# Patient Record
Sex: Female | Born: 1950 | Race: White | Hispanic: No | Marital: Married | Smoking: Former smoker
Health system: Southern US, Community
[De-identification: ages and names within clinical notes are randomized; demographics above are authoritative.]

## PROBLEM LIST (undated history)

## (undated) DIAGNOSIS — M545 Low back pain, unspecified: Secondary | ICD-10-CM

## (undated) DIAGNOSIS — E7800 Pure hypercholesterolemia, unspecified: Secondary | ICD-10-CM

## (undated) DIAGNOSIS — F319 Bipolar disorder, unspecified: Secondary | ICD-10-CM

## (undated) DIAGNOSIS — T8859XA Other complications of anesthesia, initial encounter: Secondary | ICD-10-CM

## (undated) DIAGNOSIS — Z720 Tobacco use: Secondary | ICD-10-CM

## (undated) DIAGNOSIS — R0602 Shortness of breath: Secondary | ICD-10-CM

## (undated) DIAGNOSIS — G43909 Migraine, unspecified, not intractable, without status migrainosus: Secondary | ICD-10-CM

## (undated) DIAGNOSIS — Z9889 Other specified postprocedural states: Secondary | ICD-10-CM

## (undated) DIAGNOSIS — R112 Nausea with vomiting, unspecified: Secondary | ICD-10-CM

## (undated) DIAGNOSIS — T7840XA Allergy, unspecified, initial encounter: Secondary | ICD-10-CM

## (undated) DIAGNOSIS — H269 Unspecified cataract: Secondary | ICD-10-CM

## (undated) DIAGNOSIS — F32A Depression, unspecified: Secondary | ICD-10-CM

## (undated) DIAGNOSIS — IMO0001 Reserved for inherently not codable concepts without codable children: Secondary | ICD-10-CM

## (undated) DIAGNOSIS — Z5189 Encounter for other specified aftercare: Secondary | ICD-10-CM

## (undated) DIAGNOSIS — K219 Gastro-esophageal reflux disease without esophagitis: Secondary | ICD-10-CM

## (undated) DIAGNOSIS — C801 Malignant (primary) neoplasm, unspecified: Secondary | ICD-10-CM

## (undated) DIAGNOSIS — F329 Major depressive disorder, single episode, unspecified: Secondary | ICD-10-CM

## (undated) DIAGNOSIS — Z923 Personal history of irradiation: Secondary | ICD-10-CM

## (undated) DIAGNOSIS — J449 Chronic obstructive pulmonary disease, unspecified: Secondary | ICD-10-CM

## (undated) DIAGNOSIS — M1990 Unspecified osteoarthritis, unspecified site: Secondary | ICD-10-CM

## (undated) DIAGNOSIS — S42202A Unspecified fracture of upper end of left humerus, initial encounter for closed fracture: Secondary | ICD-10-CM

## (undated) DIAGNOSIS — J45909 Unspecified asthma, uncomplicated: Secondary | ICD-10-CM

## (undated) DIAGNOSIS — C3490 Malignant neoplasm of unspecified part of unspecified bronchus or lung: Secondary | ICD-10-CM

## (undated) DIAGNOSIS — E785 Hyperlipidemia, unspecified: Secondary | ICD-10-CM

## (undated) DIAGNOSIS — F419 Anxiety disorder, unspecified: Secondary | ICD-10-CM

## (undated) DIAGNOSIS — T4145XA Adverse effect of unspecified anesthetic, initial encounter: Secondary | ICD-10-CM

## (undated) HISTORY — DX: Anxiety disorder, unspecified: F41.9

## (undated) HISTORY — PX: VESICOVAGINAL FISTULA CLOSURE W/ TAH: SUR271

## (undated) HISTORY — DX: Depression, unspecified: F32.A

## (undated) HISTORY — DX: Low back pain, unspecified: M54.50

## (undated) HISTORY — PX: LUMBAR FUSION: SHX111

## (undated) HISTORY — DX: Bipolar disorder, unspecified: F31.9

## (undated) HISTORY — DX: Low back pain: M54.5

## (undated) HISTORY — PX: APPENDECTOMY: SHX54

## (undated) HISTORY — DX: Chronic obstructive pulmonary disease, unspecified: J44.9

## (undated) HISTORY — DX: Pure hypercholesterolemia, unspecified: E78.00

## (undated) HISTORY — DX: Migraine, unspecified, not intractable, without status migrainosus: G43.909

## (undated) HISTORY — DX: Tobacco use: Z72.0

## (undated) HISTORY — PX: TONSILLECTOMY: SUR1361

## (undated) HISTORY — PX: BACK SURGERY: SHX140

## (undated) HISTORY — DX: Hyperlipidemia, unspecified: E78.5

## (undated) HISTORY — DX: Malignant neoplasm of unspecified part of unspecified bronchus or lung: C34.90

## (undated) HISTORY — PX: ABDOMINAL HYSTERECTOMY: SHX81

## (undated) HISTORY — DX: Reserved for inherently not codable concepts without codable children: IMO0001

## (undated) HISTORY — DX: Major depressive disorder, single episode, unspecified: F32.9

---

## 1998-01-09 ENCOUNTER — Ambulatory Visit (HOSPITAL_COMMUNITY): Admission: RE | Admit: 1998-01-09 | Discharge: 1998-01-09 | Payer: Self-pay | Admitting: Family Medicine

## 1998-01-25 ENCOUNTER — Inpatient Hospital Stay (HOSPITAL_COMMUNITY): Admission: AD | Admit: 1998-01-25 | Discharge: 1998-02-01 | Payer: Self-pay | Admitting: *Deleted

## 1998-07-04 ENCOUNTER — Encounter: Payer: Self-pay | Admitting: Family Medicine

## 1998-07-04 ENCOUNTER — Ambulatory Visit (HOSPITAL_COMMUNITY): Admission: RE | Admit: 1998-07-04 | Discharge: 1998-07-04 | Payer: Self-pay | Admitting: Family Medicine

## 1998-09-25 ENCOUNTER — Encounter: Payer: Self-pay | Admitting: Orthopedic Surgery

## 1998-09-27 ENCOUNTER — Encounter: Payer: Self-pay | Admitting: Orthopedic Surgery

## 1998-09-27 ENCOUNTER — Observation Stay (HOSPITAL_COMMUNITY): Admission: RE | Admit: 1998-09-27 | Discharge: 1998-09-29 | Payer: Self-pay | Admitting: Orthopedic Surgery

## 1999-02-12 ENCOUNTER — Encounter: Payer: Self-pay | Admitting: Orthopedic Surgery

## 1999-02-13 ENCOUNTER — Inpatient Hospital Stay (HOSPITAL_COMMUNITY): Admission: EM | Admit: 1999-02-13 | Discharge: 1999-02-14 | Payer: Self-pay | Admitting: Orthopedic Surgery

## 1999-06-10 ENCOUNTER — Encounter: Payer: Self-pay | Admitting: Orthopedic Surgery

## 1999-06-10 ENCOUNTER — Ambulatory Visit (HOSPITAL_COMMUNITY): Admission: RE | Admit: 1999-06-10 | Discharge: 1999-06-10 | Payer: Self-pay | Admitting: Orthopedic Surgery

## 1999-09-18 ENCOUNTER — Ambulatory Visit (HOSPITAL_COMMUNITY): Admission: RE | Admit: 1999-09-18 | Discharge: 1999-09-18 | Payer: Self-pay | Admitting: Family Medicine

## 1999-09-18 ENCOUNTER — Encounter: Payer: Self-pay | Admitting: Family Medicine

## 2000-07-15 ENCOUNTER — Ambulatory Visit (HOSPITAL_COMMUNITY): Admission: RE | Admit: 2000-07-15 | Discharge: 2000-07-15 | Payer: Self-pay | Admitting: Family Medicine

## 2000-07-15 ENCOUNTER — Encounter: Payer: Self-pay | Admitting: Family Medicine

## 2000-07-16 ENCOUNTER — Encounter: Admission: RE | Admit: 2000-07-16 | Discharge: 2000-07-16 | Payer: Self-pay | Admitting: Family Medicine

## 2000-07-16 ENCOUNTER — Encounter: Payer: Self-pay | Admitting: Family Medicine

## 2001-03-23 ENCOUNTER — Encounter: Payer: Self-pay | Admitting: Physical Medicine and Rehabilitation

## 2001-03-23 ENCOUNTER — Encounter
Admission: RE | Admit: 2001-03-23 | Discharge: 2001-03-23 | Payer: Self-pay | Admitting: Physical Medicine and Rehabilitation

## 2001-04-15 ENCOUNTER — Ambulatory Visit: Admission: RE | Admit: 2001-04-15 | Discharge: 2001-04-15 | Payer: Self-pay | Admitting: Orthopaedic Surgery

## 2001-04-15 ENCOUNTER — Encounter: Payer: Self-pay | Admitting: Orthopaedic Surgery

## 2001-04-22 ENCOUNTER — Inpatient Hospital Stay (HOSPITAL_COMMUNITY): Admission: RE | Admit: 2001-04-22 | Discharge: 2001-05-03 | Payer: Self-pay | Admitting: Orthopaedic Surgery

## 2001-04-22 ENCOUNTER — Encounter: Payer: Self-pay | Admitting: Orthopaedic Surgery

## 2001-04-23 ENCOUNTER — Encounter: Payer: Self-pay | Admitting: Internal Medicine

## 2001-04-24 ENCOUNTER — Encounter: Payer: Self-pay | Admitting: Internal Medicine

## 2001-04-26 ENCOUNTER — Encounter: Payer: Self-pay | Admitting: Orthopaedic Surgery

## 2001-04-27 ENCOUNTER — Encounter: Payer: Self-pay | Admitting: Orthopaedic Surgery

## 2005-08-04 ENCOUNTER — Ambulatory Visit (HOSPITAL_COMMUNITY): Admission: RE | Admit: 2005-08-04 | Discharge: 2005-08-04 | Payer: Self-pay | Admitting: Family Medicine

## 2005-09-09 ENCOUNTER — Encounter: Admission: RE | Admit: 2005-09-09 | Discharge: 2005-09-09 | Payer: Self-pay | Admitting: Orthopaedic Surgery

## 2006-06-07 ENCOUNTER — Inpatient Hospital Stay (HOSPITAL_COMMUNITY): Admission: EM | Admit: 2006-06-07 | Discharge: 2006-06-12 | Payer: Self-pay | Admitting: Emergency Medicine

## 2007-05-13 ENCOUNTER — Emergency Department (HOSPITAL_COMMUNITY): Admission: EM | Admit: 2007-05-13 | Discharge: 2007-05-14 | Payer: Self-pay | Admitting: Podiatry

## 2009-01-11 ENCOUNTER — Encounter: Admission: RE | Admit: 2009-01-11 | Discharge: 2009-01-11 | Payer: Self-pay | Admitting: Orthopaedic Surgery

## 2009-06-29 HISTORY — PX: LUNG BIOPSY: SHX232

## 2009-06-29 HISTORY — PX: BRONCHOSCOPY: SUR163

## 2009-11-28 ENCOUNTER — Encounter: Payer: Self-pay | Admitting: Internal Medicine

## 2009-12-04 ENCOUNTER — Encounter: Payer: Self-pay | Admitting: Internal Medicine

## 2009-12-04 ENCOUNTER — Encounter: Admission: RE | Admit: 2009-12-04 | Discharge: 2009-12-04 | Payer: Self-pay | Admitting: Family Medicine

## 2009-12-06 DIAGNOSIS — L509 Urticaria, unspecified: Secondary | ICD-10-CM | POA: Insufficient documentation

## 2009-12-06 DIAGNOSIS — M545 Low back pain, unspecified: Secondary | ICD-10-CM | POA: Insufficient documentation

## 2009-12-06 DIAGNOSIS — F411 Generalized anxiety disorder: Secondary | ICD-10-CM | POA: Insufficient documentation

## 2009-12-06 DIAGNOSIS — F339 Major depressive disorder, recurrent, unspecified: Secondary | ICD-10-CM | POA: Insufficient documentation

## 2009-12-06 DIAGNOSIS — J441 Chronic obstructive pulmonary disease with (acute) exacerbation: Secondary | ICD-10-CM

## 2009-12-06 DIAGNOSIS — IMO0001 Reserved for inherently not codable concepts without codable children: Secondary | ICD-10-CM | POA: Insufficient documentation

## 2009-12-06 DIAGNOSIS — E7800 Pure hypercholesterolemia, unspecified: Secondary | ICD-10-CM | POA: Insufficient documentation

## 2009-12-06 DIAGNOSIS — E785 Hyperlipidemia, unspecified: Secondary | ICD-10-CM | POA: Insufficient documentation

## 2009-12-06 DIAGNOSIS — G43909 Migraine, unspecified, not intractable, without status migrainosus: Secondary | ICD-10-CM | POA: Insufficient documentation

## 2009-12-09 ENCOUNTER — Ambulatory Visit: Payer: Self-pay | Admitting: Internal Medicine

## 2009-12-09 DIAGNOSIS — R222 Localized swelling, mass and lump, trunk: Secondary | ICD-10-CM | POA: Insufficient documentation

## 2009-12-09 DIAGNOSIS — R042 Hemoptysis: Secondary | ICD-10-CM | POA: Insufficient documentation

## 2009-12-09 LAB — CONVERTED CEMR LAB
BUN: 19 mg/dL (ref 6–23)
Basophils Absolute: 0.1 10*3/uL (ref 0.0–0.1)
Basophils Relative: 1.1 % (ref 0.0–3.0)
Chloride: 103 meq/L (ref 96–112)
Eosinophils Absolute: 0.2 10*3/uL (ref 0.0–0.7)
GFR calc non Af Amer: 61.24 mL/min (ref >60)
Lymphocytes Relative: 39.8 % (ref 12.0–46.0)
MCHC: 34.5 g/dL (ref 30.0–36.0)
MCV: 94 fL (ref 78.0–100.0)
Monocytes Absolute: 0.4 10*3/uL (ref 0.1–1.0)
Neutrophils Relative %: 50 % (ref 43.0–77.0)
Platelets: 452 10*3/uL — ABNORMAL HIGH (ref 150.0–400.0)
Potassium: 5.2 meq/L — ABNORMAL HIGH (ref 3.5–5.1)
Prothrombin Time: 10.8 s (ref 9.7–11.8)
RDW: 14.2 % (ref 11.5–14.6)
aPTT: 32.6 s — ABNORMAL HIGH (ref 21.7–28.8)

## 2009-12-10 ENCOUNTER — Ambulatory Visit: Admission: RE | Admit: 2009-12-10 | Discharge: 2009-12-10 | Payer: Self-pay | Admitting: Internal Medicine

## 2009-12-10 ENCOUNTER — Ambulatory Visit: Payer: Self-pay | Admitting: Internal Medicine

## 2009-12-12 ENCOUNTER — Telehealth (INDEPENDENT_AMBULATORY_CARE_PROVIDER_SITE_OTHER): Payer: Self-pay | Admitting: *Deleted

## 2009-12-12 ENCOUNTER — Telehealth: Payer: Self-pay | Admitting: Internal Medicine

## 2009-12-13 ENCOUNTER — Telehealth: Payer: Self-pay | Admitting: Internal Medicine

## 2009-12-23 ENCOUNTER — Ambulatory Visit (HOSPITAL_COMMUNITY): Admission: RE | Admit: 2009-12-23 | Discharge: 2009-12-23 | Payer: Self-pay | Admitting: Internal Medicine

## 2009-12-25 ENCOUNTER — Telehealth (INDEPENDENT_AMBULATORY_CARE_PROVIDER_SITE_OTHER): Payer: Self-pay | Admitting: *Deleted

## 2009-12-26 ENCOUNTER — Ambulatory Visit: Payer: Self-pay | Admitting: Internal Medicine

## 2009-12-26 ENCOUNTER — Telehealth (INDEPENDENT_AMBULATORY_CARE_PROVIDER_SITE_OTHER): Payer: Self-pay | Admitting: *Deleted

## 2009-12-27 ENCOUNTER — Ambulatory Visit: Payer: Self-pay | Admitting: Internal Medicine

## 2009-12-31 ENCOUNTER — Telehealth: Payer: Self-pay | Admitting: Internal Medicine

## 2009-12-31 ENCOUNTER — Ambulatory Visit (HOSPITAL_COMMUNITY): Admission: RE | Admit: 2009-12-31 | Discharge: 2009-12-31 | Payer: Self-pay | Admitting: Internal Medicine

## 2010-01-02 ENCOUNTER — Encounter: Payer: Self-pay | Admitting: Internal Medicine

## 2010-01-02 ENCOUNTER — Telehealth (INDEPENDENT_AMBULATORY_CARE_PROVIDER_SITE_OTHER): Payer: Self-pay | Admitting: *Deleted

## 2010-01-02 ENCOUNTER — Ambulatory Visit: Payer: Self-pay | Admitting: Cardiothoracic Surgery

## 2010-01-02 ENCOUNTER — Ambulatory Visit: Payer: Self-pay | Admitting: Internal Medicine

## 2010-01-07 ENCOUNTER — Ambulatory Visit: Admission: RE | Admit: 2010-01-07 | Discharge: 2010-03-06 | Payer: Self-pay | Admitting: Radiation Oncology

## 2010-01-07 LAB — COMPREHENSIVE METABOLIC PANEL
Alkaline Phosphatase: 81 U/L (ref 39–117)
Glucose, Bld: 83 mg/dL (ref 70–99)
Sodium: 137 mEq/L (ref 135–145)
Total Bilirubin: 0.4 mg/dL (ref 0.3–1.2)
Total Protein: 7.1 g/dL (ref 6.0–8.3)

## 2010-01-07 LAB — CBC WITH DIFFERENTIAL/PLATELET
BASO%: 0.5 % (ref 0.0–2.0)
Eosinophils Absolute: 0.3 10*3/uL (ref 0.0–0.5)
HCT: 35 % (ref 34.8–46.6)
HGB: 12.2 g/dL (ref 11.6–15.9)
LYMPH%: 30.5 % (ref 14.0–49.7)
MCH: 32.8 pg (ref 25.1–34.0)
MCHC: 34.9 g/dL (ref 31.5–36.0)
MCV: 93.9 fL (ref 79.5–101.0)
MONO%: 7.8 % (ref 0.0–14.0)
NEUT#: 5.3 10*3/uL (ref 1.5–6.5)
RBC: 3.72 10*6/uL (ref 3.70–5.45)
WBC: 9.2 10*3/uL (ref 3.9–10.3)

## 2010-01-08 ENCOUNTER — Ambulatory Visit (HOSPITAL_COMMUNITY): Admission: RE | Admit: 2010-01-08 | Discharge: 2010-01-08 | Payer: Self-pay | Admitting: Internal Medicine

## 2010-01-13 ENCOUNTER — Encounter: Payer: Self-pay | Admitting: Internal Medicine

## 2010-01-13 LAB — CBC WITH DIFFERENTIAL/PLATELET
Basophils Absolute: 0.1 10*3/uL (ref 0.0–0.1)
Eosinophils Absolute: 0.3 10*3/uL (ref 0.0–0.5)
HGB: 12.1 g/dL (ref 11.6–15.9)
MCV: 95.1 fL (ref 79.5–101.0)
MONO#: 0.7 10*3/uL (ref 0.1–0.9)
NEUT#: 4.2 10*3/uL (ref 1.5–6.5)
RDW: 13.3 % (ref 11.2–14.5)
WBC: 8.2 10*3/uL (ref 3.9–10.3)
lymph#: 3 10*3/uL (ref 0.9–3.3)

## 2010-01-13 LAB — COMPREHENSIVE METABOLIC PANEL
Albumin: 4.2 g/dL (ref 3.5–5.2)
Alkaline Phosphatase: 86 U/L (ref 39–117)
BUN: 11 mg/dL (ref 6–23)
Calcium: 9.7 mg/dL (ref 8.4–10.5)
Glucose, Bld: 77 mg/dL (ref 70–99)
Potassium: 4.5 mEq/L (ref 3.5–5.3)

## 2010-01-20 LAB — CBC WITH DIFFERENTIAL/PLATELET
Basophils Absolute: 0.1 10*3/uL (ref 0.0–0.1)
Eosinophils Absolute: 0.2 10*3/uL (ref 0.0–0.5)
HCT: 30.9 % — ABNORMAL LOW (ref 34.8–46.6)
HGB: 10.8 g/dL — ABNORMAL LOW (ref 11.6–15.9)
LYMPH%: 26.4 % (ref 14.0–49.7)
MCV: 92.7 fL (ref 79.5–101.0)
MONO%: 5.2 % (ref 0.0–14.0)
NEUT#: 3.4 10*3/uL (ref 1.5–6.5)
Platelets: 395 10*3/uL (ref 145–400)

## 2010-01-20 LAB — COMPREHENSIVE METABOLIC PANEL
Albumin: 3.7 g/dL (ref 3.5–5.2)
Alkaline Phosphatase: 79 U/L (ref 39–117)
BUN: 20 mg/dL (ref 6–23)
CO2: 23 mEq/L (ref 19–32)
Glucose, Bld: 84 mg/dL (ref 70–99)
Potassium: 4.5 mEq/L (ref 3.5–5.3)
Total Bilirubin: 0.3 mg/dL (ref 0.3–1.2)

## 2010-01-27 ENCOUNTER — Ambulatory Visit: Payer: Self-pay | Admitting: Internal Medicine

## 2010-01-27 ENCOUNTER — Encounter: Payer: Self-pay | Admitting: Internal Medicine

## 2010-01-27 LAB — CBC WITH DIFFERENTIAL/PLATELET
BASO%: 0.9 % (ref 0.0–2.0)
Eosinophils Absolute: 0.2 10*3/uL (ref 0.0–0.5)
HCT: 34.1 % — ABNORMAL LOW (ref 34.8–46.6)
MCHC: 34 g/dL (ref 31.5–36.0)
MONO#: 0.4 10*3/uL (ref 0.1–0.9)
NEUT#: 3.4 10*3/uL (ref 1.5–6.5)
RBC: 3.7 10*6/uL (ref 3.70–5.45)
WBC: 5.3 10*3/uL (ref 3.9–10.3)
lymph#: 1.3 10*3/uL (ref 0.9–3.3)

## 2010-01-27 LAB — COMPREHENSIVE METABOLIC PANEL
ALT: 8 U/L (ref 0–35)
Albumin: 3.9 g/dL (ref 3.5–5.2)
CO2: 22 mEq/L (ref 19–32)
Calcium: 9 mg/dL (ref 8.4–10.5)
Chloride: 101 mEq/L (ref 96–112)
Sodium: 132 mEq/L — ABNORMAL LOW (ref 135–145)
Total Protein: 6.7 g/dL (ref 6.0–8.3)

## 2010-02-03 LAB — CBC WITH DIFFERENTIAL/PLATELET
BASO%: 0.8 % (ref 0.0–2.0)
LYMPH%: 23.5 % (ref 14.0–49.7)
MCHC: 34.1 g/dL (ref 31.5–36.0)
MONO#: 0.9 10*3/uL (ref 0.1–0.9)
RBC: 3.41 10*6/uL — ABNORMAL LOW (ref 3.70–5.45)
RDW: 13.5 % (ref 11.2–14.5)
WBC: 6.2 10*3/uL (ref 3.9–10.3)
lymph#: 1.5 10*3/uL (ref 0.9–3.3)
nRBC: 0 % (ref 0–0)

## 2010-02-03 LAB — COMPREHENSIVE METABOLIC PANEL
ALT: <8 U/L (ref 0–35)
AST: 8 U/L (ref 0–37)
Calcium: 9.2 mg/dL (ref 8.4–10.5)
Chloride: 93 mEq/L — ABNORMAL LOW (ref 96–112)
Creatinine, Ser: 0.88 mg/dL (ref 0.40–1.20)
Potassium: 4.2 mEq/L (ref 3.5–5.3)
Sodium: 127 mEq/L — ABNORMAL LOW (ref 135–145)

## 2010-02-05 ENCOUNTER — Ambulatory Visit (HOSPITAL_COMMUNITY): Admission: RE | Admit: 2010-02-05 | Discharge: 2010-02-05 | Payer: Self-pay | Admitting: Internal Medicine

## 2010-02-10 ENCOUNTER — Encounter: Payer: Self-pay | Admitting: Internal Medicine

## 2010-02-10 LAB — CBC WITH DIFFERENTIAL/PLATELET
BASO%: 0.4 % (ref 0.0–2.0)
EOS%: 0.6 % (ref 0.0–7.0)
HCT: 32.4 % — ABNORMAL LOW (ref 34.8–46.6)
LYMPH%: 16 % (ref 14.0–49.7)
MCH: 31.3 pg (ref 25.1–34.0)
MCHC: 33.6 g/dL (ref 31.5–36.0)
NEUT%: 70.1 % (ref 38.4–76.8)
lymph#: 1.1 10*3/uL (ref 0.9–3.3)

## 2010-02-10 LAB — COMPREHENSIVE METABOLIC PANEL
ALT: 8 U/L (ref 0–35)
AST: 10 U/L (ref 0–37)
Alkaline Phosphatase: 75 U/L (ref 39–117)
Creatinine, Ser: 0.78 mg/dL (ref 0.40–1.20)
Total Bilirubin: 1 mg/dL (ref 0.3–1.2)

## 2010-02-17 LAB — COMPREHENSIVE METABOLIC PANEL
ALT: <8 U/L (ref 0–35)
AST: 13 U/L (ref 0–37)
BUN: 8 mg/dL (ref 6–23)
CO2: 28 mEq/L (ref 19–32)
Creatinine, Ser: 0.8 mg/dL (ref 0.40–1.20)
Total Bilirubin: 0.5 mg/dL (ref 0.3–1.2)

## 2010-02-17 LAB — CBC WITH DIFFERENTIAL/PLATELET
BASO%: 2.2 % — ABNORMAL HIGH (ref 0.0–2.0)
EOS%: 1.5 % (ref 0.0–7.0)
HCT: 31.8 % — ABNORMAL LOW (ref 34.8–46.6)
LYMPH%: 22.5 % (ref 14.0–49.7)
MCH: 32.4 pg (ref 25.1–34.0)
MCHC: 34.3 g/dL (ref 31.5–36.0)
NEUT%: 63.7 % (ref 38.4–76.8)
Platelets: 309 10*3/uL (ref 145–400)

## 2010-02-24 LAB — COMPREHENSIVE METABOLIC PANEL
CO2: 24 mEq/L (ref 19–32)
Calcium: 8.6 mg/dL (ref 8.4–10.5)
Chloride: 100 mEq/L (ref 96–112)
Glucose, Bld: 79 mg/dL (ref 70–99)
Sodium: 135 mEq/L (ref 135–145)
Total Bilirubin: 0.3 mg/dL (ref 0.3–1.2)
Total Protein: 6.5 g/dL (ref 6.0–8.3)

## 2010-02-24 LAB — CBC WITH DIFFERENTIAL/PLATELET
BASO%: 0.4 % (ref 0.0–2.0)
Eosinophils Absolute: 0.1 10*3/uL (ref 0.0–0.5)
MCHC: 33 g/dL (ref 31.5–36.0)
MONO#: 0.6 10*3/uL (ref 0.1–0.9)
NEUT#: 4.3 10*3/uL (ref 1.5–6.5)
Platelets: 323 10*3/uL (ref 145–400)
RBC: 3.31 10*6/uL — ABNORMAL LOW (ref 3.70–5.45)
RDW: 16 % — ABNORMAL HIGH (ref 11.2–14.5)
WBC: 5.7 10*3/uL (ref 3.9–10.3)
lymph#: 0.7 10*3/uL — ABNORMAL LOW (ref 0.9–3.3)
nRBC: 0 % (ref 0–0)

## 2010-02-28 ENCOUNTER — Ambulatory Visit: Payer: Self-pay | Admitting: Internal Medicine

## 2010-02-28 ENCOUNTER — Encounter: Payer: Self-pay | Admitting: Internal Medicine

## 2010-03-25 ENCOUNTER — Ambulatory Visit (HOSPITAL_COMMUNITY): Admission: RE | Admit: 2010-03-25 | Discharge: 2010-03-25 | Payer: Self-pay | Admitting: Internal Medicine

## 2010-03-25 LAB — COMPREHENSIVE METABOLIC PANEL
ALT: 12 U/L (ref 0–35)
CO2: 22 mEq/L (ref 19–32)
Calcium: 9 mg/dL (ref 8.4–10.5)
Chloride: 103 mEq/L (ref 96–112)
Creatinine, Ser: 0.64 mg/dL (ref 0.40–1.20)
Glucose, Bld: 74 mg/dL (ref 70–99)
Total Protein: 6.7 g/dL (ref 6.0–8.3)

## 2010-03-25 LAB — CBC WITH DIFFERENTIAL/PLATELET
BASO%: 0.2 % (ref 0.0–2.0)
Basophils Absolute: 0 10*3/uL (ref 0.0–0.1)
Eosinophils Absolute: 0.1 10*3/uL (ref 0.0–0.5)
HCT: 32.5 % — ABNORMAL LOW (ref 34.8–46.6)
HGB: 11 g/dL — ABNORMAL LOW (ref 11.6–15.9)
LYMPH%: 21.7 % (ref 14.0–49.7)
MCHC: 34 g/dL (ref 31.5–36.0)
MONO#: 0.3 10*3/uL (ref 0.1–0.9)
NEUT#: 2.4 10*3/uL (ref 1.5–6.5)
NEUT%: 67.1 % (ref 38.4–76.8)
Platelets: 489 10*3/uL — ABNORMAL HIGH (ref 145–400)
WBC: 3.5 10*3/uL — ABNORMAL LOW (ref 3.9–10.3)
lymph#: 0.8 10*3/uL — ABNORMAL LOW (ref 0.9–3.3)

## 2010-03-31 ENCOUNTER — Encounter: Payer: Self-pay | Admitting: Internal Medicine

## 2010-04-03 ENCOUNTER — Encounter: Payer: Self-pay | Admitting: Internal Medicine

## 2010-04-03 ENCOUNTER — Ambulatory Visit: Payer: Self-pay | Admitting: Internal Medicine

## 2010-04-03 ENCOUNTER — Ambulatory Visit (HOSPITAL_COMMUNITY): Admission: RE | Admit: 2010-04-03 | Discharge: 2010-04-03 | Payer: Self-pay | Admitting: Radiation Oncology

## 2010-04-07 LAB — COMPREHENSIVE METABOLIC PANEL
ALT: 8 U/L (ref 0–35)
CO2: 27 mEq/L (ref 19–32)
Chloride: 102 mEq/L (ref 96–112)
Sodium: 138 mEq/L (ref 135–145)
Total Bilirubin: 0.8 mg/dL (ref 0.3–1.2)
Total Protein: 6.8 g/dL (ref 6.0–8.3)

## 2010-04-07 LAB — CBC WITH DIFFERENTIAL/PLATELET
BASO%: 0.2 % (ref 0.0–2.0)
LYMPH%: 13.5 % — ABNORMAL LOW (ref 14.0–49.7)
MCHC: 32.7 g/dL (ref 31.5–36.0)
MONO#: 0.3 10*3/uL (ref 0.1–0.9)
RBC: 3.41 10*6/uL — ABNORMAL LOW (ref 3.70–5.45)
WBC: 6.1 10*3/uL (ref 3.9–10.3)
lymph#: 0.8 10*3/uL — ABNORMAL LOW (ref 0.9–3.3)

## 2010-04-14 LAB — CBC WITH DIFFERENTIAL/PLATELET
BASO%: 0.7 % (ref 0.0–2.0)
EOS%: 10.6 % — ABNORMAL HIGH (ref 0.0–7.0)
LYMPH%: 34.4 % (ref 14.0–49.7)
MCH: 33.4 pg (ref 25.1–34.0)
MCHC: 34.4 g/dL (ref 31.5–36.0)
MONO#: 0.1 10*3/uL (ref 0.1–0.9)
MONO%: 4.5 % (ref 0.0–14.0)
Platelets: 292 10*3/uL (ref 145–400)
RBC: 3.06 10*6/uL — ABNORMAL LOW (ref 3.70–5.45)
WBC: 2.3 10*3/uL — ABNORMAL LOW (ref 3.9–10.3)

## 2010-04-14 LAB — COMPREHENSIVE METABOLIC PANEL
ALT: 10 U/L (ref 0–35)
AST: 14 U/L (ref 0–37)
Alkaline Phosphatase: 83 U/L (ref 39–117)
CO2: 30 mEq/L (ref 19–32)
Sodium: 139 mEq/L (ref 135–145)
Total Bilirubin: 0.8 mg/dL (ref 0.3–1.2)
Total Protein: 7 g/dL (ref 6.0–8.3)

## 2010-04-21 LAB — CBC WITH DIFFERENTIAL/PLATELET
BASO%: 0.3 % (ref 0.0–2.0)
HCT: 29 % — ABNORMAL LOW (ref 34.8–46.6)
LYMPH%: 34 % (ref 14.0–49.7)
MCHC: 35.2 g/dL (ref 31.5–36.0)
MCV: 96.6 fL (ref 79.5–101.0)
MONO%: 15.4 % — ABNORMAL HIGH (ref 0.0–14.0)
NEUT%: 45.1 % (ref 38.4–76.8)
Platelets: 187 10*3/uL (ref 145–400)
RBC: 3 10*6/uL — ABNORMAL LOW (ref 3.70–5.45)

## 2010-04-21 LAB — COMPREHENSIVE METABOLIC PANEL
ALT: 13 U/L (ref 0–35)
Alkaline Phosphatase: 98 U/L (ref 39–117)
Creatinine, Ser: 1.01 mg/dL (ref 0.40–1.20)
Glucose, Bld: 72 mg/dL (ref 70–99)
Sodium: 139 mEq/L (ref 135–145)
Total Bilirubin: 0.6 mg/dL (ref 0.3–1.2)
Total Protein: 6.9 g/dL (ref 6.0–8.3)

## 2010-04-28 ENCOUNTER — Encounter: Payer: Self-pay | Admitting: Internal Medicine

## 2010-04-28 LAB — CBC WITH DIFFERENTIAL/PLATELET
Eosinophils Absolute: 0.1 10*3/uL (ref 0.0–0.5)
HCT: 30.1 % — ABNORMAL LOW (ref 34.8–46.6)
LYMPH%: 30.1 % (ref 14.0–49.7)
MCV: 99.7 fL (ref 79.5–101.0)
MONO#: 0.7 10*3/uL (ref 0.1–0.9)
MONO%: 16.6 % — ABNORMAL HIGH (ref 0.0–14.0)
NEUT#: 2.1 10*3/uL (ref 1.5–6.5)
NEUT%: 49.8 % (ref 38.4–76.8)
Platelets: 411 10*3/uL — ABNORMAL HIGH (ref 145–400)
RBC: 3.02 10*6/uL — ABNORMAL LOW (ref 3.70–5.45)
WBC: 4.3 10*3/uL (ref 3.9–10.3)
nRBC: 0 % (ref 0–0)

## 2010-04-30 LAB — COMPREHENSIVE METABOLIC PANEL
BUN: 10 mg/dL (ref 6–23)
CO2: 25 mEq/L (ref 19–32)
Calcium: 9 mg/dL (ref 8.4–10.5)
Chloride: 103 mEq/L (ref 96–112)
Creatinine, Ser: 0.82 mg/dL (ref 0.40–1.20)
Glucose, Bld: 99 mg/dL (ref 70–99)
Total Bilirubin: 0.3 mg/dL (ref 0.3–1.2)

## 2010-05-08 ENCOUNTER — Ambulatory Visit: Payer: Self-pay | Admitting: Internal Medicine

## 2010-05-12 ENCOUNTER — Ambulatory Visit (HOSPITAL_COMMUNITY): Admission: RE | Admit: 2010-05-12 | Discharge: 2010-05-12 | Payer: Self-pay | Admitting: Internal Medicine

## 2010-05-12 ENCOUNTER — Encounter: Payer: Self-pay | Admitting: Internal Medicine

## 2010-05-12 LAB — CBC WITH DIFFERENTIAL/PLATELET
Basophils Absolute: 0 10*3/uL (ref 0.0–0.1)
Eosinophils Absolute: 0 10*3/uL (ref 0.0–0.5)
HGB: 8.4 g/dL — ABNORMAL LOW (ref 11.6–15.9)
MONO#: 0.9 10*3/uL (ref 0.1–0.9)
MONO%: 28.8 % — ABNORMAL HIGH (ref 0.0–14.0)
NEUT#: 1.2 10*3/uL — ABNORMAL LOW (ref 1.5–6.5)
RBC: 2.62 10*6/uL — ABNORMAL LOW (ref 3.70–5.45)
RDW: 13.4 % (ref 11.2–14.5)
WBC: 3 10*3/uL — ABNORMAL LOW (ref 3.9–10.3)
lymph#: 0.9 10*3/uL (ref 0.9–3.3)

## 2010-05-12 LAB — COMPREHENSIVE METABOLIC PANEL
Albumin: 2.9 g/dL — ABNORMAL LOW (ref 3.5–5.2)
Alkaline Phosphatase: 53 U/L (ref 39–117)
BUN: 7 mg/dL (ref 6–23)
Calcium: 8.8 mg/dL (ref 8.4–10.5)
Chloride: 95 mEq/L — ABNORMAL LOW (ref 96–112)
Glucose, Bld: 99 mg/dL (ref 70–99)
Potassium: 3.4 mEq/L — ABNORMAL LOW (ref 3.5–5.3)
Sodium: 134 mEq/L — ABNORMAL LOW (ref 135–145)
Total Protein: 6.7 g/dL (ref 6.0–8.3)

## 2010-05-16 ENCOUNTER — Inpatient Hospital Stay (HOSPITAL_COMMUNITY): Admission: EM | Admit: 2010-05-16 | Discharge: 2010-05-23 | Payer: Self-pay | Admitting: Emergency Medicine

## 2010-05-19 ENCOUNTER — Ambulatory Visit: Payer: Self-pay | Admitting: Internal Medicine

## 2010-05-20 ENCOUNTER — Encounter (INDEPENDENT_AMBULATORY_CARE_PROVIDER_SITE_OTHER): Payer: Self-pay | Admitting: Internal Medicine

## 2010-05-20 DIAGNOSIS — F39 Unspecified mood [affective] disorder: Secondary | ICD-10-CM

## 2010-06-09 ENCOUNTER — Encounter: Payer: Self-pay | Admitting: Internal Medicine

## 2010-06-09 ENCOUNTER — Ambulatory Visit: Payer: Self-pay | Admitting: Internal Medicine

## 2010-06-09 LAB — CBC WITH DIFFERENTIAL/PLATELET
Basophils Absolute: 0 10*3/uL (ref 0.0–0.1)
Eosinophils Absolute: 0.2 10*3/uL (ref 0.0–0.5)
HGB: 12 g/dL (ref 11.6–15.9)
MCV: 93.6 fL (ref 79.5–101.0)
MONO#: 0.8 10*3/uL (ref 0.1–0.9)
MONO%: 7.2 % (ref 0.0–14.0)
NEUT#: 9 10*3/uL — ABNORMAL HIGH (ref 1.5–6.5)
RDW: 15.9 % — ABNORMAL HIGH (ref 11.2–14.5)
WBC: 11.2 10*3/uL — ABNORMAL HIGH (ref 3.9–10.3)

## 2010-06-09 LAB — COMPREHENSIVE METABOLIC PANEL
Albumin: 4 g/dL (ref 3.5–5.2)
Alkaline Phosphatase: 116 U/L (ref 39–117)
BUN: 17 mg/dL (ref 6–23)
CO2: 26 mEq/L (ref 19–32)
Calcium: 9.7 mg/dL (ref 8.4–10.5)
Chloride: 98 mEq/L (ref 96–112)
Glucose, Bld: 88 mg/dL (ref 70–99)
Potassium: 3.7 mEq/L (ref 3.5–5.3)
Sodium: 135 mEq/L (ref 135–145)
Total Protein: 6.8 g/dL (ref 6.0–8.3)

## 2010-06-27 ENCOUNTER — Inpatient Hospital Stay (HOSPITAL_COMMUNITY)
Admission: EM | Admit: 2010-06-27 | Discharge: 2010-07-02 | Payer: Self-pay | Source: Home / Self Care | Attending: Internal Medicine | Admitting: Internal Medicine

## 2010-07-01 ENCOUNTER — Other Ambulatory Visit: Payer: Self-pay | Admitting: Internal Medicine

## 2010-07-02 LAB — BASIC METABOLIC PANEL
BUN: 10 mg/dL (ref 6–23)
CO2: 28 mEq/L (ref 19–32)
Calcium: 8.9 mg/dL (ref 8.4–10.5)
Chloride: 103 mEq/L (ref 96–112)
Creatinine, Ser: 0.7 mg/dL (ref 0.4–1.2)
GFR calc Af Amer: >60 mL/min (ref >60)
GFR calc non Af Amer: >60 mL/min (ref >60)
Glucose, Bld: 85 mg/dL (ref 70–99)
Potassium: 3.9 mEq/L (ref 3.5–5.1)
Sodium: 140 mEq/L (ref 135–145)

## 2010-07-02 LAB — GLUCOSE, CAPILLARY: Glucose-Capillary: 94 mg/dL (ref 70–99)

## 2010-07-18 ENCOUNTER — Other Ambulatory Visit: Payer: Self-pay | Admitting: Internal Medicine

## 2010-07-18 DIAGNOSIS — C3490 Malignant neoplasm of unspecified part of unspecified bronchus or lung: Secondary | ICD-10-CM

## 2010-07-29 NOTE — Progress Notes (Signed)
Summary: rx request  Phone Note Call from Patient Call back at Home Phone [phone]   Caller: Patient Call For: Maria Lucero Summary of Call: pt having difficulty sleeping since learning that she has cancer. requests rx for sleep med. call home # but also leave msg on cell [phone]. pt says the home phone often "messes up".  Initial call taken by: Tivis Ringer, CNA,  December 31, 2009 5:30 PM  Follow-up for Phone Call        pt called back.  Has not heard from anyone.  Would like something to help her sleep.  [phone] home or [phone]  dr Brit Wernette pls advise if you would like to give something to help pt sleep  last ov 11/2009 and allergies are morphine,codeine,methocarbmalol and hydroxyzine Follow-up by: Philipp Deputy CMA,  January 01, 2010 10:33 AM  Additional Follow-up for Phone Call Additional follow up Details #1::        Per CDY- give Temazepam 30mg  #30 take 1 by mouth at bedtime as needed sleep no refills.Reynaldo Minium CMA  January 01, 2010 12:42 PM    [facility] [address]Pt aware that rx being sent.Reynaldo Minium CMA  January 01, 2010 12:55 PM  Additional Follow-up by: Waymon Budge MD,  January 01, 2010 4:19 PM    Additional Follow-up for Phone Call Additional follow up Details #2::    Pt states that she is coughing up blood since last night; had radiation tx yesterday.Please advise.Reynaldo Minium CMA  January 01, 2010 1:07 PM   New/Updated Medications: TEMAZEPAM 30 MG CAPS (TEMAZEPAM) take 1 by mouth at bedtime as needed sleep Prescriptions: TEMAZEPAM 30 MG CAPS (TEMAZEPAM) take 1 by mouth at bedtime as needed sleep  #30 x 0   Entered by:   Reynaldo Minium CMA   Authorized by:   Waymon Budge MD   Signed by:   Reynaldo Minium CMA on 01/01/2010   Method used:   Telephoned to ...       [facility]  [address] [phone]* (retail)       [address]       [place], [place]  [id number]       Ph: [phone] or [phone]       Fax: [phone]   RxID:    [id number]

## 2010-07-29 NOTE — Letter (Signed)
Summary: [facility]  [facility]   Imported By: Sherian Rein 03/24/2010 11:23:06  _____________________________________________________________________  External Attachment:    Type:   Image     Comment:   External Document

## 2010-07-29 NOTE — Letter (Signed)
Summary: [facility]  [facility]   Imported By: Sherian Rein 05/09/2010 09:16:54  _____________________________________________________________________  External Attachment:    Type:   Image     Comment:   External Document

## 2010-07-29 NOTE — Progress Notes (Signed)
Summary: results  Phone Note Call from Patient Call back at Home Phone [phone] Call back at [phone]   Caller: Patient Call For: young Summary of Call: pt calling to get biopsy results. call pt at home # or [phone] Initial call taken by: Rickard Patience,  December 25, 2009 10:46 AM  Follow-up for Phone Call        Results of path printed and placed in DrYoung's lookat. Follow-up by: Vernie Murders,  December 25, 2009 10:54 AM  Additional Follow-up for Phone Call Additional follow up Details #1::        I called patient and explained diagnosis from needle bx- adenocarcinoma. She has significant lung disease with PFT pending. Radiologic extent is to pleura LUL, across major fissure to LLL and to hilum. Extremely unlikely to be surgical candidate. she wants chemo referral. I will order PFT, PET and MedOnc referral.  Cough contnues. Codeine causes nausea. She will try hydrocodone. Additional Follow-up by: Waymon Budge MD,  December 25, 2009 4:01 PM    Additional Follow-up for Phone Call Additional follow up Details #2::    PFT scheduled for Friday 12/27/09 at 2:00 at [facility]. PET Scan scheduled for Tues 12/31/09 at 7:45 at [facility]. Still waiting on Oncology Referral. Pt is aware of the two above appt dates times and location and pt is aware that I will call her with the oncology referral once I get the appt. Rhonda Cobb  December 26, 2009 10:16 AM   New/Updated Medications: HYDROMET 5-1.5 MG/5ML SYRP (HYDROCODONE-HOMATROPINE) 1 teaspoon four times a day as needed cough Prescriptions: HYDROMET 5-1.5 MG/5ML SYRP (HYDROCODONE-HOMATROPINE) 1 teaspoon four times a day as needed cough  #200 ml x 0   Entered by:   Waymon Budge MD   Authorized by:   Pulmonary Triage   Signed by:   Waymon Budge MD on 12/25/2009   Method used:   Printed then faxed to ...       [facility]  [address] [phone]* (retail)       [address]       [place], [place]  [id number]       Ph:  [phone] or [phone]       Fax: [phone]   RxID:   [id number]

## 2010-07-29 NOTE — Progress Notes (Signed)
Summary: PRESCRIPT  Phone Note Call from Patient Call back at [phone]   Caller: Patient Call For: YOUNG Summary of Call: NEED PRESCRIPT FOR COUGH SYRUP CALLED [facility] PHARM[facility] [address] Initial call taken by: Rickard Patience,  December 12, 2009 4:01 PM  Follow-up for Phone Call        Spoke with pt.  Last seen on 6/13.  She states that her cough is no better.  She states that she coughed all night last night.  She is requesting that we call in tessalon pearles because these have worked in the past.  Please advise, thanks! Follow-up by: Vernie Murders,  December 12, 2009 4:29 PM  Additional Follow-up for Phone Call Additional follow up Details #1::        1) Script for benzonatate perles  2 samples Tussicaps 10/8 1 two times a day as needed cough  She has hx some narcotic intolerance, Try these for tolerance before larger script. Additional Follow-up by: Waymon Budge MD,  December 12, 2009 4:55 PM    Additional Follow-up for Phone Call Additional follow up Details #2::    Spoke with pt; husband to come pick up samples.Reynaldo Minium CMA  December 12, 2009 4:58 PM   New/Updated Medications: BENZONATATE 200 MG CAPS (BENZONATATE) 1 three times a day as needed cough Prescriptions: BENZONATATE 200 MG CAPS (BENZONATATE) 1 three times a day as needed cough  #30 x 1   Entered by:   Reynaldo Minium CMA   Authorized by:   Waymon Budge MD   Signed by:   Reynaldo Minium CMA on 12/12/2009   Method used:   Electronically to        [facility]  [address] [phone]* (retail)       [address]       [place], [place]  [address]       Ph: [phone] or [phone]       Fax: [phone]   RxID:   [id number] BENZONATATE 200 MG CAPS (BENZONATATE) 1 three times a day as needed cough  #30 x 1   Entered by:   Waymon Budge MD   Authorized by:   Pulmonary Triage   Signed by:   Waymon Budge MD on 12/12/2009   Method used:   Printed then faxed to ...         RxID:    [id number]

## 2010-07-29 NOTE — Letter (Signed)
[facility]   Imported By: Sherian Rein 03/04/2010 14:27:05  _____________________________________________________________________  External Attachment:    Type:   Image     Comment:   External Document

## 2010-07-29 NOTE — Letter (Signed)
[facility]   Imported By: Lennie Odor 01/30/2010 13:28:45  _____________________________________________________________________  External Attachment:    Type:   Image     Comment:   External Document

## 2010-07-29 NOTE — Assessment & Plan Note (Signed)
Summary: LEFT UPPER LOBE MASS/COPD/CB   Primary Provider/Referring Provider:  Holley Bouche  CC:  Pulmonary Consult-Dr. Tiburcio Pea; Left upper Lobe mass and COPD.Marland Kitchen  History of Present Illness: December 09, 2009- 60 yoF seen with her husband on kind referral by Dr Tiburcio Pea because of abnormal CT scan. She is a smoker with 50 pk yr hx, who is aware she has COPD. Increased cough over the past year. For at leaast a month, she has noted streaks of red blood in sputum, which she chose not to report. For the past week cough has increased and sees blood now daily in teaspoon amounts. Tussive  pain and some tenderness to direct pressure over the upper sternum. Tried antibiotic/ amoxacillin withut help.  Denies fever or purulent. Dyspneic with brisk walk. Lost and then regained some of her weight. Denies bruising, nodes,change in bowel or bladder. Unclear about difficulty swallowing. No edema.. CXR- 11/28/09 suggested LUL mass CT chest 12/04/09- Irregular mass LUL into superior segment LLL with prominent nodes. Also COPD. Radiologist recommended bronchoscopy. Other problems incl Bipolar, COPD, HTN.  Preventive Screening-Counseling & Management  Alcohol-Tobacco     Smoking Status: current     Packs/Day: 1.0     Year Started: 1962     Pack years: 39     Tobacco Counseling: to quit use of tobacco products  Current Medications (verified): 1)  Proair Hfa 108 (90 Base) Mcg/act  Aers (Albuterol Sulfate) .Marland Kitchen.. 1-2 Puffs Every 4-6 Hours As Needed 2)  Clonazepam 1 Mg Tabs (Clonazepam) .... Take 1 To 2 Tabs By Mouth 3 To 4 Times Daily As Needed 3)  Lisinopril-Hydrochlorothiazide 10-12.5 Mg Tabs (Lisinopril-Hydrochlorothiazide) .... Take 1 Tablet By Mouth Once A Day 4)  Budeprion Sr 150 Mg Xr12h-Tab (Bupropion Hcl) .... Take 1 Tablet By Mouth Once A Day 5)  Seroquel 25 Mg Tabs (Quetiapine Fumarate) .... Take 1 Tablet By Mouth Once A Day 6)  Oxycodone-Acetaminophen 10-325 Mg Tabs (Oxycodone-Acetaminophen) .... Take 1 Tablet  By Mouth Four Times A Day As Needed 7)  Imitrex 100 Mg Tabs (Sumatriptan Succinate) .... As Needed 8)  Cymbalta 60 Mg Cpep (Duloxetine Hcl) .... Take 1 Tablet By Mouth Three Times A Day 9)  Depakote 500 Mg Tbec (Divalproex Sodium) .... Take 3 By Mouth At Bedtime 10)  Premarin 0.625 Mg Tabs (Estrogens Conjugated) .... Take 1 By Mouth Once Daily  Allergies: 1)  ! Aspirin 2)  ! Biaxin 3)  ! Morphine 4)  ! Codeine 5)  ! * Methocarbmalol 6)  ! * Hydroxyzine  Past History:  Family History: Last updated: 12/09/2009 Allergies: sister Heart Disease: father side RA: mother Cancer: mother(lung), father(colon), 3 aunts(2 with lung and 1 with brain), Uncle(brain), Grandmother(maternal-kidney).  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 Smoker:51years at 1ppd No ETOH  Risk Factors: Smoking Status: current (12/09/2009) Packs/Day: 1.0 (12/09/2009)  Past Medical History: UNSPECIFIED MYALGIA AND MYOSITIS (ICD-729.1) MIGRAINE HEADACHE (ICD-346.90) ANXIETY (ICD-300.00) URTICARIA (ICD-708.9) LOW BACK PAIN SYNDROME (ICD-724.2) HYPERLIPIDEMIA (ICD-272.4) HYPERCHOLESTEROLEMIA (ICD-272.0) COPD (ICD-496) DEPRESSION, MAJOR, RECURRENT (ICD-296.30) Lung Mass Tobacco    Past Surgical History: back surgery x 4 with fusion L3-S1 1996, 2000, 2002 hysterectomy Appendectomy  Family History: Allergies: sister Heart Disease: father side RA: mother Cancer: mother(lung), father(colon), 3 aunts(2 with lung and 1 with brain), Uncle(brain), Grandmother(maternal-kidney).  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         Indicates pain along dorsum left upper arm, seeming related to sternal area pain.  Vital Signs:  Patient profile:   60 year old female Height:      64 inches Weight:      128 pounds BMI:     22.05 O2 Sat:      93 % on Room air Pulse rate:   89 / minute BP sitting:   110 / 80  Vitals Entered By: Reynaldo Minium CMA (December 09, 2009 2:45 PM)  O2 Flow:  Room air  Physical Exam  Additional Exam:  General: A/Ox3; pleasant and cooperative, NAD, slender SKIN: no rash, lesions NODES: no lymphadenopathy HEENT: [place]/AT, EOM- WNL, Conjuctivae- clear, PERRLA, TM-WNL, Nose- clear, Throat- clear and wnl, Mallampati  II NECK: Supple w/ fair ROM, JVD- none, normal carotid impulses w/o bruits Thyroid- normal to palpation CHEST: bilateral crackles, unlabored, odor tobacco, Indicates sternal pain with forced deep breath, no cough HEART: RRR, no m/g/r heard ABDOMEN: Soft and nl; nml bowel sounds; no organomegaly or masses noted KGM:WNUU, nl pulses, no edema , walks with cane right hand NEURO: Grossly intact to observation      Impression & Recommendations:  Problem # 1:  MASS, LUNG (ICD-786.6)  Left upper lobe mass with sternal and left upper arm pains, hemoptysis. With husband here, we discussed this as a probable lung cancer. We discussed bronchoscopy including technique, common risks, compications and goals as well as possiblity of nondiagnostic study.  With her agreement we will go forward with scheduling for biopsy.  Orders: TLB-CBC Platelet - w/Differential (85025-CBCD) TLB-BMP (Basic Metabolic Panel-BMET) (80048-METABOL) TLB-PTT (85730-PTTL) TLB-PT (Protime) (85610-PTP) Pulmonary Referral (Pulmonary)  Problem # 2:  COPD (ICD-496) She will need at least spirometry. We put priority on getting a dx, because the mass looks inoperable on first assessment. I doubt she can blow hard  due to chest wall pain. Bone scan/ PET anticipated to assess for assessment of mets including into chest wall. brachial plexus.  Problem # 3:  DEPRESSION, MAJOR, RECURRENT (ICD-296.30) Bipolar disorder by her report. This is likely to make it harder for her to cope. Husband seems supportive.  Medications Added to Medication List This Visit: 1)  Cymbalta 60 Mg Cpep (Duloxetine hcl) .... Take 1 tablet by mouth three times a day 2)  Depakote 500 Mg Tbec (Divalproex sodium) .... Take 3 by mouth at bedtime 3)  Premarin 0.625 Mg Tabs (Estrogens conjugated) .... Take 1 by mouth once daily  Patient Instructions: 1)  Please schedule a follow-up appointment in 2 weeks. 2)  See [facility] to schedule bronchoscopy: No aspirin products. Nothing to eat or drink  after a light early breakfast on the day of the study. Anticipate staying 3 hours, with someone to drive you home.

## 2010-07-29 NOTE — Miscellaneous (Signed)
Summary: Orders Update pft charges  Clinical Lists Changes  Orders: Added new Service order of Carbon Monoxide diffusing w/capacity (94720) - Signed Added new Service order of Lung Volumes (94240) - Signed Added new Service order of Spirometry (Pre & Post) (94060) - Signed 

## 2010-07-29 NOTE — Letter (Signed)
Summary: [facility]  [facility]   Imported By: Lester McQueeney 04/29/2010 08:34:02  _____________________________________________________________________  External Attachment:    Type:   Image     Comment:   External Document

## 2010-07-29 NOTE — Letter (Signed)
Summary: [facility]  [facility]   Imported By: Sherian Rein 01/22/2010 13:26:59  _____________________________________________________________________  External Attachment:    Type:   Image     Comment:   External Document

## 2010-07-29 NOTE — Progress Notes (Signed)
Summary: IR to consider for needle lung bx  Phone Note Outgoing Call   Summary of Call: I called Interventional Radiology. Chris/ tech, will have MD review images and consider appropriateness for needle bx. Initial call taken by: Waymon Budge MD,  December 12, 2009 4:59 PM

## 2010-07-29 NOTE — Consult Note (Signed)
Summary: [facility]  [facility]   Imported By: Sherian Rein 02/21/2010 10:46:50  _____________________________________________________________________  External Attachment:    Type:   Image     Comment:   External Document

## 2010-07-29 NOTE — Letter (Signed)
Summary: [facility]  [facility]   Imported By: Lennie Odor 05/23/2010 15:04:46  _____________________________________________________________________  External Attachment:    Type:   Image     Comment:   External Document

## 2010-07-29 NOTE — Progress Notes (Signed)
Summary: medication  Phone Note Call from Patient   Caller: Patient Call For: young Summary of Call: pt unable to take percocet need alternative [facility] [address] Initial call taken by: Rickard Patience,  December 26, 2009 9:51 AM  Follow-up for Phone Call        The patient says the pharmacy gave her the wrong medication name. She cannot take Percocet but was needing the cough medication Hydromet. She will call her pharmacy to let them know this is what she is requesting and that Dr. Charm Rings already called in the RX. Follow-up by: Michel Bickers CMA,  December 26, 2009 9:59 AM

## 2010-07-29 NOTE — Letter (Signed)
[facility]   Imported By: Sherian Rein 02/06/2010 14:00:08  _____________________________________________________________________  External Attachment:    Type:   Image     Comment:   External Document

## 2010-07-29 NOTE — Letter (Signed)
[facility]   Imported By: Lester [place] 02/19/2010 10:45:19  _____________________________________________________________________  External Attachment:    Type:   Image     Comment:   External Document

## 2010-07-29 NOTE — Progress Notes (Signed)
Summary: fax request  Phone Note From Other Clinic   Caller: jennifer w/ [facility] Call For: young Summary of Call: needs copy of pft results faxed asap this morning to [phone] attn: jennifer. contact # is [phone] Initial call taken by: Tivis Ringer, CNA,  January 02, 2010 9:46 AM  Follow-up for Phone Call        PFT faxed to [facility]. Alfonso Ramus  January 02, 2010 12:24 PM  Faxed pft.//Juanita  Follow-up by: Darletta Moll,  January 03, 2010 3:00 PM

## 2010-07-29 NOTE — Consult Note (Signed)
[facility]   Imported By: Lester [place] 01/22/2010 10:52:31  _____________________________________________________________________  External Attachment:    Type:   Image     Comment:   External Document

## 2010-07-29 NOTE — Letter (Signed)
Summary: [facility]  [facility]   Imported By: Lennie Odor 04/21/2010 15:38:14  _____________________________________________________________________  External Attachment:    Type:   Image     Comment:   External Document

## 2010-07-29 NOTE — Progress Notes (Signed)
Summary: For needle bx lung mass  Phone Note Outgoing Call   Summary of Call:  I got a call today from Dr Henn/ IR. They feel they can do needle lung bx of the mass. i called her and she agrees to proceed. Initial call taken by: Waymon Budge MD,  December 13, 2009 8:59 AM  Follow-up for Phone Call        Tobi Bastos spoke with pt and scheduled biopsy on Monday 12/23/09 at 1:00 at [facility]. Pt to arrive at 11:30. Contacted pt and she is aware of appt. Rhonda Cobb  December 16, 2009 10:52 AM

## 2010-07-31 NOTE — Letter (Signed)
Summary: [facility]  [facility]   Imported By: Sherian Rein 06/25/2010 07:20:40  _____________________________________________________________________  External Attachment:    Type:   Image     Comment:   External Document

## 2010-08-01 ENCOUNTER — Telehealth: Payer: Self-pay | Admitting: Internal Medicine

## 2010-08-04 ENCOUNTER — Other Ambulatory Visit: Payer: Self-pay | Admitting: Internal Medicine

## 2010-08-04 ENCOUNTER — Encounter: Payer: Self-pay | Admitting: Internal Medicine

## 2010-08-04 ENCOUNTER — Ambulatory Visit (INDEPENDENT_AMBULATORY_CARE_PROVIDER_SITE_OTHER): Payer: Medicare Other | Admitting: Internal Medicine

## 2010-08-04 ENCOUNTER — Ambulatory Visit (INDEPENDENT_AMBULATORY_CARE_PROVIDER_SITE_OTHER)
Admission: RE | Admit: 2010-08-04 | Discharge: 2010-08-04 | Disposition: A | Discharge status: Home / Self Care | Payer: Medicare Other | Source: Ambulatory Visit | Attending: Internal Medicine | Admitting: Internal Medicine

## 2010-08-04 DIAGNOSIS — J449 Chronic obstructive pulmonary disease, unspecified: Secondary | ICD-10-CM

## 2010-08-04 DIAGNOSIS — R042 Hemoptysis: Secondary | ICD-10-CM

## 2010-08-04 DIAGNOSIS — J4489 Other specified chronic obstructive pulmonary disease: Secondary | ICD-10-CM

## 2010-08-04 DIAGNOSIS — C3490 Malignant neoplasm of unspecified part of unspecified bronchus or lung: Secondary | ICD-10-CM

## 2010-08-04 DIAGNOSIS — C3430 Malignant neoplasm of lower lobe, unspecified bronchus or lung: Secondary | ICD-10-CM | POA: Insufficient documentation

## 2010-08-06 NOTE — Progress Notes (Signed)
Summary: hemoptysis yesterday  Phone Note Outgoing Call   Call placed by: Gweneth Dimitri RN,  August 01, 2010 11:48 AM Call placed to: Patient Summary of Call: When checking CY's charts for Monday, noticed pt was placed on his schedule for hemoptysis.  Called, spoke with pt. States she had an episode of hemoptysis yesterday - small amount of "pinkish - red" mixed with mucus.  Also having wheezing, deep cough, chest congestion, chills, sweats, chest hurts especially when breathing deep.  SOB but states this is no worse.    Allergies: ! ASPIRIN ! BIAXIN ! MORPHINE ! CODEINE ! * METHOCARBMALOL ! * HYDROXYZINE  [facility] [address]  Dr. Maple Hudson, pls advise.  Thanks! Initial call taken by: Gweneth Dimitri RN,  August 01, 2010 11:53 AM  Follow-up for Phone Call        If no worse, then will assess at scheduled OV in 3 days. Thanks.  Follow-up by: Waymon Budge MD,  August 01, 2010 11:56 AM  Additional Follow-up for Phone Call Additional follow up Details #1::        Pt stated is no worse and will go to ER if status changes during the weekend. Zackery Barefoot CMA  August 01, 2010 4:15 PM

## 2010-08-14 NOTE — Assessment & Plan Note (Signed)
Summary: coughing up blood/jd   Primary Provider/Referring Provider:  Holley Bouche  CC:  Hemoptysis since last week(pink to red in color)..  History of Present Illness: History of Present Illness: December 09, 2009- 60 yoF seen with her husband on kind referral by Dr Tiburcio Pea because of abnormal CT scan. She is a smoker with 50 pk yr hx, who is aware she has COPD. Increased cough over the past year. For at leaast a month, she has noted streaks of red blood in sputum, which she chose not to report. For the past week cough has increased and sees blood now daily in teaspoon amounts. Tussive  pain and some tenderness to direct pressure over the upper sternum. Tried antibiotic/ amoxacillin withut help.  Denies fever or purulent. Dyspneic with brisk walk. Lost and then regained some of her weight. Denies bruising, nodes,change in bowel or bladder. Unclear about difficulty swallowing. No edema.. CXR- 11/28/09 suggested LUL mass CT chest 12/04/09- Irregular mass LUL into superior segment LLL with prominent nodes. Also COPD. Radiologist recommended bronchoscopy. Other problems incl Bipolar, COPD, HTN  August 04, 2010- Hemoptysis, Adenoca-Chemo/XRT..............Marland Kitchenhusband here Nurse-CC: Hemoptysis since last week(pink to red in color). 60 yo smoker w/ unresectable Stage II(T3N0M0) adenoca LUL dxd June, 2011by needle bx. Chemo and XRT- last cycle in December, 2011. She states she is in remission. While in hosp early December she had CT and Brain MRI indicated without progression. She now reports increased painful breathing for months- midanteriior chest. In last week deeper cough- new heme in clear mucus began without sense of infection. Tablespoon amounts of blood at first, now slowed with only a little pink in last few days. Taking oxycodone for chest pain but no ASA. More dyspneic now walking down hall. Has had O2 since hosp at Thanksgiving, 2L. Denies other bleed or bruise, nodes, fever or obvious infection.     Preventive Screening-Counseling & Management  Alcohol-Tobacco     Smoking Status: quit     Packs/Day: 1.0     Year Started: 1960     Year Quit: 2011     Pack years: 53     Tobacco Counseling: not to resume use of tobacco products  Current Medications (verified): 1)  Ventolin Hfa 108 (90 Base) Mcg/act Aers (Albuterol Sulfate) .... 2 Puffs Four Times A Day As Needed 2)  Clonazepam 1 Mg Tabs (Clonazepam) .... Take 1 To 2 Tabs By Mouth 3 To 4 Times Daily As Needed 3)  Lisinopril-Hydrochlorothiazide 10-12.5 Mg Tabs (Lisinopril-Hydrochlorothiazide) .... Take 1 Tablet By Mouth Once A Day 4)  Budeprion Sr 150 Mg Xr12h-Tab (Bupropion Hcl) .... Take 1 By Mouth Two Times A Day 5)  Seroquel 25 Mg Tabs (Quetiapine Fumarate) .... Take 1 Tablet By Mouth Once A Day 6)  Oxycodone-Acetaminophen 10-325 Mg Tabs (Oxycodone-Acetaminophen) .... Take 1 Tablet By Mouth Four Times A Day As Needed 7)  Imitrex 100 Mg Tabs (Sumatriptan Succinate) .... As Needed 8)  Cymbalta 60 Mg Cpep (Duloxetine Hcl) .... Take 3 By Mouth Once Daily 9)  Depakote 500 Mg Tbec (Divalproex Sodium) .... Take 3 By Mouth At Bedtime 10)  Premarin 0.625 Mg Tabs (Estrogens Conjugated) .... Take 1 By Mouth Once Daily 11)  Benzonatate 200 Mg Caps (Benzonatate) .Marland Kitchen.. 1 Three Times A Day As Needed Cough 12)  Hydromet 5-1.5 Mg/51ml Syrp (Hydrocodone-Homatropine) .Marland Kitchen.. 1 Teaspoon Four Times A Day As Needed Cough 13)  Temazepam 30 Mg Caps (Temazepam) .... Take 1 By Mouth At Bedtime As Needed Sleep 14)  Folic  Acid 400 Mcg Tabs (Folic Acid) .... Take 1 By Mouth Once Daily 15)  Prilosec 20 Mg Cpdr (Omeprazole) .... Take 1 By Mouth Once Daily  Allergies (verified): 1)  ! Aspirin 2)  ! Biaxin 3)  ! Morphine 4)  ! Codeine 5)  ! * Methocarbmalol 6)  ! * Hydroxyzine  Past History:  Past Medical History: UNSPECIFIED MtYALGIA AND MYOSITIS (ICD-729.1) MIGRAINE HEADACHE (ICD-346.90) ANXIETY (ICD-300.00) URTICARIA (ICD-708.9) LOW BACK PAIN  SYNDROME (ICD-724.2) HYPERLIPIDEMIA (ICD-272.4) HYPERCHOLESTEROLEMIA (ICD-272.0) COPD (ICD-496) DEPRESSION, MAJOR, RECURRENT (ICD-296.30) Adenocarcinoma lung Stage II, T3NoMo chemo and XRT- Dr Shirline Frees Tobacco    Past Surgical History: back surgery x 4 with fusion L3-S1 1996, 2000, 2002 hysterectomy Appendectomy Bronchoscopy 2011 -Needle bx lung- 2011- adenoca lung  Social History: Smoking Status:  quit  Review of Systems       The patient complains of shortness of breath with activity, productive cough, coughing up blood, and chest pain.  The patient denies irregular heartbeats, acid heartburn, indigestion, loss of appetite, weight change, abdominal pain, difficulty swallowing, sore throat, tooth/dental problems, headaches, nasal congestion/difficulty breathing through nose, sneezing, itching, ear ache, anxiety, hand/feet swelling, joint stiffness or pain, rash, change in color of mucus, and fever.         Hosp for syncope with confusion Numbness from mid calves down related to old back surgeries.   Vital Signs:  Patient profile:   60 year old female Height:      64 inches Weight:      123.13 pounds BMI:     21.21 O2 Sat:      97 % on Room air Pulse rate:   95 / minute BP sitting:   104 / 60  (left arm) Cuff size:   regular  Vitals Entered By: Reynaldo Minium CMA (August 04, 2010 1:47 PM)  O2 Flow:  Room air CC: Hemoptysis since last week(pink to red in color).   Physical Exam  Additional Exam:  General: A/Ox3; pleasant and cooperative, NAD, slender,  SKIN: no rash, lesions NODES: no lymphadenopathy HEENT: [place]/AT, EOM- WNL, Conjuctivae- clear, PERRLA, TM-WNL, Nose- clear, Throat- clear and wnl, Mallampati  II NECK: Supple w/ fair ROM, JVD- none, normal carotid impulses w/o bruits Thyroid- normal to palpation CHEST: bilateral crackles, unlabored, odor tobacco, Indicates sternal pain with forced deep breath, no cough HEART: RRR, no m/g/r heard ABDOMEN: Soft and nl;  nml bowel sounds; no organomegaly or masses noted JXB:JYNW, nl pulses, no edema, nail bed clubbing   NEURO: Grossly intact to observation, alert, communicative      Impression & Recommendations:  Problem # 1:  HEMOPTYSIS UNSPECIFIED (ICD-786.30)  We will get CXR, give empiric antibiotic for  the small possibility there is a bacterial bronchitis component, let her use her cough syrup as needed and avoid unneccesary physical activity.  Her updated medication list for this problem includes:    Ventolin Hfa 108 (90 Base) Mcg/act Aers (Albuterol sulfate) .Marland Kitchen... 2 puffs four times a day as needed    Zithromax Z-pak 250 Mg Tabs (Azithromycin) .Marland Kitchen... 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   Problem # 3:  COPD (ICD-496)  Medications Added to Medication List This Visit: 1)  Ventolin Hfa 108 (90 Base) Mcg/act Aers (Albuterol sulfate) .... 2 puffs four times a day as needed 2)  Budeprion Sr 150 Mg Xr12h-tab (Bupropion hcl) .... Take  1 by mouth two times a day 3)  Cymbalta 60 Mg Cpep (Duloxetine hcl) .... Take 3 by mouth once daily 4)  Folic Acid 400 Mcg Tabs (Folic acid) .... Take 1 by mouth once daily 5)  Prilosec 20 Mg Cpdr (Omeprazole) .... Take 1 by mouth once daily 6)  Oxygen 2 L/m Sleep and Portable  .... Advanced 7)  Zithromax Z-pak 250 Mg Tabs (Azithromycin) .... 2 today then one daily  Other Orders: Est. Patient Level IV (99214) T-2 View CXR (71020TC)  Patient Instructions: 1)  Please schedule a follow-up appointment in 3 weeks- ok to cancel or change this as needed 2)  A chest x-ray has been recommended.  Your imaging study may require preauthorization.  3)  Script for Z pak sent to your drug store 4)  Avoid aspirin and NSAIDs like ibuprofen and naprosyn until bleeding gone. Ok to use tylenol 5)  Try not to be very active, so the bleeding will have a chance to seal  and stop.  6)  ................................................................................. 7)  Clinical Data: Hemoptysis.  Shortness of breath.  Chest pain. 8)  Hypertension.  History of smoking. 9)    10)  CHEST - 2 VIEW 11)    12)  Comparison: 07/01/2010 13)    14)  Findings: The patient has a right-sided Port-A-Cath with tip to the 15)  level of the superior vena cava. 16)    17)  Lungs are hyperinflated.  Heart is normal in size.  Again noted  is 18)  left upper lobe pulmonary density, stable in appearance compared to 19)  prior studies.  There is volume loss on the left.  Emphysematous 20)  changes are identified, with accentuated lung markings at the right 21)  lung base.  There is left apical pleural thickening, stable 22)  appearance.  The patient has had prior thoracolumbar fusion. 23)    24)  IMPRESSION: 25)    26)  1.  Emphysema. 27)  2.  Left pulmonary opacity appears stable. 28)  3.  Left lung volume loss. 29)    30)  Original Report Authenticated By: Patterson Hammersmith, M.D. 31)  _______________ 32)  cc Dr Shirline Frees Prescriptions: ZITHROMAX Z-PAK 250 MG TABS (AZITHROMYCIN) 2 today then one daily  #1 pak x 0   Entered and Authorized by:   Waymon Budge MD   Signed by:   Waymon Budge MD on 08/04/2010   Method used:   Electronically to        [facility]  [address] [phone]* (retail)       [address]       [place], [place]  [id number]       Ph: [phone] or [phone]       Fax: [phone]   RxID:   [id number]

## 2010-08-29 ENCOUNTER — Ambulatory Visit (INDEPENDENT_AMBULATORY_CARE_PROVIDER_SITE_OTHER): Payer: Medicare Other | Admitting: Internal Medicine

## 2010-08-29 ENCOUNTER — Encounter: Payer: Self-pay | Admitting: Internal Medicine

## 2010-08-29 DIAGNOSIS — R042 Hemoptysis: Secondary | ICD-10-CM

## 2010-08-29 DIAGNOSIS — G4700 Insomnia, unspecified: Secondary | ICD-10-CM | POA: Insufficient documentation

## 2010-08-29 DIAGNOSIS — J449 Chronic obstructive pulmonary disease, unspecified: Secondary | ICD-10-CM

## 2010-08-29 DIAGNOSIS — C3490 Malignant neoplasm of unspecified part of unspecified bronchus or lung: Secondary | ICD-10-CM

## 2010-09-04 NOTE — Assessment & Plan Note (Signed)
Summary: rov 3 wks ///kp   Primary Provider/Referring Provider:  Holley Bouche  CC:  3 week follow up visit-COPD; wheezing, SOB, cough-clear, and unable to get deep breath(painful in chest)..  History of Present Illness: August 04, 2010- Hemoptysis, Adenoca-Chemo/XRT..............Marland Kitchenhusband here Nurse-CC: Hemoptysis since last week(pink to red in color). 60 yo smoker w/ unresectable Stage II(T3N0M0) adenoca LUL dxd June, 2011by needle bx. Chemo and XRT- last cycle in December, 2011. She states she is in remission. While in hosp early December she had CT and Brain MRI indicated without progression. She now reports increased painful breathing for months- midanteriior chest. In last week deeper cough- new heme in clear mucus began without sense of infection. Tablespoon amounts of blood at first, now slowed with only a little pink in last few days. Taking oxycodone for chest pain but no ASA. More dyspneic now walking down hall. Has had O2 since hosp at Thanksgiving, 2L. Denies other bleed or bruise, nodes, fever or obvious infection.   August 29, 2010  Hemoptysis, Adenoca-Chemo/XRT, Insomnia..............Marland Kitchenhusband here Nurse-CC: 3 week follow up visit-COPD; wheezing, SOB,cough-clear,unable to get deep breath(painful in chest). Pain in anterior chest may be some worse. Clear sputum, no more blood. She has f/u CT pending w/ Dr Shirline Frees to see her in about 2 weeks. She uses oxygen most of the time, especially when wheezing, but not usually sleeping with it. Ventolin doesn't help wheeze much. Needs refill- temazepam does help.     Preventive Screening-Counseling & Management  Alcohol-Tobacco     Smoking Status: quit     Packs/Day: 1.0     Year Started: 1960     Year Quit: 2011     Pack years: 88     Tobacco Counseling: not to resume use of tobacco products  Current Medications (verified): 1)  Ventolin Hfa 108 (90 Base) Mcg/act Aers (Albuterol Sulfate) .... 2 Puffs Four Times A Day As Needed 2)   Clonazepam 1 Mg Tabs (Clonazepam) .... Take 1 To 2 Tabs By Mouth 3 To 4 Times Daily As Needed 3)  Lisinopril-Hydrochlorothiazide 10-12.5 Mg Tabs (Lisinopril-Hydrochlorothiazide) .... Take 1 Tablet By Mouth Once A Day 4)  Budeprion Sr 150 Mg Xr12h-Tab (Bupropion Hcl) .... Take 1 By Mouth Two Times A Day 5)  Seroquel 25 Mg Tabs (Quetiapine Fumarate) .... Take 1 Tablet By Mouth Once A Day 6)  Oxycodone-Acetaminophen 10-325 Mg Tabs (Oxycodone-Acetaminophen) .... Take 1 Tablet By Mouth Four Times A Day As Needed 7)  Imitrex 100 Mg Tabs (Sumatriptan Succinate) .... As Needed 8)  Cymbalta 60 Mg Cpep (Duloxetine Hcl) .... Take 3 By Mouth Once Daily 9)  Depakote 500 Mg Tbec (Divalproex Sodium) .... Take 3 By Mouth At Bedtime 10)  Premarin 0.625 Mg Tabs (Estrogens Conjugated) .... Take 1 By Mouth Once Daily 11)  Benzonatate 200 Mg Caps (Benzonatate) .Marland Kitchen.. 1 Three Times A Day As Needed Cough 12)  Hydromet 5-1.5 Mg/81ml Syrp (Hydrocodone-Homatropine) .Marland Kitchen.. 1 Teaspoon Four Times A Day As Needed Cough 13)  Temazepam 30 Mg Caps (Temazepam) .... Take 1 By Mouth At Bedtime As Needed Sleep 14)  Folic Acid 400 Mcg Tabs (Folic Acid) .... Take 1 By Mouth Once Daily 15)  Prilosec 20 Mg Cpdr (Omeprazole) .... Take 1 By Mouth Once Daily 16)  Oxygen 2 L/m Sleep and Portable .... Advanced  Allergies (verified): 1)  ! Aspirin 2)  ! Biaxin 3)  ! Morphine 4)  ! Codeine 5)  ! * Methocarbmalol 6)  ! * Hydroxyzine  Past History:  Past Medical History: Last updated: 08/04/2010 UNSPECIFIED MtYALGIA AND MYOSITIS (ICD-729.1) MIGRAINE HEADACHE (ICD-346.90) ANXIETY (ICD-300.00) URTICARIA (ICD-708.9) LOW BACK PAIN SYNDROME (ICD-724.2) HYPERLIPIDEMIA (ICD-272.4) HYPERCHOLESTEROLEMIA (ICD-272.0) COPD (ICD-496) DEPRESSION, MAJOR, RECURRENT (ICD-296.30) Adenocarcinoma lung Stage II, T3NoMo chemo and XRT- Dr Shirline Frees Tobacco    Past Surgical History: Last updated: 08/04/2010 back surgery x 4 with fusion L3-S1 1996,  2000, 2002 hysterectomy Appendectomy Bronchoscopy 2011 -Needle bx lung- 2011- adenoca lung  Family History: Last updated: 12/09/2009 Allergies: sister Heart Disease: father side RA: mother Cancer: mother(lung), father(colon), 3 aunts(2 with lung and 1 with brain), Uncle(brain), Grandmother(maternal-kidney).  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  The patient denies shortness of breath at rest, productive cough, non-productive cough, coughing up blood, irregular heartbeats, acid heartburn, indigestion, loss of appetite, weight change, abdominal pain, difficulty swallowing, sore throat, tooth/dental problems, headaches, nasal congestion/difficulty breathing through nose, and sneezing.    Vital Signs:  Patient profile:   60 year old female Height:      64 inches Weight:      127.25 pounds BMI:     21.92 O2 Sat:      100 % on 2 L/min Pulse rate:   93 / minute BP sitting:   124 / 72  (left arm) Cuff size:   regular  Vitals Entered By: Reynaldo Minium CMA (August 29, 2010 2:51 PM)  O2 Flow:  2 L/min CC: 3 week follow up visit-COPD; wheezing, SOB,cough-clear,unable to get deep breath(painful in chest).   Physical Exam  Additional Exam:  General: A/Ox3; pleasant and cooperative, NAD, slender, on 2 L O2, Sat 100% SKIN: no rash, lesions NODES: no lymphadenopathy HEENT: [place]/AT, EOM- WNL, Conjuctivae- clear, PERRLA, TM-WNL, Nose- clear, Throat- clear and wnl, Mallampati  II NECK: Supple w/ fair ROM, JVD- none, normal carotid impulses w/o bruits Thyroid- normal to palpation CHEST: bilateral crackles, unlabored, Indicates sternal pain with forced deep breath, no cough. Persistent wheeze left mid lung no  rub. HEART: RRR, no m/g/r heard ABDOMEN: Soft and nl; nml bowel sounds; no organomegaly or masses noted ZOX:WRUE, nl pulses, no edema, nail bed clubbing   NEURO: Grossly intact to observation, alert, communicative      Impression & Recommendations:  Problem # 1:  HEMOPTYSIS UNSPECIFIED (ICD-786.30) Bleeding from hemorrhagic bronchitis related to her cancer is resolved at least for now.  The following medications were removed from the medication list:    Zithromax Z-pak 250 Mg Tabs (Azithromycin) .Marland Kitchen... 2 today then one daily Her updated medication list for this problem includes:    Ventolin Hfa 108 (90 Base) Mcg/act Aers (Albuterol sulfate) .Marland Kitchen... 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   Problem # 4:  INSOMNIA (ICD-780.52) We can refill temazepam Her updated medication list for this problem includes:    Temazepam 30 Mg Caps (Temazepam) .Marland Kitchen... Take 1 by mouth at bedtime as needed sleep  Patient Instructions: 1)  Please schedule a follow-up appointment in 6 months. 2)  Refill script  for temazepam. Prescriptions: TEMAZEPAM 30 MG CAPS (TEMAZEPAM) take 1 by mouth at bedtime as needed sleep  #30 x 5   Entered and Authorized by:   Waymon Budge MD   Signed by:   Waymon Budge MD on 08/29/2010   Method used:   Print then Give to Patient   RxID:   [id number]

## 2010-09-05 ENCOUNTER — Ambulatory Visit (HOSPITAL_COMMUNITY)
Admission: RE | Admit: 2010-09-05 | Discharge: 2010-09-05 | Disposition: A | Discharge status: Home / Self Care | Payer: 59 | Source: Ambulatory Visit | Attending: Internal Medicine | Admitting: Internal Medicine

## 2010-09-05 ENCOUNTER — Other Ambulatory Visit: Payer: Self-pay | Admitting: Internal Medicine

## 2010-09-05 ENCOUNTER — Encounter (HOSPITAL_COMMUNITY): Payer: Self-pay

## 2010-09-05 ENCOUNTER — Encounter (HOSPITAL_BASED_OUTPATIENT_CLINIC_OR_DEPARTMENT_OTHER): Payer: Medicare Other | Admitting: Internal Medicine

## 2010-09-05 DIAGNOSIS — C3410 Malignant neoplasm of upper lobe, unspecified bronchus or lung: Secondary | ICD-10-CM

## 2010-09-05 DIAGNOSIS — Z9221 Personal history of antineoplastic chemotherapy: Secondary | ICD-10-CM | POA: Insufficient documentation

## 2010-09-05 DIAGNOSIS — R05 Cough: Secondary | ICD-10-CM | POA: Insufficient documentation

## 2010-09-05 DIAGNOSIS — C3490 Malignant neoplasm of unspecified part of unspecified bronchus or lung: Secondary | ICD-10-CM | POA: Insufficient documentation

## 2010-09-05 DIAGNOSIS — R059 Cough, unspecified: Secondary | ICD-10-CM | POA: Insufficient documentation

## 2010-09-05 DIAGNOSIS — R0602 Shortness of breath: Secondary | ICD-10-CM | POA: Insufficient documentation

## 2010-09-05 HISTORY — DX: Malignant (primary) neoplasm, unspecified: C80.1

## 2010-09-05 LAB — CMP (CANCER CENTER ONLY)
ALT(SGPT): 15 U/L (ref 10–47)
AST: 18 U/L (ref 11–38)
Albumin: 3.3 g/dL (ref 3.3–5.5)
Alkaline Phosphatase: 107 U/L — ABNORMAL HIGH (ref 26–84)
Calcium: 9.1 mg/dL (ref 8.0–10.3)
Chloride: 96 mEq/L — ABNORMAL LOW (ref 98–108)
Potassium: 4.5 mEq/L (ref 3.3–4.7)
Sodium: 142 mEq/L (ref 128–145)
Total Protein: 7.2 g/dL (ref 6.4–8.1)

## 2010-09-05 LAB — CBC WITH DIFFERENTIAL/PLATELET
EOS%: 4.6 % (ref 0.0–7.0)
HGB: 11.8 g/dL (ref 11.6–15.9)
MCH: 33.5 pg (ref 25.1–34.0)
MCV: 97.9 fL (ref 79.5–101.0)
MONO%: 8.5 % (ref 0.0–14.0)
NEUT#: 2.6 10*3/uL (ref 1.5–6.5)
RBC: 3.52 10*6/uL — ABNORMAL LOW (ref 3.70–5.45)
RDW: 14 % (ref 11.2–14.5)
lymph#: 1.1 10*3/uL (ref 0.9–3.3)

## 2010-09-05 MED ORDER — IOHEXOL 300 MG/ML  SOLN
80.0000 mL | Freq: Once | INTRAMUSCULAR | Status: AC | PRN
  Administered 2010-09-05: 80 mL via INTRAVENOUS

## 2010-09-08 LAB — GLUCOSE, CAPILLARY
Glucose-Capillary: 101 mg/dL — ABNORMAL HIGH (ref 70–99)
Glucose-Capillary: 104 mg/dL — ABNORMAL HIGH (ref 70–99)
Glucose-Capillary: 108 mg/dL — ABNORMAL HIGH (ref 70–99)
Glucose-Capillary: 109 mg/dL — ABNORMAL HIGH (ref 70–99)
Glucose-Capillary: 113 mg/dL — ABNORMAL HIGH (ref 70–99)
Glucose-Capillary: 119 mg/dL — ABNORMAL HIGH (ref 70–99)
Glucose-Capillary: 123 mg/dL — ABNORMAL HIGH (ref 70–99)
Glucose-Capillary: 128 mg/dL — ABNORMAL HIGH (ref 70–99)
Glucose-Capillary: 143 mg/dL — ABNORMAL HIGH (ref 70–99)
Glucose-Capillary: 73 mg/dL (ref 70–99)
Glucose-Capillary: 81 mg/dL (ref 70–99)
Glucose-Capillary: 89 mg/dL (ref 70–99)
Glucose-Capillary: 90 mg/dL (ref 70–99)
Glucose-Capillary: 91 mg/dL (ref 70–99)

## 2010-09-08 LAB — RAPID URINE DRUG SCREEN, HOSP PERFORMED
Amphetamines: NOT DETECTED
Opiates: NOT DETECTED
Tetrahydrocannabinol: NOT DETECTED

## 2010-09-08 LAB — CBC
HCT: 33.8 % — ABNORMAL LOW (ref 36.0–46.0)
MCV: 94.4 fL (ref 78.0–100.0)
Platelets: 303 10*3/uL (ref 150–400)
RBC: 3 MIL/uL — ABNORMAL LOW (ref 3.87–5.11)
RBC: 3.58 MIL/uL — ABNORMAL LOW (ref 3.87–5.11)
RDW: 16.1 % — ABNORMAL HIGH (ref 11.5–15.5)
WBC: 3 10*3/uL — ABNORMAL LOW (ref 4.0–10.5)
WBC: 3.2 10*3/uL — ABNORMAL LOW (ref 4.0–10.5)

## 2010-09-08 LAB — CARDIAC PANEL(CRET KIN+CKTOT+MB+TROPI)
CK, MB: 0.5 ng/mL (ref 0.3–4.0)
CK, MB: 0.5 ng/mL (ref 0.3–4.0)
Relative Index: INVALID (ref 0.0–2.5)
Relative Index: INVALID (ref 0.0–2.5)
Total CK: 17 U/L (ref 7–177)
Total CK: 19 U/L (ref 7–177)
Troponin I: 0.01 ng/mL (ref 0.00–0.06)

## 2010-09-08 LAB — DIFFERENTIAL
Basophils Absolute: 0 10*3/uL (ref 0.0–0.1)
Basophils Relative: 0 % (ref 0–1)
Eosinophils Relative: 6 % — ABNORMAL HIGH (ref 0–5)
Lymphocytes Relative: 26 % (ref 12–46)
Lymphs Abs: 0.8 10*3/uL (ref 0.7–4.0)
Monocytes Absolute: 0.5 10*3/uL (ref 0.1–1.0)
Monocytes Absolute: 0.6 10*3/uL (ref 0.1–1.0)
Monocytes Relative: 15 % — ABNORMAL HIGH (ref 3–12)
Monocytes Relative: 18 % — ABNORMAL HIGH (ref 3–12)
Neutro Abs: 1.6 10*3/uL — ABNORMAL LOW (ref 1.7–7.7)

## 2010-09-08 LAB — POCT I-STAT, CHEM 8
Calcium, Ion: 1.11 mmol/L — ABNORMAL LOW (ref 1.12–1.32)
Creatinine, Ser: 0.7 mg/dL (ref 0.4–1.2)
Hemoglobin: 12.9 g/dL (ref 12.0–15.0)
Sodium: 142 mEq/L (ref 135–145)
TCO2: 28 mmol/L (ref 0–100)

## 2010-09-08 LAB — COMPREHENSIVE METABOLIC PANEL
AST: 14 U/L (ref 0–37)
Albumin: 3 g/dL — ABNORMAL LOW (ref 3.5–5.2)
Alkaline Phosphatase: 98 U/L (ref 39–117)
Chloride: 104 mEq/L (ref 96–112)
GFR calc Af Amer: >60 mL/min (ref >60)
Potassium: 4 mEq/L (ref 3.5–5.1)
Sodium: 138 mEq/L (ref 135–145)
Total Bilirubin: 0.4 mg/dL (ref 0.3–1.2)
Total Protein: 5.9 g/dL — ABNORMAL LOW (ref 6.0–8.3)

## 2010-09-08 LAB — BASIC METABOLIC PANEL
BUN: 10 mg/dL (ref 6–23)
BUN: 11 mg/dL (ref 6–23)
Chloride: 105 mEq/L (ref 96–112)
Chloride: 106 mEq/L (ref 96–112)
GFR calc Af Amer: >60 mL/min (ref >60)
GFR calc non Af Amer: 56 mL/min — ABNORMAL LOW (ref >60)
GFR calc non Af Amer: >60 mL/min (ref >60)
Potassium: 4.7 mEq/L (ref 3.5–5.1)
Potassium: 4.8 mEq/L (ref 3.5–5.1)
Sodium: 142 mEq/L (ref 135–145)
Sodium: 142 mEq/L (ref 135–145)

## 2010-09-08 LAB — HEPATIC FUNCTION PANEL
ALT: 12 U/L (ref 0–35)
AST: 17 U/L (ref 0–37)
Bilirubin, Direct: 0.1 mg/dL (ref 0.0–0.3)
Total Bilirubin: 0.5 mg/dL (ref 0.3–1.2)

## 2010-09-08 LAB — CORTISOL: Cortisol, Plasma: 7.3 ug/dL

## 2010-09-08 LAB — URINALYSIS, ROUTINE W REFLEX MICROSCOPIC
Bilirubin Urine: NEGATIVE
Nitrite: NEGATIVE
Specific Gravity, Urine: 1.006 (ref 1.005–1.030)
Urobilinogen, UA: 0.2 mg/dL (ref 0.0–1.0)

## 2010-09-08 LAB — TSH: TSH: 3.446 u[IU]/mL (ref 0.350–4.500)

## 2010-09-08 LAB — T4, FREE: Free T4: 1.12 ng/dL (ref 0.80–1.80)

## 2010-09-09 ENCOUNTER — Encounter (HOSPITAL_BASED_OUTPATIENT_CLINIC_OR_DEPARTMENT_OTHER): Payer: Medicare Other | Admitting: Internal Medicine

## 2010-09-09 ENCOUNTER — Other Ambulatory Visit: Payer: Self-pay | Admitting: Internal Medicine

## 2010-09-09 DIAGNOSIS — J449 Chronic obstructive pulmonary disease, unspecified: Secondary | ICD-10-CM

## 2010-09-09 DIAGNOSIS — C3410 Malignant neoplasm of upper lobe, unspecified bronchus or lung: Secondary | ICD-10-CM

## 2010-09-09 DIAGNOSIS — C3490 Malignant neoplasm of unspecified part of unspecified bronchus or lung: Secondary | ICD-10-CM

## 2010-09-10 LAB — BASIC METABOLIC PANEL
BUN: 1 mg/dL — ABNORMAL LOW (ref 6–23)
BUN: 3 mg/dL — ABNORMAL LOW (ref 6–23)
BUN: 3 mg/dL — ABNORMAL LOW (ref 6–23)
CO2: 29 mEq/L (ref 19–32)
CO2: 30 mEq/L (ref 19–32)
CO2: 30 mEq/L (ref 19–32)
Calcium: 8.5 mg/dL (ref 8.4–10.5)
Calcium: 8.6 mg/dL (ref 8.4–10.5)
Chloride: 100 mEq/L (ref 96–112)
Chloride: 105 mEq/L (ref 96–112)
GFR calc non Af Amer: >60 mL/min (ref >60)
GFR calc non Af Amer: >60 mL/min (ref >60)
Glucose, Bld: 73 mg/dL (ref 70–99)
Glucose, Bld: 86 mg/dL (ref 70–99)
Glucose, Bld: 89 mg/dL (ref 70–99)
Glucose, Bld: 93 mg/dL (ref 70–99)
Potassium: 3.6 mEq/L (ref 3.5–5.1)
Potassium: 3.7 mEq/L (ref 3.5–5.1)
Sodium: 139 mEq/L (ref 135–145)
Sodium: 141 mEq/L (ref 135–145)
Sodium: 143 mEq/L (ref 135–145)

## 2010-09-10 LAB — COMPREHENSIVE METABOLIC PANEL
CO2: 27 mEq/L (ref 19–32)
Calcium: 8.9 mg/dL (ref 8.4–10.5)
Creatinine, Ser: 0.74 mg/dL (ref 0.4–1.2)
GFR calc Af Amer: >60 mL/min (ref >60)
GFR calc non Af Amer: >60 mL/min (ref >60)
Glucose, Bld: 88 mg/dL (ref 70–99)

## 2010-09-10 LAB — CROSSMATCH
ABO/RH(D): A POS
Antibody Screen: NEGATIVE
Unit division: 0
Unit division: 0
Unit division: 0

## 2010-09-10 LAB — CBC
HCT: 21.4 % — ABNORMAL LOW (ref 36.0–46.0)
HCT: 23.1 % — ABNORMAL LOW (ref 36.0–46.0)
HCT: 23.6 % — ABNORMAL LOW (ref 36.0–46.0)
HCT: 25 % — ABNORMAL LOW (ref 36.0–46.0)
Hemoglobin: 10.1 g/dL — ABNORMAL LOW (ref 12.0–15.0)
Hemoglobin: 7.4 g/dL — ABNORMAL LOW (ref 12.0–15.0)
Hemoglobin: 8.7 g/dL — ABNORMAL LOW (ref 12.0–15.0)
MCH: 31.3 pg (ref 26.0–34.0)
MCH: 32.6 pg (ref 26.0–34.0)
MCH: 32.7 pg (ref 26.0–34.0)
MCH: 33.4 pg (ref 26.0–34.0)
MCHC: 33.5 g/dL (ref 30.0–36.0)
MCHC: 33.5 g/dL (ref 30.0–36.0)
MCHC: 34.4 g/dL (ref 30.0–36.0)
MCHC: 34.6 g/dL (ref 30.0–36.0)
MCHC: 34.8 g/dL (ref 30.0–36.0)
MCV: 94.9 fL (ref 78.0–100.0)
MCV: 95.1 fL (ref 78.0–100.0)
Platelets: 556 10*3/uL — ABNORMAL HIGH (ref 150–400)
RDW: 14.9 % (ref 11.5–15.5)
RDW: 15.6 % — ABNORMAL HIGH (ref 11.5–15.5)
RDW: 16.1 % — ABNORMAL HIGH (ref 11.5–15.5)
RDW: 16.9 % — ABNORMAL HIGH (ref 11.5–15.5)
RDW: 17.1 % — ABNORMAL HIGH (ref 11.5–15.5)
WBC: 4.2 10*3/uL (ref 4.0–10.5)
WBC: 5.4 10*3/uL (ref 4.0–10.5)

## 2010-09-10 LAB — DIFFERENTIAL
Basophils Absolute: 0 10*3/uL (ref 0.0–0.1)
Basophils Absolute: 0 10*3/uL (ref 0.0–0.1)
Basophils Absolute: 0 10*3/uL (ref 0.0–0.1)
Basophils Absolute: 0 10*3/uL (ref 0.0–0.1)
Basophils Relative: 0 % (ref 0–1)
Basophils Relative: 0 % (ref 0–1)
Blasts: 0 %
Eosinophils Absolute: 0 10*3/uL (ref 0.0–0.7)
Eosinophils Absolute: 0.1 10*3/uL (ref 0.0–0.7)
Eosinophils Absolute: 0.1 10*3/uL (ref 0.0–0.7)
Eosinophils Relative: 2 % (ref 0–5)
Lymphocytes Relative: 14 % (ref 12–46)
Lymphocytes Relative: 19 % (ref 12–46)
Lymphs Abs: 0.6 10*3/uL — ABNORMAL LOW (ref 0.7–4.0)
Lymphs Abs: 0.7 10*3/uL (ref 0.7–4.0)
Lymphs Abs: 1 10*3/uL (ref 0.7–4.0)
Monocytes Absolute: 0.5 10*3/uL (ref 0.1–1.0)
Monocytes Absolute: 0.8 10*3/uL (ref 0.1–1.0)
Monocytes Absolute: 1 10*3/uL (ref 0.1–1.0)
Monocytes Relative: 16 % — ABNORMAL HIGH (ref 3–12)
Neutro Abs: 2.1 10*3/uL (ref 1.7–7.7)
Neutro Abs: 2.2 10*3/uL (ref 1.7–7.7)
Neutro Abs: 2.3 10*3/uL (ref 1.7–7.7)
Neutro Abs: 2.7 10*3/uL (ref 1.7–7.7)
Neutrophils Relative %: 59 % (ref 43–77)
Neutrophils Relative %: 64 % (ref 43–77)
nRBC: 0 /100 WBC

## 2010-09-10 LAB — PATHOLOGIST SMEAR REVIEW

## 2010-09-10 LAB — URINALYSIS, ROUTINE W REFLEX MICROSCOPIC
Glucose, UA: NEGATIVE mg/dL
Protein, ur: NEGATIVE mg/dL
Specific Gravity, Urine: 1.014 (ref 1.005–1.030)
pH: 7 (ref 5.0–8.0)

## 2010-09-10 LAB — HEMOGLOBIN AND HEMATOCRIT, BLOOD
HCT: 19.3 % — ABNORMAL LOW (ref 36.0–46.0)
HCT: 24.6 % — ABNORMAL LOW (ref 36.0–46.0)
Hemoglobin: 8.5 g/dL — ABNORMAL LOW (ref 12.0–15.0)
Hemoglobin: 8.6 g/dL — ABNORMAL LOW (ref 12.0–15.0)

## 2010-09-10 LAB — RETICULOCYTES
RBC.: 2.59 MIL/uL — ABNORMAL LOW (ref 3.87–5.11)
Retic Count, Absolute: 77.7 10*3/uL (ref 19.0–186.0)
Retic Ct Pct: 3 % (ref 0.4–3.1)

## 2010-09-10 LAB — BLOOD GAS, ARTERIAL
Bicarbonate: 28.3 mEq/L — ABNORMAL HIGH (ref 20.0–24.0)
FIO2: 0.21 %
O2 Saturation: 90.5 %
Patient temperature: 98.6

## 2010-09-10 LAB — FERRITIN: Ferritin: 195 ng/mL (ref 10–291)

## 2010-09-10 LAB — MAGNESIUM: Magnesium: 2.2 mg/dL (ref 1.5–2.5)

## 2010-09-10 LAB — PROTIME-INR: Prothrombin Time: 15.1 seconds (ref 11.6–15.2)

## 2010-09-10 LAB — URINE CULTURE: Colony Count: NO GROWTH

## 2010-09-10 LAB — IRON AND TIBC
Iron: 34 ug/dL — ABNORMAL LOW (ref 42–135)
TIBC: 217 ug/dL — ABNORMAL LOW (ref 250–470)

## 2010-09-10 LAB — FOLATE RBC: RBC Folate: 1666 ng/mL — ABNORMAL HIGH (ref 180–600)

## 2010-09-10 LAB — VITAMIN B12: Vitamin B-12: 246 pg/mL (ref 211–911)

## 2010-09-10 LAB — CORTISOL: Cortisol, Plasma: 9.9 ug/dL

## 2010-09-10 LAB — RPR: RPR Ser Ql: NONREACTIVE

## 2010-09-10 LAB — CARDIAC PANEL(CRET KIN+CKTOT+MB+TROPI): CK, MB: 0.4 ng/mL (ref 0.3–4.0)

## 2010-09-11 LAB — POCT I-STAT, CHEM 8
Calcium, Ion: 1.14 mmol/L (ref 1.12–1.32)
Creatinine, Ser: 0.5 mg/dL (ref 0.4–1.2)
Glucose, Bld: 78 mg/dL (ref 70–99)
Hemoglobin: 12.2 g/dL (ref 12.0–15.0)
Sodium: 140 mEq/L (ref 135–145)
TCO2: 28 mmol/L (ref 0–100)

## 2010-09-12 LAB — CBC
MCH: 32.5 pg (ref 26.0–34.0)
MCHC: 34.4 g/dL (ref 30.0–36.0)
Platelets: 343 10*3/uL (ref 150–400)
RBC: 3.41 MIL/uL — ABNORMAL LOW (ref 3.87–5.11)
RDW: 13.6 % (ref 11.5–15.5)

## 2010-09-14 LAB — GLUCOSE, CAPILLARY: Glucose-Capillary: 96 mg/dL (ref 70–99)

## 2010-09-15 LAB — AFB CULTURE WITH SMEAR (NOT AT ARMC)

## 2010-09-15 LAB — CULTURE, RESPIRATORY W GRAM STAIN

## 2010-09-15 LAB — PROTIME-INR
INR: 1.02 (ref 0.00–1.49)
Prothrombin Time: 13.3 seconds (ref 11.6–15.2)

## 2010-09-15 LAB — CBC
MCHC: 34.7 g/dL (ref 30.0–36.0)
Platelets: 338 10*3/uL (ref 150–400)
RDW: 14.1 % (ref 11.5–15.5)

## 2010-09-15 LAB — FUNGUS CULTURE W SMEAR

## 2010-10-09 ENCOUNTER — Ambulatory Visit: Payer: Medicare Other | Attending: Radiation Oncology | Admitting: Radiation Oncology

## 2010-10-16 ENCOUNTER — Ambulatory Visit: Payer: Medicare Other | Attending: Radiation Oncology | Admitting: Radiation Oncology

## 2010-11-11 NOTE — Consult Note (Signed)
NEW PATIENT CONSULTATION   Maria Lucero, Maria Lucero  DOB:  [birth year]                                        January 02, 2010  CHART #:  [id number]   REASON FOR CONSULTATION:  Large left lung mass with hypermetabolic  activity on PET scan and biopsy-proven invasive adenocarcinoma.   PRESENT ILLNESS:  The patient is a 60 year old out Caucasian female  smoker, who presented earlier this year to Dr. Maple Hudson with complaints of  a hemoptysis in a chest x-ray and CT scan showing a large left lung  mass.  There is no evidence of hilar nodal involvement or mediastinal  nodal involvement.  Bronchoscopy revealed atypical cells and a  subsequent transthoracic biopsy documented adenocarcinoma.  The patient  by scans would require a left pneumonectomy for a surgical margin.  She  has no evidence of distant metastatic disease.  A brain MRI is pending.  She continues to smoke half a pack to one pack of cigarettes a day.  Formal PFTs were obtained in the lab which indicate FEV1 of 1.3, 56% of  predicted.  She presents now for an opinion regarding resectability.  She has had some recurrent hemoptysis over the past 6 weeks, however,  continues to smoke.   PAST MEDICAL HISTORY:  1. COPD with active smoking.  2. Hypercholesterolemia.  3. Anxiety depression disorder.  4. Status post back surgery x4 as well as hysterectomy and      appendectomy.   HOME MEDICATIONS:  1. ProAir inhaler.  2. Ativan 1 mg q.i.d. p.r.n.  3. Lisinopril/HCTZ 10/12.5 mg daily.  4. Bupropion 150 mg tablets daily.  5. Seroquel 25 mg daily.  6. Imitrex tablets as needed for migraine headaches.  7. Cymbalta 60 mg t.i.d.  8. Depakote 500 mg p.o. 3 tablets nightly.  9. Premarin.   ALLERGIES:  To morphine, aspirin, codeine, and Biaxin.   SOCIAL HISTORY:  She is married with children.  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   PHYSICAL  EXAMINATION:  Vital Signs:  Blood pressure 145/80, pulse 80,  respirations 18, saturation on room air 94%, height 5 feet 4 inches, and  weight 133 pounds.  General:  She is an anxious middle-aged Caucasian  female, in no acute distress.  HEENT:  Normocephalic.  Neck:  Without  mass or palpable adenopathy.  Lungs:  Breath sounds are with scattered  rhonchi.  Cardiac:  Regular rhythm without murmur.  Extremities:  Some  mild clubbing, but no edema or tenderness.  Neurologic:  Intact without  focal motor deficit.   LABORATORY DATA:  We reviewed her CT scan, PET scans, and her pathology  report.   IMPRESSION:  I discussed the situation with the patient and her husband.  She has a large tumor involving a significant portion of both the upper  and left lower lobe and required left pneumonectomy.  With FEV1 of 1.3,  her pulmonary reserve would be inadequate for pneumonectomy.  As well,  the patient at this point is not willing to consider surgery, refuses  surgery, and is much for comfortable with chemotherapy which will be  discussed with the patient by Dr. Arbutus Ped.  I told her we would be able  to readdress the surgery later if her PFTs  improved after an extended  period of total smoking cessation.   Kerin Perna, M.D.  Electronically Signed   PV/MEDQ  D:  01/02/2010  T:  01/03/2010  Job:  [id number]   cc:   Joni Fears D. Maple Hudson, MD, FCCP, FACP

## 2010-11-14 NOTE — Consult Note (Signed)
[name], NAPOLITANO NO.:  [id number]   MEDICAL RECORD NO.:  [id number]          PATIENT TYPE:  INP   LOCATION:  5704                         FACILITY:  [facility]   PHYSICIAN:  Antonietta Breach, M.D.  DATE OF BIRTH:  February 10, 1951   DATE OF CONSULTATION:  06/10/2006  DATE OF DISCHARGE:                                 CONSULTATION   ADDENDUM TO CONSULTATION:   REQUESTING PHYSICIAN:  Dr. Crista Curb.   PHYSICAL EXAMINATION:  VITAL SIGNS:  Temperature:  Afebrile.  Pulse 105.  Respirations 22.  Blood pressure of 118/55.   MENTAL STATUS EXAM:  Maria Lucero is a middle-aged female appearing  younger than her stated age, lying in the supine position in her  hospital bed with psychomotor slowing, but good eye contact and  alertness.  Her affect is constricted.  Her mood is depressed.  She is  oriented completely to all spheres.  Her memory is intact to immediate,  recent, remote except the sedative period mentioned above.  Her speech  involves normal rate and prosody.  Thought process:  Logical, coherent,  goal-directed, no looseness of associations.  Thought content:  No  thoughts of harming herself, no thoughts of harming others, no  delusions, no hallucinations.  Her fund of knowledge and intelligence  are within normal limits.  Her concentration is mildly decreased.  Her  insight is good.  Her judgment is intact.      Antonietta Breach, M.D.  Electronically Signed     JW/MEDQ  D:  06/10/2006  T:  06/11/2006  Job:  [id number]

## 2010-11-14 NOTE — Discharge Summary (Signed)
NAMETERIN, Maria Lucero                ACCOUNT NO.:  [id number]   MEDICAL RECORD NO.:  [id number]          PATIENT TYPE:  INP   LOCATION:  5704                         FACILITY:  [facility]   PHYSICIAN:  Corinna L. Lendell Caprice, MDDATE OF BIRTH:  10/08/1950   DATE OF ADMISSION:  06/07/2006  DATE OF DISCHARGE:  06/12/2006                               DISCHARGE SUMMARY   DISCHARGE DIAGNOSES:  1. Chronic obstructive pulmonary disease exacerbation.  2. Pneumonia.  3. Klebsiella oxytoca urinary tract infection.  4. Acute delirium secondary to infection and medication effect.  5. Chronic pain.  6. History of migraine.  7  Resolved hypotension.  8  Resolved hypoxia.  9  Tobacco abuse, counseled against.  1. Bipolar disorder.  2. Hyperlipidemia.  3. Elevated ammonia level, question significant.   DISCHARGE MEDICATIONS:  She is to hold her Avandia, she is to decrease  her Klonopin to 0.5 mg p.o. t.i.d. as needed rather than 1.5 mg p.o.  t.i.d.  She is to stop hydroxyzine. She has been started on Seroquel 25  mg a day. She may continue Lyrica 75 mg p.o. b.i.d., Paxil 20 mg a day,  Lipitor 40 mg a day, Premarin 0.65 mg a day, Depakote ER 250 mg a day,  Wellbutrin SR 150 mg p.o. b.i.d., Etodolac 400 mg p.o. b.i.d.,  Omeprazole 20 mg a day, morphine IR 15 mg p.o. q.3h. p.r.n., albuterol  as needed, Allegra.   FOLLOW UP:  She is to follow-up with Dr. Tiburcio Pea next week for adjustment  in her medications and to evaluate her mental status/level of  consciousness.   CONDITION ON DISCHARGE:  Stable.   DISCHARGE INSTRUCTIONS:  Activity ad lib.  Diet should be low  cholesterol.   CONSULTATIONS:  Antonietta Breach, M.D.   PROCEDURE:  None.   PERTINENT LABORATORY DATA:  CBC on admission was unremarkable.  Complete  metabolic panel on admission was significant for an albumin of 3.1.  Valproic acid level 35.  Ammonia was 42.  INR normal.   SPECIAL STUDIES AND RADIOLOGY:  EKG showed normal sinus rhythm.   Chest x-  ray showed probable bibasilar atelectasis, cannot exclude pneumonia. CT  brain without contrast showed nothing acute, but there is atrophic  changes involving the frontal and temporal lobes consistent with the  patient's age.   HISTORY AND HOSPITAL COURSE:  Maria Lucero is a 60 year old white female  patient of Dr. Leonides Sake who was seen in his office the day of  admission.  Apparently she was very somnolent and had been short of  breath and was wheezing.  She is a two pack a day smoker and she is on  multiple medications which can be sedating.  Please see H&P for complete  admission details.   I evaluated the patient in the emergency room. The patient was extremely  somnolent. She would open her eyes briefly, but unable to follow any  commands, give any history, and immediately would fall back asleep.  She  was hypotensive and her blood pressure initially was 111/69 but dropped  to 75 and she required multiple boluses of  IV fluids.  She also had a  drop in her oxygen saturations, initially she was 88% on room air but  subsequently had to have her oxygen titrated upward because she remained  in the 80s.  Her heart rate and temperature were normal.  She was  wheezing and had a prolonged expiratory phase on lung exam. She had dry  mucous membranes. The chest x-ray showed atelectasis versus pneumonia  and she was treated as a pneumonia and COPD exacerbation with,  initially, Avelox.  She was admitted to the step-down unit due to her  abnormal vital signs.  She was also found to have a urinary tract  infection and her urine culture grew out 50,000 colonies of Klebsiella  oxytoca which was sensitive to fluoroquinolone. She was switched to  levofloxacin 750 mg, which would cover both the left lung and urine  pathogens. She was also given nebulizer treatments.  Her blood pressure,  wheezing, and hypoxemia, all improved and at the time of discharge she  had normal oxygen saturations  even while ambulating and had no wheezes,  whatsoever. She has completed her course of antibiotics during her  hospitalization.   With respect to her somnolence, I suspect this is multifactorial.  She  may have had an element of toxic encephalopathy but I am very concerned  about the amount of sedating medications she was on.  Initially, I held  her Klonopin and all opiate analgesics.  I had also decreased her  Depakote to 250 mg a day as her ammonia level was noted to be a slightly  high. She did not have any asterixis but she was quite tremulous. The  patient reports to me that she has been tremulous for some time now. She  also was started on lactulose once a day.  It took her several days to  fully come to a normal mental status.  She did not recall even who I was  for four days and just upon admission did she have normal recall.  Eventually, I did add Ativan back at small doses because I was concerned  about benzodiazepine withdrawal, particularly due to her tremulousness.  Also, she complained of headache and I did add small doses of Vicodin  back.  At this time, she is feeling well in terms of anxiety and pain,  so hopefully, she will not require up titration of her medications back.  I would recommend repeating an ammonia level in 2-4 weeks and if it is  still elevated, I would consider stopping the Depakote altogether. I  suspect that the ammonia level may be high due to this.   Also on admission, her husband had voiced concerns about the patient's  psych history.  She has a history of bipolar disorder and he reported  that she had not seen a psychiatrist for several years and did not have  an appointment until the end of February.  He had also are reported that  she had voiced some feelings of despair and possibly even suicidal  thoughts. When the patient finally woke up, she denied any suicidal ideation but I did consult Dr. Jeanie Sewer who agreed with cutting down on  her  sedative medications and the Depakote and also starting Seroquel at  night.  Please see his consult for further details.   Total time on the day of discharge is 45 minutes.      Corinna L. Lendell Caprice, MD  Electronically Signed    CLS/MEDQ  D:  06/12/2006  T:  06/12/2006  Job:  [id number]   cc:   Holley Bouche, M.D.
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
## 2010-11-14 NOTE — H&P (Signed)
[facility]  Patient:    Maria Lucero, Maria Lucero. Visit Number: [id number] MRN: [id number]          Service Type: Attending:  Patricia Nettle, M.D. Dictated by:   Druscilla Brownie. Shela Nevin, P.A. Adm. Date:  04/22/01   CC:         Arvella Merles, M.D.   History and Physical  DATE OF BIRTH:  24-Nov-2050  CHIEF COMPLAINT:  "Pain in my back and in my left leg."  HISTORY OF PRESENT ILLNESS:  This is a 60 year old female who has been seen by [place] for continuing problems concerning her low back with radiculitis into the lower extremities.  She has disabling back and leg pain.  She can ambulate only a short distance and must use a cane for ambulation.  The only way that she is able to get about is by taking OxyContin 20 mg one b.i.d.  Discogram and MRIs have been reviewed with the patient.  She has abnormal morphology from L3-S1.  She has had multiple back surgeries in the past and now is left with continuous back pain.  After much consideration, discussion, review of all films, and the fact that the patient has really not responded to any conservative care for her recent problem, it was felt that she would benefit from surgical intervention.  She is being admitted for decompression of L3-S1 with posterolateral interbody fusion of L3-S1 and pedicle screws and possible posterolateral interbody fusion at L4-5 and L5-S1.  PAST MEDICAL HISTORY:  The patient has had three back surgeries, one in 1997 and two in 2001.  She is also being treated for bipolar disorder.  Arvella Merles, M.D., of [facility] is her family physician.  CURRENT MEDICATIONS: 1. Zyprexa 10 mg one q.d. 2. Depakote 250 mg q.d. 3. Celexa 30 mg q.d. 4. Clonazepam 1 mg q.d. 5. Lipitor 40 mg one q.d. 6. Wellbutrin one q.d. 7. Advair q.d. 8. Allegra q.d. 9. Advair inhaler.  ALLERGIES:  She is allergic to BIAXIN, ASPIRIN, and CODEINE.  SOCIAL HISTORY:  The patient is married.  She has no  intake of ETOH products. She admits to smoking one pack of cigarettes per day.  FAMILY HISTORY:  Positive for cancer, heart attack, and kidney disease.  REVIEW OF SYSTEMS:  CNS:  No seizure disorder, paralysis, or double vision. The patient does have radiculitis secondary to lumbar nerve irritation down the lower extremities.  Cardiovascular:  No chest pain.  No angina or orthopnea.  Respiratory:  The patient has chronic cough, cold, and shortness of breath with climbing stairs, etc.  Of course, she does have similar difficulty with her low back as well.  Gastrointestinal:  No nausea, vomiting, melena, or bloody stool.  Genitourinary:  No discharge, dysuria, or hematuria. Musculoskeletal:  Primarily as in the present illness.  PHYSICAL EXAMINATION:  An alert, cooperative, friendly, apprehensive, 60 year old white female.  She has a very slight tremor.  VITAL SIGNS:  Blood pressure 128/82, pulse 80, respirations 12.  HEENT:  Normocephalic.  PERRLA.  EOM intact.  The oropharynx is clear.  CHEST:  Clear to auscultation.  No rhonchi or rales.  HEART:  Regular rate and rhythm.  No murmurs are heard.  ABDOMEN:  Soft and nontender.  The liver and spleen are not felt.  GENITALIA:  Not done, not pertinent to present illness.  RECTAL:  Not done, not pertinent to present illness.  EXTREMITIES:  The patient has painful range of motion bilaterally of the  lower extremities, as well as the lumbar spine.  ADMISSION DIAGNOSES: 1. Degenerative disk disease at L3-S1 with spinal stenosis. 2. Bipolar disorder. 3. Asthma. 4. Emphysema. 5. Gastroesophageal reflux disease.  PLAN:  The patient will undergo revision decompression of L3-S1 with posterior fusion of L3-S1 with pedicle screws.  Possible posterolateral interbody fusion at L4-5 and L5-S1 with posterior iliac crest bone graft.  We will certainly ask W. Holley Bouche, M.D., or one of his associates to follow along with [place] during this  patients hospitalization should she have any medical problems. Dictated by:   Druscilla Brownie. Shela Nevin, P.A. Attending:  Patricia Nettle, M.D. DD:  04/12/01 TD:  04/12/01 Job: [id number]

## 2010-11-14 NOTE — Op Note (Signed)
[place]  Patient:    Maria Lucero, Maria Lucero Visit Number: [id number] MRN: [id number]          Service Type: SUR Location: [id number] Attending Physician:  Patricia Nettle Dictated by:   Patricia Nettle, M.D. Proc. Date: 04/27/01 Admit Date:  04/22/2001                             Operative Report  DIAGNOSES:  1. Left L5 radiculitis.  2. Status post revision decompression, L3 to sacrum, with posterolateral     fusion and pedicle screws and right posterior iliac crest bone graft.  3. Nicotine dependence.  4. Severe chronic obstructive pulmonary disease.  PROCEDURES:  1. Exploration of spinal fusion.  2. Exploration of left L5 nerve root.  3. Removal of left S1 pedicle screw.  SURGEON: Patricia Nettle, M.D.  ASSISTANT: Arlys John, [facility]  ESTIMATED BLOOD LOSS: 100 cc.  ANESTHESIA: General endotracheal.  COMPLICATIONS: None.  INDICATIONS FOR PROCEDURE: The patient is a 60 year old female, who is status post revision decompression and instrumented fusion of L3 to the sacrum four days ago.  Her postoperative course was complicated by a left L5 radiculitis and postoperative hypoxemia.  Medicine was consulted and she was treated with aggressive pulmonary toilette, nebulizers, and narcotic restriction.  Her pulmonary status improved.  She continued to complain of pain and decreased sensation in a left L5 pattern.  EHL strength was 4/5.  There was difficulty placing the left S1 pedicle screw and there was concern that this may be irritating the L5 root.  After discussing the risks and benefits of exploration with the patient and her husband it was decided to proceed with surgery.  She is taken back to the operating room for exploration of the left L5 root and possible removal of the S1 pedicle screw.  DESCRIPTION OF PROCEDURE: The patient was properly identified in the holding area and taken to the operating room.  She underwent  general endotracheal anesthesia without difficulty.  Care was taken to minimize the amount of narcotic used and to avoid overhydrating her.  She was placed prone onto the operating room table.  All bony prominences were padded.  She was given prophylactic antibiotics.  The staples were removed.  The back was prepped and draped in the usual sterile fashion.  The previous incision was opened up and all suture material removed.  Deep retractors were placed.  The tissue appeared viable.  The previously performed L5 laminotomy was exposed.  A probe was used to palpate the L5 pedicle and there was no breech of the pedicle screw placed at L5.  The foramen was patent.  At this point attention was directed toward the instrumentation.  The distal transverse connector was removed.  Using an in situ rod cutter the rod was cut just below the L5 screw anchor.  The S1 screw was then removed.  The transverse connector was then replaced.  Bone graft was packed into the lateral gutter between the L5 transverse process and the sacral ala.  The wound was copiously irrigated with antibiotic saline solution.  There was excellent hemostasis.  The deep fascia was closed using interrupted #1 Vicryl sutures.  The subcutaneous layer was brought together using 2-0 interrupted Vicryl sutures.  The skin was closed with interrupted 3-0 nylon sutures.  A sterile dressing was applied.  The patient was carefully turned into the supine position.  She was extubated without difficulty.  She was wiggling her toes, and was transferred to the recovery room in stable condition.  It should be noted that fluoroscopy was used to evaluate the instrumentation intraoperatively, verifying excellent placement of the remaining screws. Dictated by:   Patricia Nettle, M.D. Attending Physician:  Patricia Nettle. DD:  04/27/01 TD:  04/28/01 Job: [id number]

## 2010-11-14 NOTE — Discharge Summary (Signed)
[place]  Patient:    Maria Lucero, Maria Lucero Visit Number: [id number] MRN: [id number]          Service Type: SUR Location: [id number] Attending Physician:  Patricia Nettle Dictated by:   Della Goo, P.A.C. Admit Date:  04/22/2001 Discharge Date: 05/03/2001                             Discharge Summary  ADMISSION DIAGNOSES: 1. Degenerative disc disease and spinal stenosis L3-S1. 2. Bipolar disorder. 3. Asthma and emphysema per patient history. 4. Gastroesophageal reflux disease. 5. Tobacco abuse.  DISCHARGE DIAGNOSES:  1. Degenerative disc disease and spinal stenosis L3-S1.  2. Bipolar disorder.  3. Asthma and emphysema per patient history.  4. Gastroesophageal reflux disease.  5. Tobacco abuse.  6. Postoperative anemia requiring blood transfusion.  7. Left L5 radiculitis requiring exploration of the left L5 nerve root and     removal of S1 pedicle screw.  8. Hypercarbic hypoxia respiratory failure secondary to severe chronic     obstructive pulmonary disease/oversedation/bilateral aspiration lower lobe     pneumonitis.  9. Transient hyperkalemia, resolved. 10. Hypokalemia, resolved at discharge.  PROCEDURES: 1. On April 22, 2001, the patient underwent L3-4 decompression with L3-S1    fusion utilizing pedicle screws and right posterior iliac crest bone graft    performed by Patricia Nettle, M.D., assisted by Della Goo, P.A.C.,    under general anesthesia. 2. On April 27, 2001, the patient underwent exploration of spinal fusion    with exploration of the left L5 nerve root and removal of the left S1    pedicle screw performed by Dr. Noel Gerold, assisted by Ottie Glazier. Wynona Neat,    P.A.-C, under general anesthesia.  CONSULTATIONS:  Juan-Carlos Monguilod, M.D.  BRIEF HISTORY:  [name] is a 60 year old white female with long history of severe low back pain and radiculitis into the lower extremities. She has utilized narcotic  analgesics for her discomfort on a chronic basis.  The patient has undergone tests including MRI and discogram which show abnormal morphology from L3 to S1. Due to her significant spinal stenosis at these levels as well as continued symptoms of radiculitis in the lower extremities, it was felt she would require surgical intervention and was admitted for the procedure as stated above.  BRIEF HOSPITAL COURSE:  The patient tolerated the procedures without complications.  On the first postoperatively day, the patient was noted to have drops in her O2 saturations.  She was on four liters of oxygen via nasal cannula.  The patient was using PCA analgesics as well as p.o. analgesics for her discomfort and continuing to have significant pain, specifically in the left lower extremity.  She was also noted to have some weakness of the left ankle dorsiflexion.  Due to her respiratory problems, a medical consult was obtained.  She was followed throughout the hospital stay by Dr. Jamie Brookes and his associates regarding her hypercarbic hypoxic respiratory failure secondary to severe COPD, oversedation and bilateral aspiration lower lobe pneumonitis. She required oxygen and treatment with pulmonary toilet.  She was also placed on Unasyn IV.  Chest x-rays were monitored.  She was felt to have severe COPD. She was instructed in smoking cessation and was given Wellbutrin to assist with her smoking cessation.  She was not allowed a nicotine patch as this would inhibit healing at the fusion site in her spine.  She was  also treated for transient hyperkalemia which resolved.  Later in the hospital stay, she also suffered from hypokalemia which was corrected.  The patient required a transfusion postoperatively secondary to post hemorrhagic anemia.  She continued over the next several days to have increasing radiculitis in the left lower extremity. She had weakness of the EHL and also some decreased sensation in the  L5 dermatome on the left.  It was felt that she would require exploration of the fusion site and possible removal of the left S1 pedicle screw.  Unfortunately, her medical status did not allow her to go back to surgery immediately.  She continued treatment for her respiratory difficulties over the next few days with her hypoxia improving gradually.  Eventually she was felt stable for return to the operating room and was placed on the schedule.  While awaiting this time, she was initially kept on an NPO status and then changed to a liquid diet.  She had no signs of ileus while in the hospital. She was fitted with an LSO support initially and then on April 28, 2001, was fitting with a TLSO with thigh cuff.  Attempts at ambulating and increased activity were unsuccessful prior to her second surgery as she had such significant discomfort.  The patient eventually was taken back to the operating room and on April 27, 2001, underwent exploration of the L5 fusion and removal of the left S1 pedicle screw. Following the procedure, she did have relief of her radicular symptoms.  She continued to have some leg pain; however, she had improvement in her weakness. She continued to utilize narcotic analgesics including OxyContin and Vicodin for her discomfort.  Her O2 sats remained at 97% on two liters of oxygen and she continued with pulmonary toilet.  Dressing changes were done daily and wound was healing well without difficulties.  She was started on physical therapy to be out of bed utilizing her brace.  She tolerated the TLSO fairly well.  On April 30, 2001, the medical service signed off, feeling that the patient was stable.  On May 03, 2001, the patient was felt to be stable for discharge to her home. At that time she was afebrile with vital signs stable.  She was 91% on room air.  She continued to have mild weakness of the left EHL; however, sensation was improving. Functionally, she was  able to be out of bed ambulating utilizing her walker and her brace.  PERTINENT LABORATORY VALUES:  Blood gases were monitored throughout the  hospital stay.  She was treated for her hypoxia and hypercarbia.  Hemoglobin dropped to the lowest value of 7.0 and returned to 11.0 after transfusion. The last hemoglobin was 10.8 on April 30, 2001.  The patient suffered from hyperkalemia initially postoperatively with a value of 8.2. This was corrected and then she developed hypokalemia at 3.2.  Prior to discharge, her potassium was normal.  EKG on admission showed normal sinus rhythm, nonspecific T-wave abnormality, prolonged QT, abnormal EKG with no old tracings for comparison, confirmed by Runell Gess, M.D.  Chest x-ray on April 15, 2001, showed stable chest.  Scarring was noted within the lung bases.  Chest x-ray on April 23, 2001, showed bibasilar air space opacities with some volume loss at the right lung base.  Mild interstitial opacity in the peripheral region on the left.  Cannot exclude aspiration pneumonitis or infiltrate.  Repeat on April 24, 2001, showed an improved aeration of the left lower lobe, persistent atelectasis versus infiltrate  of the right lower lobe.  On April 26, 2001, repeat chest x-ray showed improved bilateral lower lobe air space disease/atelectasis. Peribronchial thickening unchanged.  PLAN:  The patient was discharged to her home.  She will have family available to assist with her care.  She was advised on smoking cessation and will try to continue the smoking cessation program.  She will continue to ambulate out of bed using a walker and wearing her brace. She will follow up with Dr. Noel Gerold in seven to 10 days and has been advised to call the office to arrange the appointment.  Dressing change will be done daily and the patient will be allowed to shower at home.  PRESCRIPTIONS: 1. OxyContin 20 mg one p.o. b.i.d. 2. Vicodin one every four to  six hours as needed for breakthrough pain. 3. Robaxin one every eight hours as needed for spasm. 4. She will resume all home medications as taken prior to admission.  DISCHARGE DIET:  She will continue on a regular diet.  DISCHARGE INSTRUCTIONS:  She has been advised to call the office if there are questions or concerns prior to her return office visit.  She has been instructed by the medical physicians to follow up with Dr. Tiburcio Pea, her usual medical physician, in a timely manner regarding her respiratory status.  The instructions were given to the patient and her husband and questions were encouraged and answered.  CONDITION ON DISCHARGE:  Stable. Dictated by:   Della Goo, P.A.C. Attending Physician:  Patricia Nettle. DD:  05/16/01 TD:  05/16/01 Job: [id number]

## 2010-11-14 NOTE — H&P (Signed)
Maria Lucero, Maria Lucero                ACCOUNT NO.:  [id number]   MEDICAL RECORD NO.:  [id number]          PATIENT TYPE:  INP   LOCATION:  2917                         FACILITY:  [facility]   PHYSICIAN:  Corinna L. Lendell Caprice, MDDATE OF BIRTH:  1950-09-12   DATE OF ADMISSION:  06/07/2006  DATE OF DISCHARGE:                              HISTORY & PHYSICAL   CHIEF COMPLAINT:  Somnolence.   HISTORY OF PRESENT ILLNESS:  Maria Lucero is a 60 year old, white female  who was sent to the Emergency Room after being seen by Dr. Tiburcio Pea this  morning.  He felt that she had a COPD exacerbation and was concerned  about her somnolence.  The patient is unable to provide much history so  all history is per the husband.  The patient has a history of chronic  pain and bipolar disorder.  He was concerned that she was taking her  medications incorrectly because she has been very sleepy for the past 3  weeks.  Therefore, he started dispensing medications over the past week,  but she has remained sleepy.  She has also had a cough and was  reportedly hypoxic in the office.  The patient has a history of COPD and  continues to smoke 2 packs of cigarettes a day since the age of 54.  In  addition, the patient's husband is concerned about her psychiatric well  being.  He reports that she has refused to see a psychiatrist for  years and does not have an appointment scheduled until the end of  February.  He reports that she voiced some feelings of depression and  wanting to end it all last week.  She reportedly had no plan.   PAST MEDICAL HISTORY:  1. Bipolar disorder.  2. Chronic pain.  3. Migraines.  4. COPD.  5. Continued tobacco abuse.   MEDICATIONS:  Per records from Dr. Tiburcio Pea' office.   1. Paxil 20 mg a day.  2. Lipitor 40 mg a day.  3. Premarin 0.625 mg a day.  4. Depakote ER 750 mg p.o. daily.  5. Lyrica 75 mg p.o. b.i.d.  6. Buspirone SR 150 mg p.o. b.i.d.  7. Klonopin 1.5 mg.  It is unclear whether she  is taking this or      whether this has been discontinued.  8. Etodolac 400 mg p.o. b.i.d.  9. Omeprazole 20 mg a day.  10.Avinza daily.  11.Morphine IR 15 mg every 2-3 hours.  12.Phenergan as needed.  13.Albuterol as needed.  14.Imitrex as needed.  15.Allegra as needed.   SOCIAL HISTORY:  The patient is married.  There is a history of smoking  as above.  No history of illicit drug use or alcohol abuse.   FAMILY HISTORY:  Noncontributory.   REVIEW OF SYSTEMS:  Unable to obtain.   PHYSICAL EXAMINATION:  VITAL SIGNS:  Temperature 97.5, blood pressure  111/69, pulse 77, respiratory rate 18, oxygen saturation 88% on room air  but her blood pressure dropped to 75 when I was standing in the room.  GENERAL:  The patient is a very somnolent-appearing, white female  who  aroused briefly but immediately falls back asleep.  She will follow a  few commands before she falls back asleep.  HEENT:  Normocephalic, atraumatic.  Pupils equal, round, and reactive to  light.  No facial drooping.  She has dry mucous membranes.  NECK:  Supple.  No lymphadenopathy.  No JVD.  LUNGS:  She has mild wheezes throughout with a prolonged expiratory  phase.  CARDIOVASCULAR:  Regular rate and rhythm without murmurs, gallops or  rubs.  ABDOMEN:  Normal bowel sounds.  Soft, nontender, and nondistended.  GU:  Deferred.  RECTAL:  Deferred.  EXTREMITIES:  No clubbing, cyanosis, or edema.  Pulses are intact.  NEUROLOGIC:  Somnolent as above.  She is moving all 4 extremities and  has no obvious cranial nerve deficits, but she cannot fully cooperate  with exam.  SKIN:  No rash.  PSYCHIATRIC:  The patient is calm.   CBC unremarkable.  Complete metabolic panel unremarkable.  Urine drug  screen positive for benzodiazepines and opiates.  X-ray shows probable  bibasilar atelectasis, cannot exclude pneumonia.   ASSESSMENT/PLAN:  1. Probable pneumonia given her cough and wheezing.  I will give      intravenous  Avelox.  2. Chronic obstructive pulmonary disease.  I will give handheld      nebulizers, and she will need a smoking cessation counselor.      Counseling when she is more alert.  3. Altered mental status.  Suspect medication related but I will check      arterial blood gas to rule out hypercarbia.  I will hold all her      sedating medications.  4. Hypotension.  She appears volume depleted on exam, and I will give      intravenous fluids and admit to the Step-Down Unit for close      monitoring.  5. Bipolar disorder.  Once she is more alert, I will most likely      consult Psychiatry as she has voiced some vague suicidal thoughts      recently.  I do not feel that she is a suicide risk at this time.  6. Chronic pain.  See above.      Corinna L. Lendell Caprice, MD  Electronically Signed     CLS/MEDQ  D:  06/07/2006  T:  06/08/2006  Job:  [id number]   cc:   Holley Bouche, M.D.

## 2010-12-08 ENCOUNTER — Other Ambulatory Visit: Payer: Self-pay | Admitting: Internal Medicine

## 2010-12-08 ENCOUNTER — Other Ambulatory Visit (HOSPITAL_COMMUNITY): Payer: Medicare Other

## 2010-12-08 DIAGNOSIS — C3490 Malignant neoplasm of unspecified part of unspecified bronchus or lung: Secondary | ICD-10-CM

## 2010-12-12 ENCOUNTER — Ambulatory Visit (HOSPITAL_COMMUNITY)
Admission: RE | Admit: 2010-12-12 | Discharge: 2010-12-12 | Disposition: A | Discharge status: Home / Self Care | Payer: Medicare Other | Source: Ambulatory Visit | Attending: Internal Medicine | Admitting: Internal Medicine

## 2010-12-12 ENCOUNTER — Encounter (HOSPITAL_COMMUNITY): Payer: Self-pay

## 2010-12-12 ENCOUNTER — Other Ambulatory Visit: Payer: Self-pay | Admitting: Internal Medicine

## 2010-12-12 ENCOUNTER — Encounter (HOSPITAL_BASED_OUTPATIENT_CLINIC_OR_DEPARTMENT_OTHER): Payer: Medicare Other | Admitting: Internal Medicine

## 2010-12-12 DIAGNOSIS — R059 Cough, unspecified: Secondary | ICD-10-CM | POA: Insufficient documentation

## 2010-12-12 DIAGNOSIS — C3410 Malignant neoplasm of upper lobe, unspecified bronchus or lung: Secondary | ICD-10-CM

## 2010-12-12 DIAGNOSIS — J984 Other disorders of lung: Secondary | ICD-10-CM | POA: Insufficient documentation

## 2010-12-12 DIAGNOSIS — R05 Cough: Secondary | ICD-10-CM | POA: Insufficient documentation

## 2010-12-12 DIAGNOSIS — J4489 Other specified chronic obstructive pulmonary disease: Secondary | ICD-10-CM | POA: Insufficient documentation

## 2010-12-12 DIAGNOSIS — J449 Chronic obstructive pulmonary disease, unspecified: Secondary | ICD-10-CM | POA: Insufficient documentation

## 2010-12-12 DIAGNOSIS — C3490 Malignant neoplasm of unspecified part of unspecified bronchus or lung: Secondary | ICD-10-CM | POA: Insufficient documentation

## 2010-12-12 DIAGNOSIS — Z981 Arthrodesis status: Secondary | ICD-10-CM | POA: Insufficient documentation

## 2010-12-12 DIAGNOSIS — Z5111 Encounter for antineoplastic chemotherapy: Secondary | ICD-10-CM

## 2010-12-12 DIAGNOSIS — R0602 Shortness of breath: Secondary | ICD-10-CM | POA: Insufficient documentation

## 2010-12-12 LAB — CMP (CANCER CENTER ONLY)
ALT(SGPT): 12 U/L (ref 10–47)
CO2: 29 mEq/L (ref 18–33)
Calcium: 9.4 mg/dL (ref 8.0–10.3)
Chloride: 99 mEq/L (ref 98–108)
Glucose, Bld: 87 mg/dL (ref 73–118)
Sodium: 141 mEq/L (ref 128–145)
Total Bilirubin: 0.4 mg/dl (ref 0.20–1.60)
Total Protein: 8 g/dL (ref 6.4–8.1)

## 2010-12-12 LAB — CBC WITH DIFFERENTIAL/PLATELET
Basophils Absolute: 0 10*3/uL (ref 0.0–0.1)
Eosinophils Absolute: 0.2 10*3/uL (ref 0.0–0.5)
HGB: 12.4 g/dL (ref 11.6–15.9)
MCV: 95.7 fL (ref 79.5–101.0)
MONO#: 0.6 10*3/uL (ref 0.1–0.9)
MONO%: 9.3 % (ref 0.0–14.0)
NEUT#: 3.7 10*3/uL (ref 1.5–6.5)
Platelets: 403 10*3/uL — ABNORMAL HIGH (ref 145–400)
RDW: 13.4 % (ref 11.2–14.5)
WBC: 6 10*3/uL (ref 3.9–10.3)

## 2010-12-12 MED ORDER — IOHEXOL 300 MG/ML  SOLN
80.0000 mL | Freq: Once | INTRAMUSCULAR | Status: AC | PRN
  Administered 2010-12-12: 80 mL via INTRAVENOUS

## 2010-12-18 ENCOUNTER — Encounter (HOSPITAL_BASED_OUTPATIENT_CLINIC_OR_DEPARTMENT_OTHER): Payer: Medicare Other | Admitting: Internal Medicine

## 2010-12-18 DIAGNOSIS — C3410 Malignant neoplasm of upper lobe, unspecified bronchus or lung: Secondary | ICD-10-CM

## 2011-02-27 ENCOUNTER — Encounter: Payer: Self-pay | Admitting: Internal Medicine

## 2011-03-03 ENCOUNTER — Ambulatory Visit: Payer: Medicare Other | Admitting: Internal Medicine

## 2011-04-09 ENCOUNTER — Ambulatory Visit: Payer: Medicare Other | Admitting: Internal Medicine

## 2011-04-23 ENCOUNTER — Other Ambulatory Visit: Payer: Self-pay | Admitting: Internal Medicine

## 2011-04-23 ENCOUNTER — Encounter (HOSPITAL_BASED_OUTPATIENT_CLINIC_OR_DEPARTMENT_OTHER): Payer: Medicare Other | Admitting: Internal Medicine

## 2011-04-23 ENCOUNTER — Ambulatory Visit (HOSPITAL_COMMUNITY)
Admission: RE | Admit: 2011-04-23 | Discharge: 2011-04-23 | Disposition: A | Discharge status: Home / Self Care | Payer: Medicare Other | Source: Ambulatory Visit | Attending: Internal Medicine | Admitting: Internal Medicine

## 2011-04-23 DIAGNOSIS — R911 Solitary pulmonary nodule: Secondary | ICD-10-CM | POA: Insufficient documentation

## 2011-04-23 DIAGNOSIS — I319 Disease of pericardium, unspecified: Secondary | ICD-10-CM | POA: Insufficient documentation

## 2011-04-23 DIAGNOSIS — C3490 Malignant neoplasm of unspecified part of unspecified bronchus or lung: Secondary | ICD-10-CM

## 2011-04-23 DIAGNOSIS — C3410 Malignant neoplasm of upper lobe, unspecified bronchus or lung: Secondary | ICD-10-CM

## 2011-04-23 DIAGNOSIS — J438 Other emphysema: Secondary | ICD-10-CM | POA: Insufficient documentation

## 2011-04-23 DIAGNOSIS — Z4789 Encounter for other orthopedic aftercare: Secondary | ICD-10-CM | POA: Insufficient documentation

## 2011-04-23 LAB — CMP (CANCER CENTER ONLY)
AST: 11 U/L (ref 11–38)
Albumin: 3.3 g/dL (ref 3.3–5.5)
Alkaline Phosphatase: 77 U/L (ref 26–84)
BUN, Bld: 13 mg/dL (ref 7–22)
Potassium: 4.3 mEq/L (ref 3.3–4.7)
Sodium: 144 mEq/L (ref 128–145)
Total Protein: 7.5 g/dL (ref 6.4–8.1)

## 2011-04-23 LAB — CBC WITH DIFFERENTIAL/PLATELET
BASO%: 0.5 % (ref 0.0–2.0)
Basophils Absolute: 0 10*3/uL (ref 0.0–0.1)
Eosinophils Absolute: 0.2 10*3/uL (ref 0.0–0.5)
HCT: 33.5 % — ABNORMAL LOW (ref 34.8–46.6)
HGB: 11.2 g/dL — ABNORMAL LOW (ref 11.6–15.9)
MCHC: 33.5 g/dL (ref 31.5–36.0)
MONO#: 0.1 10*3/uL (ref 0.1–0.9)
NEUT#: 5 10*3/uL (ref 1.5–6.5)
NEUT%: 79.7 % — ABNORMAL HIGH (ref 38.4–76.8)
WBC: 6.3 10*3/uL (ref 3.9–10.3)
lymph#: 0.9 10*3/uL (ref 0.9–3.3)

## 2011-04-23 MED ORDER — IOHEXOL 300 MG/ML  SOLN
100.0000 mL | Freq: Once | INTRAMUSCULAR | Status: AC | PRN
  Administered 2011-04-23: 100 mL via INTRAVENOUS

## 2011-04-28 ENCOUNTER — Encounter (HOSPITAL_BASED_OUTPATIENT_CLINIC_OR_DEPARTMENT_OTHER): Payer: Medicare Other | Admitting: Internal Medicine

## 2011-04-28 ENCOUNTER — Other Ambulatory Visit: Payer: Self-pay | Admitting: Internal Medicine

## 2011-04-28 DIAGNOSIS — R911 Solitary pulmonary nodule: Secondary | ICD-10-CM

## 2011-04-28 DIAGNOSIS — Z85118 Personal history of other malignant neoplasm of bronchus and lung: Secondary | ICD-10-CM

## 2011-04-28 DIAGNOSIS — C3490 Malignant neoplasm of unspecified part of unspecified bronchus or lung: Secondary | ICD-10-CM

## 2011-08-24 ENCOUNTER — Ambulatory Visit (HOSPITAL_COMMUNITY)
Admission: RE | Admit: 2011-08-24 | Discharge: 2011-08-24 | Disposition: A | Discharge status: Home / Self Care | Payer: Medicare HMO | Source: Ambulatory Visit | Attending: Internal Medicine | Admitting: Internal Medicine

## 2011-08-24 ENCOUNTER — Other Ambulatory Visit (HOSPITAL_BASED_OUTPATIENT_CLINIC_OR_DEPARTMENT_OTHER): Payer: Medicare HMO | Admitting: Lab

## 2011-08-24 DIAGNOSIS — J701 Chronic and other pulmonary manifestations due to radiation: Secondary | ICD-10-CM | POA: Insufficient documentation

## 2011-08-24 DIAGNOSIS — X58XXXA Exposure to other specified factors, initial encounter: Secondary | ICD-10-CM | POA: Insufficient documentation

## 2011-08-24 DIAGNOSIS — C3410 Malignant neoplasm of upper lobe, unspecified bronchus or lung: Secondary | ICD-10-CM

## 2011-08-24 DIAGNOSIS — C3490 Malignant neoplasm of unspecified part of unspecified bronchus or lung: Secondary | ICD-10-CM

## 2011-08-24 DIAGNOSIS — R0602 Shortness of breath: Secondary | ICD-10-CM | POA: Insufficient documentation

## 2011-08-24 DIAGNOSIS — S2249XA Multiple fractures of ribs, unspecified side, initial encounter for closed fracture: Secondary | ICD-10-CM | POA: Insufficient documentation

## 2011-08-24 DIAGNOSIS — Y842 Radiological procedure and radiotherapy as the cause of abnormal reaction of the patient, or of later complication, without mention of misadventure at the time of the procedure: Secondary | ICD-10-CM | POA: Insufficient documentation

## 2011-08-24 DIAGNOSIS — R0789 Other chest pain: Secondary | ICD-10-CM | POA: Insufficient documentation

## 2011-08-24 DIAGNOSIS — T66XXXS Radiation sickness, unspecified, sequela: Secondary | ICD-10-CM | POA: Insufficient documentation

## 2011-08-24 DIAGNOSIS — R599 Enlarged lymph nodes, unspecified: Secondary | ICD-10-CM | POA: Insufficient documentation

## 2011-08-24 LAB — CBC WITH DIFFERENTIAL/PLATELET
Basophils Absolute: 0 10*3/uL (ref 0.0–0.1)
EOS%: 1.6 % (ref 0.0–7.0)
HGB: 11.5 g/dL — ABNORMAL LOW (ref 11.6–15.9)
LYMPH%: 12.1 % — ABNORMAL LOW (ref 14.0–49.7)
MCH: 32.9 pg (ref 25.1–34.0)
MCV: 97.1 fL (ref 79.5–101.0)
MONO%: 3 % (ref 0.0–14.0)
RDW: 13.9 % (ref 11.2–14.5)

## 2011-08-24 LAB — CMP (CANCER CENTER ONLY)
AST: 15 U/L (ref 11–38)
Alkaline Phosphatase: 71 U/L (ref 26–84)
BUN, Bld: 15 mg/dL (ref 7–22)
Creat: 0.8 mg/dl (ref 0.6–1.2)
Potassium: 4.3 mEq/L (ref 3.3–4.7)
Total Bilirubin: 0.5 mg/dl (ref 0.20–1.60)

## 2011-08-24 MED ORDER — IOHEXOL 300 MG/ML  SOLN
100.0000 mL | Freq: Once | INTRAMUSCULAR | Status: AC | PRN
  Administered 2011-08-24: 100 mL via INTRAVENOUS

## 2011-08-26 ENCOUNTER — Telehealth: Payer: Self-pay | Admitting: Internal Medicine

## 2011-08-26 ENCOUNTER — Ambulatory Visit (HOSPITAL_BASED_OUTPATIENT_CLINIC_OR_DEPARTMENT_OTHER): Payer: Medicare HMO | Admitting: Internal Medicine

## 2011-08-26 VITALS — BP 144/74 | HR 104 | Temp 97.8°F | Ht 64.0 in | Wt 157.8 lb

## 2011-08-26 DIAGNOSIS — C3490 Malignant neoplasm of unspecified part of unspecified bronchus or lung: Secondary | ICD-10-CM

## 2011-08-26 NOTE — Progress Notes (Signed)
[facility] [phone]   [phone]  OFFICE PROGRESS NOTE  Waymon Budge, MD, MD [address] [employer] [place] [address]  PRINCIPAL DIAGNOSIS:  Unresectable stage IIB (T3 N0 M0) non-small cell lung cancer, adenocarcinoma diagnosed in June 2011.  PRIOR THERAPY:   1. Status post concurrent chemoradiation with weekly carboplatin and paclitaxel.  Last dose was given February 24, 2010. 2. Consolidation chemotherapy with carboplatin and Alimta, status post 3 cycles.  Last dose was given June 09, 2010.  CURRENT THERAPY:  Observation.  INTERVAL HISTORY: Maria Lucero 61 y.o. female returns to the clinic today for 4 month followup visit accompanied her husband. The patient continues to have the baseline shortness of breath. She is on home oxygen 2 L per minute. He denied having any significant chest pain, no cough or hemoptysis. She has no weight loss. The patient has repeat CT scan of the chest performed recently and she is here today for evaluation and discussion of her scan results.  MEDICAL HISTORY: Past Medical History  Diagnosis Date  . lung ca dx'd 11/2009    chemo comp 05/2010  . Hypertension   . Migraine headache   . Anxiety   . Urticaria   . Low back pain syndrome   . Hyperlipidemia   . Hypercholesterolemia   . COPD (chronic obstructive pulmonary disease)   . Depression   . Tobacco abuse   . Myalgia and myositis, unspecified     ALLERGIES:  is allergic to avinza; methocarbamol; aspirin; clarithromycin; codeine; and morphine.  MEDICATIONS:  Current Outpatient Prescriptions  Medication Sig Dispense Refill  . albuterol (VENTOLIN HFA) 108 (90 BASE) MCG/ACT inhaler Inhale 2 puffs into the lungs every 6 (six) hours as needed.        Marland Kitchen buPROPion (WELLBUTRIN SR) 150 MG 12 hr tablet Take 150 mg by mouth 2 (two) times daily.        . clonazePAM (KLONOPIN) 1 MG tablet Take 1 mg by mouth. Take 1 to 2  tabs by mouth 3 to 4 times daily as needed       . divalproex (DEPAKOTE) 500 MG EC tablet Take 500 mg by mouth. 3 tabs by mouth at bedtime        . DULoxetine (CYMBALTA) 60 MG capsule Take 3 tabs by mouth daily       . estradiol (VIVELLE-DOT) 0.025 MG/24HR Place 1 patch onto the skin once a week.      . folic acid (FOLVITE) 400 MCG tablet Take 400 mcg by mouth daily.        Marland Kitchen HYDROcodone-homatropine (HYCODAN) 5-1.5 MG/5ML syrup Take by mouth every 6 (six) hours as needed.        Marland Kitchen omeprazole (PRILOSEC) 20 MG capsule Take 20 mg by mouth daily.        Marland Kitchen oxyCODONE-acetaminophen (PERCOCET) 10-325 MG per tablet Take 1 tablet by mouth every 4 (four) hours as needed.        Marland Kitchen QUEtiapine (SEROQUEL) 25 MG tablet Take 25 mg by mouth at bedtime.        . temazepam (RESTORIL) 30 MG capsule Take 30 mg by mouth at bedtime as needed.        . benzonatate (TESSALON) 200 MG capsule Take 200 mg by mouth 3 (three) times daily as needed.        Marland Kitchen lisinopril-hydrochlorothiazide (PRINZIDE,ZESTORETIC) 10-12.5 MG per tablet Take 1 tablet by mouth daily.        Marland Kitchen  SUMAtriptan (IMITREX) 100 MG tablet Take 100 mg by mouth every 2 (two) hours as needed.          SURGICAL HISTORY:  Past Surgical History  Procedure Date  . Back surgery     x 4 L3-S1 1996,2000, 2002  . Vesicovaginal fistula closure w/ tah   . Appendectomy   . Bronchoscopy 2011  . Lung biopsy 2011    needle    REVIEW OF SYSTEMS:  A comprehensive review of systems was negative except for: Constitutional: positive for fatigue Respiratory: positive for cough and dyspnea on exertion   PHYSICAL EXAMINATION: General appearance: alert, cooperative and no distress Neck: no adenopathy Lymph nodes: Cervical, supraclavicular, and axillary nodes normal. Resp: clear to auscultation bilaterally Cardio: regular rate and rhythm, S1, S2 normal, no murmur, click, rub or gallop GI: soft, non-tender; bowel sounds normal; no masses,  no organomegaly Extremities:  extremities normal, atraumatic, no cyanosis or edema  ECOG PERFORMANCE STATUS: 1 - Symptomatic but completely ambulatory  Blood pressure 144/74, pulse 104, temperature 97.8 F (36.6 C), temperature source Oral, height 5\' 4"  (1.626 m), weight 157 lb 12.8 oz (71.578 kg).  LABORATORY DATA: Lab Results  Component Value Date   WBC 6.7 08/24/2011   HGB 11.5* 08/24/2011   HCT 34.1* 08/24/2011   MCV 97.1 08/24/2011   PLT 343 08/24/2011      Chemistry      Component Value Date/Time   NA 145 08/24/2011 1430   NA 140 07/02/2010 0517   K 4.3 08/24/2011 1430   K 3.9 DELTA CHECK NOTED 07/02/2010 0517   CL 95* 08/24/2011 1430   CL 103 07/02/2010 0517   CO2 31 08/24/2011 1430   CO2 28 07/02/2010 0517   BUN 15 08/24/2011 1430   BUN 10 07/02/2010 0517   CREATININE 0.8 08/24/2011 1430   CREATININE 0.70 07/02/2010 0517      Component Value Date/Time   CALCIUM 8.6 08/24/2011 1430   CALCIUM 8.9 07/02/2010 0517   ALKPHOS 71 08/24/2011 1430   ALKPHOS 98 06/28/2010 0600   AST 15 08/24/2011 1430   AST 14 06/28/2010 0600   ALT 10 06/28/2010 0600   BILITOT 0.50 08/24/2011 1430   BILITOT 0.4 06/28/2010 0600       RADIOGRAPHIC STUDIES: Ct Chest W Contrast  08/24/2011  *RADIOLOGY REPORT*  Clinical Data: Lung cancer with chest pain, cough and shortness of breath.  CT CHEST WITH CONTRAST  Technique:  Multidetector CT imaging of the chest was performed following the standard protocol during bolus administration of intravenous contrast.  Contrast: OMNIPAQUE IOHEXOL 300 MG/ML IV SOLN  Comparison: 04/23/2011.  Findings: No pathologically enlarged mediastinal, right hilar or axillary lymph nodes.  Heart size normal.  Anterior pericardial thickening or small pericardial effusion, stable.  Severe centrilobular emphysema.  Radiation fibrosis, consolidation and volume loss in the perihilar left upper and  left lower lobes, stable. Right lower lobe nodule has increased in prominence, now measuring 6 mm (previously 3-4 mm when  remeasured).  2 mm anterior right upper lobe nodule (image 28) is unchanged. Focal ground-glass in the lateral right middle lobe (image 40) is stable and nonspecific.  No pleural fluid.  Airway is otherwise unremarkable.  Incidental imaging of the upper abdomen shows a small juxta diaphragmatic lymph node, stable.  Probable incompletely imaged focal fat in segment IV of the liver.  Postoperative changes are seen in the spine.  No worrisome lytic or sclerotic lesions.  There are additional left lateral rib fractures  when compared with 04/23/2011.  IMPRESSION:  1.  Enlarging right lower lobe nodule, worrisome for primary bronchogenic carcinoma. 2.  Radiation fibrosis and volume loss in the left hemithorax, stable. 3.  Additional left lateral rib fractures in the interval from 04/23/2011.  Original Report Authenticated By: Reyes Ivan, M.D.    ASSESSMENT: This is a very pleasant 61 years old white female with unresectable stage IIb non-small cell lung cancer status post concurrent chemoradiation followed by consolidation chemotherapy and she has been observation since December of 2011. Her CT scan of the chest that in general very stable except for the enlarging right lower lobe nodule. I discussed the scan results and showed the images to the patient and her husband.  PLAN: I recommended for her to have a PET scan performed for further evaluation of this nodule. I would see her back for followup visit in 2 weeks for evaluation and discussion of her treatment options based on the PET scan results.  All questions were answered. The patient knows to call the clinic with any problems, questions or concerns. We can certainly see the patient much sooner if necessary.

## 2011-08-26 NOTE — Telephone Encounter (Signed)
Gv pt appt for [id number].  schedule pet scan for 03/12 @ [facility]

## 2011-09-08 ENCOUNTER — Encounter (HOSPITAL_COMMUNITY)
Admission: RE | Admit: 2011-09-08 | Discharge: 2011-09-08 | Disposition: A | Discharge status: Home / Self Care | Payer: Medicare HMO | Source: Ambulatory Visit | Attending: Internal Medicine | Admitting: Internal Medicine

## 2011-09-08 ENCOUNTER — Other Ambulatory Visit: Payer: Self-pay | Admitting: Internal Medicine

## 2011-09-08 DIAGNOSIS — C3490 Malignant neoplasm of unspecified part of unspecified bronchus or lung: Secondary | ICD-10-CM

## 2011-09-08 DIAGNOSIS — R222 Localized swelling, mass and lump, trunk: Secondary | ICD-10-CM | POA: Insufficient documentation

## 2011-09-08 MED ORDER — FLUDEOXYGLUCOSE F - 18 (FDG) INJECTION
18.5000 | Freq: Once | INTRAVENOUS | Status: AC | PRN
  Administered 2011-09-08: 18.5 via INTRAVENOUS

## 2011-09-10 ENCOUNTER — Ambulatory Visit (HOSPITAL_BASED_OUTPATIENT_CLINIC_OR_DEPARTMENT_OTHER): Payer: Medicare HMO | Admitting: Internal Medicine

## 2011-09-10 ENCOUNTER — Telehealth: Payer: Self-pay | Admitting: Internal Medicine

## 2011-09-10 VITALS — BP 145/78 | HR 78 | Temp 97.8°F | Ht 64.0 in | Wt 157.3 lb

## 2011-09-10 DIAGNOSIS — C3490 Malignant neoplasm of unspecified part of unspecified bronchus or lung: Secondary | ICD-10-CM

## 2011-09-10 NOTE — Telephone Encounter (Signed)
appt made for two weeks and called [facility] and they will call the pt with her bx appt     aom

## 2011-09-10 NOTE — Progress Notes (Signed)
[facility] [phone]   [phone]  OFFICE PROGRESS NOTE  Waymon Budge, MD, MD [address] [employer] [place] [address]  PRINCIPAL DIAGNOSIS: Unresectable stage IIB (T3 N0 M0) non-small cell lung cancer, adenocarcinoma diagnosed in June 2011.   PRIOR THERAPY:  1. Status post concurrent chemoradiation with weekly carboplatin and paclitaxel. Last dose was given February 24, 2010. 2. Consolidation chemotherapy with carboplatin and Alimta, status post 3 cycles. Last dose was given June 09, 2010.  CURRENT THERAPY: Observation.   INTERVAL HISTORY: Maria Lucero 61 y.o. female returns to the clinic today for followup visit accompanied her husband. The patient continues to have the baseline shortness of breath and she is currently on home oxygen. She denied having any significant chest pain. No weight loss or night sweats. She was seen recently and CT scan of the chest showed questionable nodule in the right lung. I ordered a PET scan which was performed recently and the patient is here today for evaluation and discussion of her scan results.  MEDICAL HISTORY: Past Medical History  Diagnosis Date  . lung ca dx'd 11/2009    chemo comp 05/2010  . Hypertension   . Migraine headache   . Anxiety   . Urticaria   . Low back pain syndrome   . Hyperlipidemia   . Hypercholesterolemia   . COPD (chronic obstructive pulmonary disease)   . Depression   . Tobacco abuse   . Myalgia and myositis, unspecified     ALLERGIES:  is allergic to avinza; methocarbamol; aspirin; clarithromycin; codeine; and morphine.  MEDICATIONS:  Current Outpatient Prescriptions  Medication Sig Dispense Refill  . albuterol (VENTOLIN HFA) 108 (90 BASE) MCG/ACT inhaler Inhale 2 puffs into the lungs every 6 (six) hours as needed.        . benzonatate (TESSALON) 200 MG capsule Take 200 mg by mouth 3 (three) times daily as needed.        Marland Kitchen  buPROPion (WELLBUTRIN SR) 150 MG 12 hr tablet Take 150 mg by mouth 2 (two) times daily.        . clonazePAM (KLONOPIN) 1 MG tablet Take 1 mg by mouth. Take 1 to 2 tabs by mouth 3 to 4 times daily as needed       . divalproex (DEPAKOTE) 500 MG EC tablet Take 500 mg by mouth. 3 tabs by mouth at bedtime        . DULoxetine (CYMBALTA) 60 MG capsule Take 3 tabs by mouth daily       . estradiol (VIVELLE-DOT) 0.025 MG/24HR Place 1 patch onto the skin once a week.      . folic acid (FOLVITE) 400 MCG tablet Take 400 mcg by mouth daily.        Marland Kitchen HYDROcodone-homatropine (HYCODAN) 5-1.5 MG/5ML syrup Take by mouth every 6 (six) hours as needed.        Marland Kitchen lisinopril-hydrochlorothiazide (PRINZIDE,ZESTORETIC) 10-12.5 MG per tablet Take 1 tablet by mouth daily.        Marland Kitchen omeprazole (PRILOSEC) 20 MG capsule Take 20 mg by mouth daily.        Marland Kitchen oxyCODONE-acetaminophen (PERCOCET) 10-325 MG per tablet Take 1 tablet by mouth every 4 (four) hours as needed.        . predniSONE (STERAPRED UNI-PAK) 10 MG tablet Take by mouth daily. Three in the morning      . QUEtiapine (SEROQUEL) 25 MG tablet Take 25 mg by mouth  at bedtime.        . SUMAtriptan (IMITREX) 100 MG tablet Take 100 mg by mouth every 2 (two) hours as needed.        . temazepam (RESTORIL) 30 MG capsule Take 30 mg by mouth at bedtime as needed.          SURGICAL HISTORY:  Past Surgical History  Procedure Date  . Back surgery     x 4 L3-S1 1996,2000, 2002  . Vesicovaginal fistula closure w/ tah   . Appendectomy   . Bronchoscopy 2011  . Lung biopsy 2011    needle    REVIEW OF SYSTEMS:  A comprehensive review of systems was negative except for: Respiratory: positive for dyspnea on exertion   PHYSICAL EXAMINATION: General appearance: alert, cooperative and no distress Neck: no adenopathy Resp: clear to auscultation bilaterally Cardio: regular rate and rhythm, S1, S2 normal, no murmur, click, rub or gallop GI: soft, non-tender; bowel sounds normal; no  masses,  no organomegaly Extremities: extremities normal, atraumatic, no cyanosis or edema  ECOG PERFORMANCE STATUS: 1 - Symptomatic but completely ambulatory  Blood pressure 145/78, pulse 78, temperature 97.8 F (36.6 C), temperature source Oral, height 5\' 4"  (1.626 m), weight 157 lb 4.8 oz (71.351 kg).  LABORATORY DATA: Lab Results  Component Value Date   WBC 6.7 08/24/2011   HGB 11.5* 08/24/2011   HCT 34.1* 08/24/2011   MCV 97.1 08/24/2011   PLT 343 08/24/2011      Chemistry      Component Value Date/Time   NA 145 08/24/2011 1430   NA 140 07/02/2010 0517   K 4.3 08/24/2011 1430   K 3.9 DELTA CHECK NOTED 07/02/2010 0517   CL 95* 08/24/2011 1430   CL 103 07/02/2010 0517   CO2 31 08/24/2011 1430   CO2 28 07/02/2010 0517   BUN 15 08/24/2011 1430   BUN 10 07/02/2010 0517   CREATININE 0.8 08/24/2011 1430   CREATININE 0.70 07/02/2010 0517      Component Value Date/Time   CALCIUM 8.6 08/24/2011 1430   CALCIUM 8.9 07/02/2010 0517   ALKPHOS 71 08/24/2011 1430   ALKPHOS 98 06/28/2010 0600   AST 15 08/24/2011 1430   AST 14 06/28/2010 0600   ALT 10 06/28/2010 0600   BILITOT 0.50 08/24/2011 1430   BILITOT 0.4 06/28/2010 0600       RADIOGRAPHIC STUDIES: Ct Chest W Contrast  08/24/2011  *RADIOLOGY REPORT*  Clinical Data: Lung cancer with chest pain, cough and shortness of breath.  CT CHEST WITH CONTRAST  Technique:  Multidetector CT imaging of the chest was performed following the standard protocol during bolus administration of intravenous contrast.  Contrast: OMNIPAQUE IOHEXOL 300 MG/ML IV SOLN  Comparison: 04/23/2011.  Findings: No pathologically enlarged mediastinal, right hilar or axillary lymph nodes.  Heart size normal.  Anterior pericardial thickening or small pericardial effusion, stable.  Severe centrilobular emphysema.  Radiation fibrosis, consolidation and volume loss in the perihilar left upper and  left lower lobes, stable. Right lower lobe nodule has increased in prominence, now  measuring 6 mm (previously 3-4 mm when remeasured).  2 mm anterior right upper lobe nodule (image 28) is unchanged. Focal ground-glass in the lateral right middle lobe (image 40) is stable and nonspecific.  No pleural fluid.  Airway is otherwise unremarkable.  Incidental imaging of the upper abdomen shows a small juxta diaphragmatic lymph node, stable.  Probable incompletely imaged focal fat in segment IV of the liver.  Postoperative changes are seen  in the spine.  No worrisome lytic or sclerotic lesions.  There are additional left lateral rib fractures when compared with 04/23/2011.  IMPRESSION:  1.  Enlarging right lower lobe nodule, worrisome for primary bronchogenic carcinoma. 2.  Radiation fibrosis and volume loss in the left hemithorax, stable. 3.  Additional left lateral rib fractures in the interval from 04/23/2011.  Original Report Authenticated By: Reyes Ivan, M.D.   Nm Pet Image Restag (ps) Skull Base To Thigh  09/08/2011  *RADIOLOGY REPORT*  Clinical Data: Subsequent treatment strategy for lung cancer.  The patient presented with unresectable stage II B, T3 non-small cell lung cancer in the left lung.  Patient status post chemoradiation therapy.  Enlarging right pulmonary nodule.  NUCLEAR MEDICINE PET SKULL BASE TO THIGH  Fasting Blood Glucose:   82  Technique:  18.9 mCi F-18 FDG was injected intravenously.   CT data was obtained and used for attenuation correction and anatomic localization only.  (This was not acquired as a diagnostic CT examination.) Additional exam technical data entered  on technologist worksheet.  Comparison: CT 08/24/2011, PET CT 12/31/2009, CT 04/23/2011  Findings:  There is Neck: No hypermetabolic nodes in the neck.  Chest: There is dense consolidation with air bronchograms within the left lower lobe and the left upper lobe consistent with radiation change.  This is mildly hypermetabolic with SUV max = 4.4 consistent with inflammation.  Within the right lower lobe, the  enlarging pulmonary nodule which measures 7 mm (image 95) has mild metabolic activity with SUV max = 2.7. There this nodule is increased in size over the last two CT scans.  Abdomen / Pelvis:No abnormal hypermetabolic activity within the solid organs.  No evidence of abdominal or pelvic hypermetabolic nodes.  Skeleton:No focal hypermetabolic activity to suggest skeletal metastasis.  IMPRESSION:  1.  Enlarging pulmonary nodule at the right lung base with mild metabolic activity is concerning for metastasis.  Recommend percutaneous biopsy. 2.  Consolidation with mild metabolic activity within the left lower lobe most consistent with radiation change. 3.  No evidence of mediastinal metastasis or distant metastasis.  Original Report Authenticated By: Genevive Bi, M.D.    ASSESSMENT: This is a very pleasant 61 years old white female with history of unresectable stage IIb non-small cell lung cancer, status post concurrent chemoradiation followed by consolidation chemotherapy and now presenting with new or enlarging pulmonary nodule in the right lung base.  PLAN: I discussed the scan results with the patient and her husband. I recommended for her consideration of CT-guided biopsy of the right lung based nodule for confirmation of tissue diagnosis before discussion of treatment options.  I would see the patient back for followup visit in 2 weeks for evaluation and discussion of her biopsy results and recommendation regarding treatment of her condition.  All questions were answered. The patient knows to call the clinic with any problems, questions or concerns. We can certainly see the patient much sooner if necessary.

## 2011-09-17 ENCOUNTER — Encounter (HOSPITAL_COMMUNITY): Payer: Self-pay | Admitting: Pharmacy Technician

## 2011-09-18 ENCOUNTER — Other Ambulatory Visit: Payer: Self-pay | Admitting: Radiology

## 2011-09-22 ENCOUNTER — Ambulatory Visit (HOSPITAL_COMMUNITY)
Admission: RE | Admit: 2011-09-22 | Discharge: 2011-09-22 | Disposition: A | Discharge status: Home / Self Care | Payer: Medicare HMO | Source: Ambulatory Visit | Attending: Internal Medicine | Admitting: Internal Medicine

## 2011-09-22 ENCOUNTER — Ambulatory Visit (HOSPITAL_COMMUNITY)
Admission: RE | Admit: 2011-09-22 | Discharge: 2011-09-22 | Disposition: A | Discharge status: Home / Self Care | Payer: Medicare HMO | Source: Ambulatory Visit | Attending: Interventional Radiology | Admitting: Interventional Radiology

## 2011-09-22 ENCOUNTER — Encounter (HOSPITAL_COMMUNITY): Payer: Self-pay

## 2011-09-22 DIAGNOSIS — R911 Solitary pulmonary nodule: Secondary | ICD-10-CM | POA: Insufficient documentation

## 2011-09-22 DIAGNOSIS — I10 Essential (primary) hypertension: Secondary | ICD-10-CM | POA: Insufficient documentation

## 2011-09-22 DIAGNOSIS — R222 Localized swelling, mass and lump, trunk: Secondary | ICD-10-CM | POA: Insufficient documentation

## 2011-09-22 DIAGNOSIS — Z79899 Other long term (current) drug therapy: Secondary | ICD-10-CM | POA: Insufficient documentation

## 2011-09-22 DIAGNOSIS — IMO0002 Reserved for concepts with insufficient information to code with codable children: Secondary | ICD-10-CM | POA: Insufficient documentation

## 2011-09-22 DIAGNOSIS — E7800 Pure hypercholesterolemia, unspecified: Secondary | ICD-10-CM | POA: Insufficient documentation

## 2011-09-22 DIAGNOSIS — C3490 Malignant neoplasm of unspecified part of unspecified bronchus or lung: Secondary | ICD-10-CM

## 2011-09-22 DIAGNOSIS — E785 Hyperlipidemia, unspecified: Secondary | ICD-10-CM | POA: Insufficient documentation

## 2011-09-22 DIAGNOSIS — Y849 Medical procedure, unspecified as the cause of abnormal reaction of the patient, or of later complication, without mention of misadventure at the time of the procedure: Secondary | ICD-10-CM | POA: Insufficient documentation

## 2011-09-22 DIAGNOSIS — J449 Chronic obstructive pulmonary disease, unspecified: Secondary | ICD-10-CM | POA: Insufficient documentation

## 2011-09-22 DIAGNOSIS — J4489 Other specified chronic obstructive pulmonary disease: Secondary | ICD-10-CM | POA: Insufficient documentation

## 2011-09-22 LAB — CBC
HCT: 34.9 % — ABNORMAL LOW (ref 36.0–46.0)
Hemoglobin: 11.4 g/dL — ABNORMAL LOW (ref 12.0–15.0)
MCV: 99.1 fL (ref 78.0–100.0)
RBC: 3.52 MIL/uL — ABNORMAL LOW (ref 3.87–5.11)
WBC: 6.4 10*3/uL (ref 4.0–10.5)

## 2011-09-22 MED ORDER — MIDAZOLAM HCL 2 MG/2ML IJ SOLN
INTRAMUSCULAR | Status: AC
  Filled 2011-09-22: qty 6

## 2011-09-22 MED ORDER — HEPARIN SOD (PORK) LOCK FLUSH 100 UNIT/ML IV SOLN
INTRAVENOUS | Status: AC
  Filled 2011-09-22: qty 5

## 2011-09-22 MED ORDER — SODIUM CHLORIDE 0.9 % IV SOLN: Freq: Once | INTRAVENOUS | Status: DC

## 2011-09-22 MED ORDER — FENTANYL CITRATE 0.05 MG/ML IJ SOLN
INTRAMUSCULAR | Status: AC
  Filled 2011-09-22: qty 6

## 2011-09-22 MED ORDER — MIDAZOLAM HCL 5 MG/5ML IJ SOLN
INTRAMUSCULAR | Status: AC | PRN
  Administered 2011-09-22 (×2): 0.5 mg via INTRAVENOUS

## 2011-09-22 MED ORDER — FENTANYL CITRATE 0.05 MG/ML IJ SOLN
INTRAMUSCULAR | Status: AC | PRN
  Administered 2011-09-22 (×2): 25 ug via INTRAVENOUS

## 2011-09-22 MED ORDER — HEPARIN SOD (PORK) LOCK FLUSH 100 UNIT/ML IV SOLN: 500.0000 [IU] | INTRAVENOUS | Status: DC | PRN

## 2011-09-22 NOTE — H&P (Signed)
Maria Lucero is an 61 y.o. female.   Chief Complaint: right lung mass HPI: Patient with history of left lung carcinoma and recent CT/PET scan revealing hypermetabolic right lower lobe lung nodule; she presents today for CT guided biopsy of right lung nodule.  Past Medical History  Diagnosis Date  . lung ca dx'd 11/2009    chemo comp 05/2010  . Hypertension   . Migraine headache   . Anxiety   . Urticaria   . Low back pain syndrome   . Hyperlipidemia   . Hypercholesterolemia   . COPD (chronic obstructive pulmonary disease)   . Depression   . Tobacco abuse   . Myalgia and myositis, unspecified     Past Surgical History  Procedure Date  . Back surgery     x 4 L3-S1 1996,2000, 2002  . Vesicovaginal fistula closure w/ tah   . Appendectomy   . Bronchoscopy 2011  . Lung biopsy 2011    needle    Family History  Problem Relation Age of Onset  . Heart disease      paternal side  . Rheum arthritis Mother   . Lung cancer    . Colon cancer    . Brain cancer    . Kidney cancer Maternal Grandmother    Social History:  reports that she has been smoking.  She does not have any smokeless tobacco history on file. Her alcohol and drug histories not on file.  Allergies:  Allergies  Allergen Reactions  . Avinza Other (See Comments)    confusion  . Methocarbamol Nausea And Vomiting  . Aspirin Nausea And Vomiting  . Clarithromycin Other (See Comments)    Stomach problems   . Codeine Nausea And Vomiting  . Morphine Other (See Comments)    Passed out    Medications Prior to Admission  Medication Sig Dispense Refill  . albuterol (VENTOLIN HFA) 108 (90 BASE) MCG/ACT inhaler Inhale 2 puffs into the lungs every 6 (six) hours as needed. Wheezing       . benzonatate (TESSALON) 200 MG capsule Take 200 mg by mouth 3 (three) times daily as needed. Cough       . buPROPion (WELLBUTRIN SR) 150 MG 12 hr tablet Take 150 mg by mouth 2 (two) times daily.        . clonazePAM (KLONOPIN) 1 MG  tablet Take 1-2 mg by mouth 3 (three) times daily.       . divalproex (DEPAKOTE) 500 MG EC tablet Take 1,500 mg by mouth at bedtime.       . DULoxetine (CYMBALTA) 60 MG capsule Take 60 mg by mouth daily before breakfast.       . estradiol (VIVELLE-DOT) 0.025 MG/24HR Place 1 patch onto the skin once a week. Tuesday       . folic acid (FOLVITE) 400 MCG tablet Take 400 mcg by mouth daily.        Marland Kitchen HYDROcodone-homatropine (HYCODAN) 5-1.5 MG/5ML syrup Take 5 mLs by mouth every 6 (six) hours as needed. Cough       . ketorolac (ACULAR LS) 0.4 % SOLN Place 1 drop into the right eye 3 (three) times daily.      Marland Kitchen omeprazole (PRILOSEC) 20 MG capsule Take 20 mg by mouth daily.        Marland Kitchen oxyCODONE-acetaminophen (PERCOCET) 10-325 MG per tablet Take 1 tablet by mouth every 4 (four) hours as needed. Pain       . prednisoLONE acetate (PRED FORTE) 1 % ophthalmic  suspension Place 1 drop into the right eye daily.      . predniSONE (STERAPRED UNI-PAK) 10 MG tablet Take 30 mg by mouth daily after breakfast.       . QUEtiapine (SEROQUEL) 25 MG tablet Take 25 mg by mouth at bedtime.        . SUMAtriptan (IMITREX) 100 MG tablet Take 100 mg by mouth every 2 (two) hours as needed. Headache       . temazepam (RESTORIL) 30 MG capsule Take 30 mg by mouth at bedtime as needed. Sleep        Medications Prior to Admission  Medication Dose Route Frequency Provider Last Rate Last Dose  . 0.9 %  sodium chloride infusion   Intravenous Once Brayton El, PA        Results for orders placed during the hospital encounter of 09/22/11 (from the past 48 hour(s))  CBC     Status: Abnormal   Collection Time   09/22/11  8:30 AM      Component Value Range Comment   WBC 6.4  4.0 - 10.5 (K/uL)    RBC 3.52 (*) 3.87 - 5.11 (MIL/uL)    Hemoglobin 11.4 (*) 12.0 - 15.0 (g/dL)    HCT 91.4 (*) 78.2 - 46.0 (%)    MCV 99.1  78.0 - 100.0 (fL)    MCH 32.4  26.0 - 34.0 (pg)    MCHC 32.7  30.0 - 36.0 (g/dL)    RDW 95.6  21.3 - 08.6 (%)     Platelets 310  150 - 400 (K/uL)    Results for orders placed during the hospital encounter of 09/22/11  APTT      Component Value Range   aPTT 38 (*) 24 - 37 (seconds)  CBC      Component Value Range   WBC 6.4  4.0 - 10.5 (K/uL)   RBC 3.52 (*) 3.87 - 5.11 (MIL/uL)   Hemoglobin 11.4 (*) 12.0 - 15.0 (g/dL)   HCT 57.8 (*) 46.9 - 46.0 (%)   MCV 99.1  78.0 - 100.0 (fL)   MCH 32.4  26.0 - 34.0 (pg)   MCHC 32.7  30.0 - 36.0 (g/dL)   RDW 62.9  52.8 - 41.3 (%)   Platelets 310  150 - 400 (K/uL)  PROTIME-INR      Component Value Range   Prothrombin Time 12.1  11.6 - 15.2 (seconds)   INR 0.88  0.00 - 1.49     Review of Systems  Constitutional: Negative for fever and chills.  Respiratory: Positive for cough and shortness of breath.   Cardiovascular: Positive for chest pain.  Gastrointestinal: Positive for nausea. Negative for vomiting and abdominal pain.  Musculoskeletal: Positive for back pain.  Neurological: Positive for headaches.  Endo/Heme/Allergies: Does not bruise/bleed easily.    Blood pressure 145/72, pulse 91, temperature 97.4 F (36.3 C), temperature source Oral, resp. rate 20, height 5\' 4"  (1.626 m), weight 159 lb (72.122 kg), SpO2 98.00%. Physical Exam  Constitutional: She is oriented to person, place, and time. She appears well-developed and well-nourished.  Cardiovascular: Normal rate and regular rhythm.   Respiratory: Effort normal and breath sounds normal.  GI: Soft. Bowel sounds are normal.  Musculoskeletal: Normal range of motion.  Neurological: She is alert and oriented to person, place, and time.     Assessment/Plan Patient with left lung adenocarcinoma and recent CT/PET demonstrating hypermetabolic RLL lung nodule; plan is for CT guided biopsy of right lung nodule. Details/risks of procedure (including,  but not limited to, internal bleeding, infection, pneumothorax with possible chest tube placement) discussed with pt/husband with their understanding and consent.    Valborg Friar,D KEVIN 09/22/2011, 8:51 AM

## 2011-09-22 NOTE — Progress Notes (Signed)
Rt port a cath prepped per protocol c sterile technique. 19gx1in HPN inserted x 1  +flushes easily c 10cc ns +blood return  Blood draw completed

## 2011-09-22 NOTE — Progress Notes (Signed)
Denies prior problem c heparin. per protocol, PAC flushed c 10cc ns +5cc heparin flush.  HPN removed  Covered c sterile gauze and dsg   tol well

## 2011-09-22 NOTE — Discharge Instructions (Signed)
Lung Biopsy A Lung Biopsy involves taking a small piece of lung tissue so it can be examined under a microscope by a pathologist to help diagnose many different lung disorders. This test can be done in a variety of ways. One way is to open the chest during surgery and remove a piece of lung tissue. It can also be done through a bronchoscope (a pencil-sized telescope) which allows the caregiver to remove a piece of tissue through your trachea (the large breathing tube to your lungs). Another procedure is a needle biopsy of the lung, which involves inserting a needle into the lung and removing a small piece of tissue. POSSIBLE COMPLICATIONS INCLUDE:  Collapse of the lung   Bleeding   Infection  PREPARATION FOR TEST No preparation or fasting is necessary. NORMAL FINDINGS No evidence of pathology. Ranges for normal findings may vary among different laboratories and hospitals. You should always check with your doctor after having lab work or other tests done to discuss the meaning of your test results and whether your values are considered within normal limits. MEANING OF TEST   Your caregiver will go over the test results with you and discuss the importance and meaning of your results, as well as treatment options and the need for additional tests if necessary. OBTAINING THE TEST RESULTS It is your responsibility to obtain your test results. Ask the lab or department performing the test when and how you will get your results. Document Released: 09/03/2004 Document Revised: 06/04/2011 Document Reviewed: 05/26/2008 [employer] Patient Information 2012 [employer]. 

## 2011-09-22 NOTE — Procedures (Signed)
Technically successful CT guided biopsy of right lower lobe pulmonary nodule. Small amount of hemorrhage after the biopsy which did not increase in size on delayed scan.  No PTX. Awaiting pathology report.

## 2011-09-24 ENCOUNTER — Ambulatory Visit (HOSPITAL_COMMUNITY): Payer: Medicare HMO

## 2011-09-24 ENCOUNTER — Telehealth: Payer: Self-pay | Admitting: Internal Medicine

## 2011-09-24 ENCOUNTER — Other Ambulatory Visit (HOSPITAL_COMMUNITY): Payer: Medicare HMO

## 2011-09-24 ENCOUNTER — Ambulatory Visit (HOSPITAL_BASED_OUTPATIENT_CLINIC_OR_DEPARTMENT_OTHER): Payer: Medicare HMO | Admitting: Internal Medicine

## 2011-09-24 VITALS — BP 142/72 | HR 93 | Temp 96.9°F | Wt 158.3 lb

## 2011-09-24 DIAGNOSIS — C3490 Malignant neoplasm of unspecified part of unspecified bronchus or lung: Secondary | ICD-10-CM

## 2011-09-24 NOTE — Telephone Encounter (Signed)
Gv pt appt for [id number].  scheduled pt for ct scan on 06/25 @ [facility]

## 2011-09-24 NOTE — Progress Notes (Signed)
[facility] [phone]   [phone]  OFFICE PROGRESS NOTE  Waymon Budge, MD, MD [address] [employer] [place] [address]  PRINCIPAL DIAGNOSIS: Unresectable stage IIB (T3 N0 M0) non-small cell lung cancer, adenocarcinoma diagnosed in June 2011.   PRIOR THERAPY:  1. Status post concurrent chemoradiation with weekly carboplatin and paclitaxel. Last dose was given February 24, 2010. 2. Consolidation chemotherapy with carboplatin and Alimta, status post 3 cycles. Last dose was given June 09, 2010.  CURRENT THERAPY: Observation.   INTERVAL HISTORY: Maria Lucero 61 y.o. female returns to the clinic today for followup visit accompanied by her husband. The patient recently underwent CT-guided biopsy of the right lower lobe pulmonary nodule by interventional radiology. The final pathology showed benign pulmonary parenchyma with no atypia or malignancy present. The patient is here today for evaluation and discussion of her treatment options. She is feeling fine except for the baseline shortness of breath and she still on home oxygen. No significant cough or hemoptysis. No chest pain and no weight loss.  MEDICAL HISTORY: Past Medical History  Diagnosis Date  . lung ca dx'd 11/2009    chemo comp 05/2010  . Hypertension   . Migraine headache   . Anxiety   . Urticaria   . Low back pain syndrome   . Hyperlipidemia   . Hypercholesterolemia   . COPD (chronic obstructive pulmonary disease)   . Depression   . Tobacco abuse   . Myalgia and myositis, unspecified     ALLERGIES:  is allergic to avinza; methocarbamol; aspirin; clarithromycin; codeine; and morphine.  MEDICATIONS:  Current Outpatient Prescriptions  Medication Sig Dispense Refill  . albuterol (VENTOLIN HFA) 108 (90 BASE) MCG/ACT inhaler Inhale 2 puffs into the lungs every 6 (six) hours as needed. Wheezing       . benzonatate (TESSALON) 200 MG capsule  Take 200 mg by mouth 3 (three) times daily as needed. Cough       . buPROPion (WELLBUTRIN SR) 150 MG 12 hr tablet Take 150 mg by mouth 2 (two) times daily.        . clonazePAM (KLONOPIN) 1 MG tablet Take 1-2 mg by mouth 3 (three) times daily.       . divalproex (DEPAKOTE) 500 MG EC tablet Take 1,500 mg by mouth at bedtime.       . DULoxetine (CYMBALTA) 60 MG capsule Take 60 mg by mouth daily before breakfast.       . estradiol (VIVELLE-DOT) 0.025 MG/24HR Place 1 patch onto the skin once a week. Tuesday       . folic acid (FOLVITE) 400 MCG tablet Take 400 mcg by mouth daily.        Marland Kitchen HYDROcodone-homatropine (HYCODAN) 5-1.5 MG/5ML syrup Take 5 mLs by mouth every 6 (six) hours as needed. Cough       . ketorolac (ACULAR LS) 0.4 % SOLN Place 1 drop into the right eye 3 (three) times daily.      Marland Kitchen omeprazole (PRILOSEC) 20 MG capsule Take 20 mg by mouth daily.        Marland Kitchen oxyCODONE-acetaminophen (PERCOCET) 10-325 MG per tablet Take 1 tablet by mouth every 4 (four) hours as needed. Pain       . prednisoLONE acetate (PRED FORTE) 1 % ophthalmic suspension Place 1 drop into the right eye daily.      . predniSONE (STERAPRED UNI-PAK) 10 MG tablet Take 30 mg by mouth daily  after breakfast.       . QUEtiapine (SEROQUEL) 25 MG tablet Take 25 mg by mouth at bedtime.        . SUMAtriptan (IMITREX) 100 MG tablet Take 100 mg by mouth every 2 (two) hours as needed. Headache       . temazepam (RESTORIL) 30 MG capsule Take 30 mg by mouth at bedtime as needed. Sleep       . DISCONTD: lisinopril-hydrochlorothiazide (PRINZIDE,ZESTORETIC) 10-12.5 MG per tablet Take 1 tablet by mouth.         SURGICAL HISTORY:  Past Surgical History  Procedure Date  . Back surgery     x 4 L3-S1 1996,2000, 2002  . Vesicovaginal fistula closure w/ tah   . Appendectomy   . Bronchoscopy 2011  . Lung biopsy 2011    needle    REVIEW OF SYSTEMS:  A comprehensive review of systems was negative except for: Respiratory: positive for  dyspnea on exertion   PHYSICAL EXAMINATION: General appearance: alert, cooperative and no distress Neck: no adenopathy Lymph nodes: Cervical, supraclavicular, and axillary nodes normal. Resp: clear to auscultation bilaterally Cardio: regular rate and rhythm, S1, S2 normal, no murmur, click, rub or gallop GI: soft, non-tender; bowel sounds normal; no masses,  no organomegaly Extremities: extremities normal, atraumatic, no cyanosis or edema Neurologic: Alert and oriented X 3, normal strength and tone. Normal symmetric reflexes. Normal coordination and gait  ECOG PERFORMANCE STATUS: 1 - Symptomatic but completely ambulatory  Blood pressure 142/72, pulse 93, temperature 96.9 F (36.1 C), temperature source Oral, weight 158 lb 4.8 oz (71.804 kg).  LABORATORY DATA: Lab Results  Component Value Date   WBC 6.4 09/22/2011   HGB 11.4* 09/22/2011   HCT 34.9* 09/22/2011   MCV 99.1 09/22/2011   PLT 310 09/22/2011      Chemistry      Component Value Date/Time   NA 145 08/24/2011 1430   NA 140 07/02/2010 0517   K 4.3 08/24/2011 1430   K 3.9 DELTA CHECK NOTED 07/02/2010 0517   CL 95* 08/24/2011 1430   CL 103 07/02/2010 0517   CO2 31 08/24/2011 1430   CO2 28 07/02/2010 0517   BUN 15 08/24/2011 1430   BUN 10 07/02/2010 0517   CREATININE 0.8 08/24/2011 1430   CREATININE 0.70 07/02/2010 0517      Component Value Date/Time   CALCIUM 8.6 08/24/2011 1430   CALCIUM 8.9 07/02/2010 0517   ALKPHOS 71 08/24/2011 1430   ALKPHOS 98 06/28/2010 0600   AST 15 08/24/2011 1430   AST 14 06/28/2010 0600   ALT 10 06/28/2010 0600   BILITOT 0.50 08/24/2011 1430   BILITOT 0.4 06/28/2010 0600       RADIOGRAPHIC STUDIES: Dg Chest 1 View  09/22/2011  *RADIOLOGY REPORT*  Clinical Data: Post right lung biopsy, evaluate for pneumothorax  CHEST - 1 VIEW  Comparison: PET CT dated 09/08/2011  Findings: No pneumothorax is seen following right lung biopsy.  Mild patchy right lower lobe opacity, likely reflecting post biopsy hemorrhage.   Stable left upper lobe opacity with associated volume loss. No pleural effusion.  Right chest power port.  The heart is normal in size.  Irregularity of the left lateral chest wall, chronic. Thoracolumbar fixation.  IMPRESSION: No pneumothorax is seen.  Mild patchy right lower lobe opacity, likely reflecting post biopsy hemorrhage.  Stable left upper lobe opacity with associated volume loss and overlying left chest wall deformity.  Original Report Authenticated By: Charline Bills, M.D.   Nm  Pet Image Restag (ps) Skull Base To Thigh  09/08/2011  *RADIOLOGY REPORT*  Clinical Data: Subsequent treatment strategy for lung cancer.  The patient presented with unresectable stage II B, T3 non-small cell lung cancer in the left lung.  Patient status post chemoradiation therapy.  Enlarging right pulmonary nodule.  NUCLEAR MEDICINE PET SKULL BASE TO THIGH  Fasting Blood Glucose:   82  Technique:  18.9 mCi F-18 FDG was injected intravenously.   CT data was obtained and used for attenuation correction and anatomic localization only.  (This was not acquired as a diagnostic CT examination.) Additional exam technical data entered  on technologist worksheet.  Comparison: CT 08/24/2011, PET CT 12/31/2009, CT 04/23/2011  Findings:  There is Neck: No hypermetabolic nodes in the neck.  Chest: There is dense consolidation with air bronchograms within the left lower lobe and the left upper lobe consistent with radiation change.  This is mildly hypermetabolic with SUV max = 4.4 consistent with inflammation.  Within the right lower lobe, the enlarging pulmonary nodule which measures 7 mm (image 95) has mild metabolic activity with SUV max = 2.7. There this nodule is increased in size over the last two CT scans.  Abdomen / Pelvis:No abnormal hypermetabolic activity within the solid organs.  No evidence of abdominal or pelvic hypermetabolic nodes.  Skeleton:No focal hypermetabolic activity to suggest skeletal metastasis.  IMPRESSION:  1.   Enlarging pulmonary nodule at the right lung base with mild metabolic activity is concerning for metastasis.  Recommend percutaneous biopsy. 2.  Consolidation with mild metabolic activity within the left lower lobe most consistent with radiation change. 3.  No evidence of mediastinal metastasis or distant metastasis.  Original Report Authenticated By: Genevive Bi, M.D.   Ct Biopsy  09/22/2011  *RADIOLOGY REPORT*  Indication: Interval increase in the size of hypermetabolic right lower lobe pulmonary nodule.  CT GUIDED CORE NEEDLE BIOPSY OF RIGHT LOWER LOBE PULMONARY NODULE  Comparisons: PET CT - 09/08/2011; chest CT - 08/24/2011; 04/23/2011  Medications: Fentanyl 50 mcg IV; Versed 1 mg IV  Contrast: None  Sedation time: 20 minutes  Complications: Small amounts of peri biopsy hemorrhage was noted after the acquisition of a solitary core needle biopsy.  Delayed chest CT scan did not demonstrate increased size of the peri biopsy hemorrhage.  TECHNIQUE/FINDINGS:  Informed consent was obtained from the patient following an explanation of the procedure, risks, benefits and alternatives.  A time out was performed prior to the initiation of the procedure.  The patient was positioned right lateral decubitus on the CT table and a limited CT was performed for procedural planning demonstrating unchanged size of the approximately 6 mm x 6 mm nodule within the right lower lobe (image 25, series 2).  The procedure was planned.  The operative site was prepped and draped in the usual sterile fashion.   Appropriate trajectory was confirmed with a 22 gauge spinal needle after the adjacent tissues were anesthetized with 1% Lidocaine with epinephrine.   Under intermittent CT guidance, a 17 gauge coaxial needle was advanced into the peripheral aspect of the nodule.  Appropriate positioning was again confirmed and a single sample with an 18 gauge core biopsy device. A limited post biopsy CT demonstrated a small amount of peri biopsy  hemorrhage, obscuring the pulmonary nodule as such, the coaxial needle was removed.  A delayed CT scan was acquired, demonstrating grossly unchanged size of the peri biopsy hemorrhage. The patient remained hemodynamically stable with saturations greater than 95% throughout and did  not experience any hemoptysis. At this point, the procedure was terminated.  A dressing was placed.  The patient tolerated the procedure well without immediate postprocedural complication.  IMPRESSION:  Technically successful CT guided core needle biopsy of right lower lobe pulmonary nodule.  Note the procedure was complicated by a minimal amount of non enlarging peri biopsy hemorrhage following the acquisition of the solitary core needle biopsy.  Original Report Authenticated By: Waynard Reeds, M.D.    ASSESSMENT: This is a very pleasant 61 years old white female with unresectable stage IIB non-small cell lung cancer status post concurrent chemoradiation followed by consolidation chemotherapy. The patient had a suspicious right lower lobe nodule that was recently biopsied and showed benign pulmonary parenchyma.  PLAN: I discussed the biopsy results with the patient and her husband and recommend for her continuous observation with repeat CT scan of the chest in 3 months for further evaluation of this nodule which is still suspicious. She was advised to call me immediately if she has any concerning symptoms in the interval. All questions were answered. The patient knows to call the clinic with any problems, questions or concerns. We can certainly see the patient much sooner if necessary.

## 2011-11-26 ENCOUNTER — Encounter: Payer: Self-pay | Admitting: Internal Medicine

## 2011-11-26 ENCOUNTER — Telehealth: Payer: Self-pay | Admitting: Pulmonary Disease

## 2011-11-26 ENCOUNTER — Ambulatory Visit (INDEPENDENT_AMBULATORY_CARE_PROVIDER_SITE_OTHER): Payer: Medicare HMO | Admitting: Internal Medicine

## 2011-11-26 VITALS — BP 136/84 | HR 98 | Ht 64.0 in | Wt 168.6 lb

## 2011-11-26 DIAGNOSIS — J449 Chronic obstructive pulmonary disease, unspecified: Secondary | ICD-10-CM

## 2011-11-26 DIAGNOSIS — J4489 Other specified chronic obstructive pulmonary disease: Secondary | ICD-10-CM

## 2011-11-26 DIAGNOSIS — C3490 Malignant neoplasm of unspecified part of unspecified bronchus or lung: Secondary | ICD-10-CM

## 2011-11-26 MED ORDER — ALBUTEROL SULFATE (2.5 MG/3ML) 0.083% IN NEBU: 2.5000 mg | INHALATION_SOLUTION | Freq: Three times a day (TID) | RESPIRATORY_TRACT | Status: DC | PRN

## 2011-11-26 NOTE — Patient Instructions (Signed)
Order- [facility]- needs counseling re home nebulizer medication coverage through [facility]                       - Will [facility] recognize a "hardship" situation in terms of services to her? Do they need documentation from me?                       - information on Pulmonary Rehab   Please call us with the name of your nebulizer medicine.

## 2011-11-26 NOTE — Progress Notes (Signed)
11/26/11- 61 yoF former smoker followed for COPD, history of adeno CA left lung/ chemo/ XRT LOV 08/29/10....husband here She follows with Dr. Mohamed/[facility] L/[facility]. Nebulizer used once or twice daily. Stays hoarse "most of the time". Episodic, cough and chest congestion. Last week her primary physician gave antibiotic and she is taking prednisone 1/2x5 mg tab, daily. She asks a letter for her DME about her hardship, hoping they can help her with nebulizer charges. PET 09/08/11- discussed with her IMPRESSION:  1. Enlarging pulmonary nodule at the right lung base with mild  metabolic activity is concerning for metastasis. Recommend  percutaneous biopsy.  2. Consolidation with mild metabolic activity within the left  lower lobe most consistent with radiation change.  3. No evidence of mediastinal metastasis or distant metastasis.  Original Report Authenticated By: Genevive Bi, M.D.   ROS-see HPI Constitutional:   No-   weight loss, night sweats, fevers, chills, fatigue, lassitude. HEENT:   No-  headaches, difficulty swallowing, tooth/dental problems, sore throat,       No-  sneezing, itching, ear ache, nasal congestion, post nasal drip,  CV:  No-   chest pain, orthopnea, PND, swelling in lower extremities, anasarca, dizziness, palpitations Resp: + shortness of breath with exertion or at rest.              + productive cough,  + non-productive cough,  No- coughing up of blood.              No-   change in color of mucus.  No- wheezing.   Skin: No-   rash or lesions. GI:  No-   heartburn, indigestion, abdominal pain, nausea, vomiting,  GU: . MS:  No-   joint pain or swelling.  . Neuro-     nothing unusual Psych:  No- change in mood or affect. + depression or anxiety.  No memory loss.  OBJ- Physical Exam General- Alert, Oriented, Affect-appropriate, Distress- none acute Skin- rash-none, lesions- none, excoriation- none Lymphadenopathy- none Head- atraumatic           Eyes- Gross vision intact, PERRLA, conjunctivae and secretions clear            Ears- Hearing, canals-normal            Nose- Clear, no-Septal dev, mucus, polyps, erosion, perforation             Throat- Mallampati II , mucosa clear , drainage- none, tonsils- atrophic Neck- flexible , trachea midline, no stridor , thyroid nl, carotid no bruit Chest - symmetrical excursion , unlabored           Heart/CV- RRR , no murmur , no gallop  , no rub, nl s1 s2                           - JVD- none , edema- none, stasis changes- none, varices- none           Lung- clear to P&A, wheeze- none, cough+ dry , dullness-none, rub- none           Chest wall-  Abd- t Br/ Gen/ Rectal- Not done, not indicated Extrem- cyanosis- none, clubbing, none, atrophy- none, strength- nl Neuro- grossly intact to observation

## 2011-11-26 NOTE — Telephone Encounter (Signed)
Spoke with pt and her med list has been updated and rx for the albuterol has been printed and signed by CY.  Rx has been faxed to [facility] for the pt.  Pt is aware.

## 2011-11-30 NOTE — Assessment & Plan Note (Addendum)
Her COPD assessment test (CAT) score today is 28 reflecting significant symptom impact. Bronchitic exacerbation with cough Plan discussed recent antibiotic therapy and ongoing prednisone therapy as well as her nebulizer medication and finances.

## 2011-11-30 NOTE — Assessment & Plan Note (Signed)
Right lung nodule positive on PET scan consistent with recurrence/metastasis. She will continue to follow with Dr. Arbutus Ped.

## 2011-12-16 ENCOUNTER — Telehealth: Payer: Self-pay | Admitting: Internal Medicine

## 2011-12-16 NOTE — Telephone Encounter (Signed)
l./m  that appt was moved to 7/8  aom

## 2011-12-21 ENCOUNTER — Telehealth: Payer: Self-pay | Admitting: Internal Medicine

## 2011-12-21 NOTE — Telephone Encounter (Signed)
called as they r/s ct to 7/5,lab was also moved,pt had not recd vm as they are out of town but sch is good     aom

## 2011-12-22 ENCOUNTER — Other Ambulatory Visit (HOSPITAL_COMMUNITY): Payer: Medicare HMO

## 2011-12-22 ENCOUNTER — Other Ambulatory Visit: Payer: Medicare HMO | Admitting: Lab

## 2011-12-24 ENCOUNTER — Ambulatory Visit: Payer: Medicare HMO | Admitting: Internal Medicine

## 2011-12-30 ENCOUNTER — Telehealth: Payer: Self-pay | Admitting: *Deleted

## 2011-12-30 NOTE — Telephone Encounter (Signed)
Patient called and moved her Ct scan appt to the following week. Per the patient, she is out of town. I have called radiology and moved her scan appt to the following week. All other appts moved to follow scan.  Patient is aware.  JMW

## 2012-01-01 ENCOUNTER — Other Ambulatory Visit (HOSPITAL_COMMUNITY): Payer: Medicare HMO

## 2012-01-01 ENCOUNTER — Other Ambulatory Visit: Payer: Medicare HMO

## 2012-01-04 ENCOUNTER — Telehealth: Payer: Self-pay | Admitting: Medical Oncology

## 2012-01-04 ENCOUNTER — Ambulatory Visit: Payer: Medicare HMO | Admitting: Internal Medicine

## 2012-01-04 NOTE — Telephone Encounter (Signed)
Ct r/s to July 10th

## 2012-01-04 NOTE — Telephone Encounter (Signed)
Pt called to cancel appointment and will call back to reschedule

## 2012-01-04 NOTE — Telephone Encounter (Signed)
Left message for pt to call.

## 2012-01-06 ENCOUNTER — Other Ambulatory Visit: Payer: Medicare HMO | Admitting: Lab

## 2012-01-06 ENCOUNTER — Other Ambulatory Visit (HOSPITAL_COMMUNITY): Payer: Medicare HMO

## 2012-01-07 ENCOUNTER — Ambulatory Visit: Payer: Medicare HMO | Admitting: Internal Medicine

## 2012-01-12 ENCOUNTER — Other Ambulatory Visit: Payer: Self-pay | Admitting: *Deleted

## 2012-02-04 ENCOUNTER — Telehealth: Payer: Self-pay | Admitting: Internal Medicine

## 2012-02-04 NOTE — Telephone Encounter (Signed)
pt had called and l/m to r/s appts,l/m for pt to c/b     aom

## 2012-02-05 ENCOUNTER — Other Ambulatory Visit: Payer: Self-pay | Admitting: *Deleted

## 2012-02-05 DIAGNOSIS — C3490 Malignant neoplasm of unspecified part of unspecified bronchus or lung: Secondary | ICD-10-CM

## 2012-02-15 ENCOUNTER — Telehealth: Payer: Self-pay | Admitting: Internal Medicine

## 2012-02-15 NOTE — Telephone Encounter (Signed)
S/w the pt and she is aware of her sept labct scan and md appts

## 2012-02-17 ENCOUNTER — Other Ambulatory Visit: Payer: Medicare HMO | Admitting: Lab

## 2012-02-17 ENCOUNTER — Other Ambulatory Visit (HOSPITAL_COMMUNITY): Payer: Medicare HMO

## 2012-02-23 ENCOUNTER — Ambulatory Visit: Payer: Medicare HMO | Admitting: Internal Medicine

## 2012-03-10 ENCOUNTER — Telehealth: Payer: Self-pay | Admitting: Internal Medicine

## 2012-03-10 ENCOUNTER — Other Ambulatory Visit: Payer: Medicare HMO | Admitting: Lab

## 2012-03-10 ENCOUNTER — Other Ambulatory Visit: Payer: Self-pay | Admitting: Medical Oncology

## 2012-03-10 DIAGNOSIS — C3490 Malignant neoplasm of unspecified part of unspecified bronchus or lung: Secondary | ICD-10-CM

## 2012-03-10 NOTE — Progress Notes (Signed)
Orders entered for ct scan

## 2012-03-10 NOTE — Telephone Encounter (Signed)
lmonvm adviisng the pt of her r/s lab appt on 03/17/2012

## 2012-03-17 ENCOUNTER — Other Ambulatory Visit (HOSPITAL_BASED_OUTPATIENT_CLINIC_OR_DEPARTMENT_OTHER): Payer: Medicare HMO | Admitting: Lab

## 2012-03-17 ENCOUNTER — Ambulatory Visit (HOSPITAL_COMMUNITY)
Admission: RE | Admit: 2012-03-17 | Discharge: 2012-03-17 | Disposition: A | Discharge status: Home / Self Care | Payer: Medicare HMO | Source: Ambulatory Visit | Attending: Internal Medicine | Admitting: Internal Medicine

## 2012-03-17 ENCOUNTER — Other Ambulatory Visit: Payer: Medicare HMO | Admitting: Lab

## 2012-03-17 DIAGNOSIS — I319 Disease of pericardium, unspecified: Secondary | ICD-10-CM | POA: Insufficient documentation

## 2012-03-17 DIAGNOSIS — Z923 Personal history of irradiation: Secondary | ICD-10-CM | POA: Insufficient documentation

## 2012-03-17 DIAGNOSIS — Z9221 Personal history of antineoplastic chemotherapy: Secondary | ICD-10-CM | POA: Insufficient documentation

## 2012-03-17 DIAGNOSIS — C3490 Malignant neoplasm of unspecified part of unspecified bronchus or lung: Secondary | ICD-10-CM | POA: Insufficient documentation

## 2012-03-17 DIAGNOSIS — R911 Solitary pulmonary nodule: Secondary | ICD-10-CM | POA: Insufficient documentation

## 2012-03-17 LAB — COMPREHENSIVE METABOLIC PANEL (CC13)
Albumin: 3.9 g/dL (ref 3.5–5.0)
BUN: 10 mg/dL (ref 7.0–26.0)
CO2: 25 mEq/L (ref 22–29)
Calcium: 10.3 mg/dL (ref 8.4–10.4)
Chloride: 101 mEq/L (ref 98–107)
Glucose: 85 mg/dl (ref 70–99)
Potassium: 3.9 mEq/L (ref 3.5–5.1)
Sodium: 138 mEq/L (ref 136–145)
Total Protein: 8.1 g/dL (ref 6.4–8.3)

## 2012-03-17 LAB — CBC WITH DIFFERENTIAL/PLATELET
Basophils Absolute: 0.1 10*3/uL (ref 0.0–0.1)
Eosinophils Absolute: 0.2 10*3/uL (ref 0.0–0.5)
HGB: 13.1 g/dL (ref 11.6–15.9)
MCV: 94.1 fL (ref 79.5–101.0)
MONO#: 0.6 10*3/uL (ref 0.1–0.9)
NEUT#: 3.6 10*3/uL (ref 1.5–6.5)
RBC: 4.11 10*6/uL (ref 3.70–5.45)
RDW: 13.7 % (ref 11.2–14.5)
WBC: 6.3 10*3/uL (ref 3.9–10.3)

## 2012-03-17 MED ORDER — IOHEXOL 300 MG/ML  SOLN
80.0000 mL | Freq: Once | INTRAMUSCULAR | Status: AC | PRN
  Administered 2012-03-17: 80 mL via INTRAVENOUS

## 2012-03-21 ENCOUNTER — Telehealth: Payer: Self-pay | Admitting: Internal Medicine

## 2012-03-21 ENCOUNTER — Ambulatory Visit (HOSPITAL_BASED_OUTPATIENT_CLINIC_OR_DEPARTMENT_OTHER): Payer: Medicare HMO | Admitting: Internal Medicine

## 2012-03-21 VITALS — BP 166/80 | HR 120 | Temp 97.8°F | Resp 24 | Ht 64.0 in | Wt 158.6 lb

## 2012-03-21 DIAGNOSIS — R911 Solitary pulmonary nodule: Secondary | ICD-10-CM

## 2012-03-21 DIAGNOSIS — C3490 Malignant neoplasm of unspecified part of unspecified bronchus or lung: Secondary | ICD-10-CM

## 2012-03-21 DIAGNOSIS — C3410 Malignant neoplasm of upper lobe, unspecified bronchus or lung: Secondary | ICD-10-CM

## 2012-03-21 NOTE — Progress Notes (Signed)
[facility] Telephone:(336) [phone]   Fax:([phone]  OFFICE PROGRESS NOTE  Waymon Budge, MD [address] [employer] [place] [address]  PRINCIPAL DIAGNOSIS: Unresectable stage IIB (T3 N0 M0) non-small cell lung cancer, adenocarcinoma diagnosed in June 2011.   PRIOR THERAPY:  1. Status post concurrent chemoradiation with weekly carboplatin and paclitaxel. Last dose was given February 24, 2010. 2. Consolidation chemotherapy with carboplatin and Alimta, status post 3 cycles. Last dose was given June 09, 2010.  CURRENT THERAPY: Observation.  INTERVAL HISTORY: Maria Lucero 61 y.o. female returns to the clinic today for six-month followup visit accompanied by her husband. [place] she [place] a few of her appointments. She continues to have fatigue and shortness of breath at baseline and increased with exertion. She also has some right-sided chest wall pain. She denied having any significant cough or hemoptysis. She has no weight loss or night sweats. The patient has repeat CT scan of the chest performed recently and she is here for evaluation and discussion of her scan results.  MEDICAL HISTORY: Past Medical History  Diagnosis Date  . lung ca dx'd 11/2009    chemo comp 05/2010  . Hypertension   . Migraine headache   . Anxiety   . Urticaria   . Low back pain syndrome   . Hyperlipidemia   . Hypercholesterolemia   . COPD (chronic obstructive pulmonary disease)   . Depression   . Tobacco abuse   . Myalgia and myositis, unspecified     ALLERGIES:  is allergic to methocarbamol; morphine sulfate er beads; aspirin; clarithromycin; codeine; and morphine.  MEDICATIONS:  Current Outpatient Prescriptions  Medication Sig Dispense Refill  . albuterol (PROVENTIL) (2.5 MG/3ML) 0.083% nebulizer solution Take 3 mLs (2.5 mg total) by nebulization 3 (three) times daily as needed for  wheezing. DX COPD  270 mL  prn  . albuterol (VENTOLIN HFA) 108 (90 BASE) MCG/ACT inhaler Inhale 2 puffs into the lungs every 6 (six) hours as needed. Wheezing       . buPROPion (WELLBUTRIN SR) 150 MG 12 hr tablet Take 150 mg by mouth 2 (two) times daily.        . clonazePAM (KLONOPIN) 1 MG tablet Take 1-2 mg by mouth 3 (three) times daily.       . divalproex (DEPAKOTE) 500 MG EC tablet Take 1,500 mg by mouth at bedtime.       . DULoxetine (CYMBALTA) 60 MG capsule Take 60 mg by mouth daily before breakfast.       . estradiol (VIVELLE-DOT) 0.025 MG/24HR Place 1 patch onto the skin once a week. Tuesday       . folic acid (FOLVITE) 400 MCG tablet Take 400 mcg by mouth daily.        . hydroxychloroquine (PLAQUENIL) 200 MG tablet Take 200 mg by mouth daily.      Marland Kitchen omeprazole (PRILOSEC) 20 MG capsule Take 20 mg by mouth daily.        Marland Kitchen oxyCODONE-acetaminophen (PERCOCET) 10-325 MG per tablet Take 1 tablet by mouth every 4 (four) hours as needed. Pain       . QUEtiapine (SEROQUEL) 25 MG tablet Take 25 mg by mouth at bedtime.        . simvastatin (ZOCOR) 40 MG tablet Take 1 tablet by mouth daily.      . promethazine (PHENERGAN) 25 MG tablet Take 1  tablet by mouth Every 6 hours as needed.      . SUMAtriptan (IMITREX) 100 MG tablet Take 100 mg by mouth every 2 (two) hours as needed. Headache       . temazepam (RESTORIL) 30 MG capsule Take 30 mg by mouth at bedtime as needed. Sleep       . zaleplon (SONATA) 10 MG capsule Take 10 mg by mouth At bedtime as needed.      Marland Kitchen DISCONTD: lisinopril-hydrochlorothiazide (PRINZIDE,ZESTORETIC) 10-12.5 MG per tablet Take 1 tablet by mouth.         SURGICAL HISTORY:  Past Surgical History  Procedure Date  . Back surgery     x 4 L3-S1 1996,2000, 2002  . Vesicovaginal fistula closure w/ tah   . Appendectomy   . Bronchoscopy 2011  . Lung biopsy 2011    needle    REVIEW OF SYSTEMS:  A comprehensive review of systems was negative except for: Constitutional:  positive for fatigue Respiratory: positive for dyspnea on exertion and pleurisy/chest pain   PHYSICAL EXAMINATION: General appearance: alert, cooperative and no distress Head: Normocephalic, without obvious abnormality, atraumatic Neck: no adenopathy Lymph nodes: Cervical, supraclavicular, and axillary nodes normal. Resp: clear to auscultation bilaterally Cardio: regular rate and rhythm, S1, S2 normal, no murmur, click, rub or gallop GI: soft, non-tender; bowel sounds normal; no masses,  no organomegaly Extremities: extremities normal, atraumatic, no cyanosis or edema  ECOG PERFORMANCE STATUS: 1 - Symptomatic but completely ambulatory  Blood pressure 166/80, pulse 120, temperature 97.8 F (36.6 C), resp. rate 24, height 5\' 4"  (1.626 m), weight 158 lb 9.6 oz (71.94 kg).  LABORATORY DATA: Lab Results  Component Value Date   WBC 6.3 03/17/2012   HGB 13.1 03/17/2012   HCT 38.7 03/17/2012   MCV 94.1 03/17/2012   PLT 332 03/17/2012      Chemistry      Component Value Date/Time   NA 138 03/17/2012 1004   NA 145 08/24/2011 1430   NA 140 07/02/2010 0517   K 3.9 03/17/2012 1004   K 4.3 08/24/2011 1430   K 3.9 DELTA CHECK NOTED 07/02/2010 0517   CL 101 03/17/2012 1004   CL 95* 08/24/2011 1430   CL 103 07/02/2010 0517   CO2 25 03/17/2012 1004   CO2 31 08/24/2011 1430   CO2 28 07/02/2010 0517   BUN 10.0 03/17/2012 1004   BUN 15 08/24/2011 1430   BUN 10 07/02/2010 0517   CREATININE 0.9 03/17/2012 1004   CREATININE 0.8 08/24/2011 1430   CREATININE 0.70 07/02/2010 0517      Component Value Date/Time   CALCIUM 10.3 03/17/2012 1004   CALCIUM 8.6 08/24/2011 1430   CALCIUM 8.9 07/02/2010 0517   ALKPHOS 78 03/17/2012 1004   ALKPHOS 71 08/24/2011 1430   ALKPHOS 98 06/28/2010 0600   AST 14 03/17/2012 1004   AST 15 08/24/2011 1430   AST 14 06/28/2010 0600   ALT 12 03/17/2012 1004   ALT 10 06/28/2010 0600   BILITOT 0.30 03/17/2012 1004   BILITOT 0.50 08/24/2011 1430   BILITOT 0.4 06/28/2010 0600        RADIOGRAPHIC STUDIES: Ct Chest W Contrast  03/17/2012  *RADIOLOGY REPORT*  Clinical Data: Lung cancer, status post XRT and chemotherapy  CT CHEST WITH CONTRAST  Technique:  Multidetector CT imaging of the chest was performed following the standard protocol during bolus administration of intravenous contrast.  Contrast: 80mL OMNIPAQUE IOHEXOL 300 MG/ML  SOLN  Comparison: 08/24/2011  Findings: No enlarged  axillary or supraclavicular lymph nodes. There is a right chest wall porta-catheter with tip in the cavoatrial junction.  No mediastinal or hilar adenopathy.  Small pericardial effusion noted.  This is similar in volume to previous exam.  Postsurgical change and volume loss is noted within the left hemithorax.  There is associated leftward shift of the mediastinum. Left upper lobe fibrosis and consolidation is noted consistent with changes of external beam radiation.  Pulmonary nodule in the right lower lobe is again noted measuring 9.6 mm, image 37.  This is increased from 6.2 mm previously.  Review of the visualized bony structures shows multiple left lateral rib fracture deformities, likely postsurgical.  Postop change from posterior fixation of the lower thoracic and lumbar spine noted.  Limited imaging through the upper abdomen is unremarkable.  IMPRESSION:  1.  The suspicious pulmonary nodule in the right lower lobe has increased in size from previous exam. 2.  Stable postsurgical and postradiation change involving the left hemithorax. 3.  Persistent small pericardial effusion.   Original Report Authenticated By: Rosealee Albee, M.D.     ASSESSMENT: This is a very pleasant 61 years old white female with history of unresectable stage IIB non-small cell lung cancer, adenocarcinoma diagnosed in June of 2011 is status post concurrent chemoradiation followed by consolidation chemotherapy and has been observation since in December of 2011. The patient has some evidence for enlargement of right lower  lobe pulmonary nodule suspicious for synchronous lung cancer.   PLAN: I discussed the scan results and showed the images to the patient and her husband. I recommended for her to see Dr. Michell Heinrich for consideration of stereotactic radio therapy to the right lower lobe pulmonary nodule. I will leave it up to Dr. Michell Heinrich whether the patient will need repeat PET scan or tissue biopsy before her radiotherapy. I would see the patient back for followup visit in 3 months with repeat CT scan of the chest. She was advised to call me immediately if she has any concerning symptoms in the interval.  All questions were answered. The patient knows to call the clinic with any problems, questions or concerns. We can certainly see the patient much sooner if necessary.

## 2012-03-21 NOTE — Patient Instructions (Signed)
CT scan of the chest showed increasing size of the right lower lobe nodule. I will arrange a followup appointment for you with Dr. Michell Heinrich for consideration of stereotactic radiotherapy. Followup in 3 months with repeat CT scan of the chest

## 2012-03-21 NOTE — Telephone Encounter (Signed)
gve the pt her dec 2013 appt calendar along with the ct scan appt. S/w karen in rad and the pt has an appt with dr Michell Heinrich in oct

## 2012-03-24 ENCOUNTER — Encounter: Payer: Self-pay | Admitting: Radiation Oncology

## 2012-03-24 ENCOUNTER — Ambulatory Visit
Admission: RE | Admit: 2012-03-24 | Discharge: 2012-03-24 | Disposition: A | Discharge status: Home / Self Care | Payer: Medicare HMO | Source: Ambulatory Visit | Attending: Radiation Oncology | Admitting: Radiation Oncology

## 2012-03-24 VITALS — BP 176/81 | HR 117 | Resp 24

## 2012-03-24 DIAGNOSIS — Z510 Encounter for antineoplastic radiation therapy: Secondary | ICD-10-CM | POA: Insufficient documentation

## 2012-03-24 DIAGNOSIS — I10 Essential (primary) hypertension: Secondary | ICD-10-CM | POA: Insufficient documentation

## 2012-03-24 DIAGNOSIS — J4489 Other specified chronic obstructive pulmonary disease: Secondary | ICD-10-CM | POA: Insufficient documentation

## 2012-03-24 DIAGNOSIS — J449 Chronic obstructive pulmonary disease, unspecified: Secondary | ICD-10-CM | POA: Insufficient documentation

## 2012-03-24 DIAGNOSIS — Z808 Family history of malignant neoplasm of other organs or systems: Secondary | ICD-10-CM | POA: Insufficient documentation

## 2012-03-24 DIAGNOSIS — Z8051 Family history of malignant neoplasm of kidney: Secondary | ICD-10-CM | POA: Insufficient documentation

## 2012-03-24 DIAGNOSIS — Z87891 Personal history of nicotine dependence: Secondary | ICD-10-CM | POA: Insufficient documentation

## 2012-03-24 DIAGNOSIS — E785 Hyperlipidemia, unspecified: Secondary | ICD-10-CM | POA: Insufficient documentation

## 2012-03-24 DIAGNOSIS — M129 Arthropathy, unspecified: Secondary | ICD-10-CM | POA: Insufficient documentation

## 2012-03-24 DIAGNOSIS — C3490 Malignant neoplasm of unspecified part of unspecified bronchus or lung: Secondary | ICD-10-CM

## 2012-03-24 DIAGNOSIS — Z801 Family history of malignant neoplasm of trachea, bronchus and lung: Secondary | ICD-10-CM | POA: Insufficient documentation

## 2012-03-24 DIAGNOSIS — R109 Unspecified abdominal pain: Secondary | ICD-10-CM | POA: Insufficient documentation

## 2012-03-24 DIAGNOSIS — Z79899 Other long term (current) drug therapy: Secondary | ICD-10-CM | POA: Insufficient documentation

## 2012-03-24 DIAGNOSIS — Z800 Family history of malignant neoplasm of digestive organs: Secondary | ICD-10-CM | POA: Insufficient documentation

## 2012-03-24 DIAGNOSIS — G43909 Migraine, unspecified, not intractable, without status migrainosus: Secondary | ICD-10-CM | POA: Insufficient documentation

## 2012-03-24 DIAGNOSIS — E7800 Pure hypercholesterolemia, unspecified: Secondary | ICD-10-CM | POA: Insufficient documentation

## 2012-03-24 DIAGNOSIS — Z9981 Dependence on supplemental oxygen: Secondary | ICD-10-CM | POA: Insufficient documentation

## 2012-03-24 HISTORY — DX: Unspecified cataract: H26.9

## 2012-03-24 HISTORY — DX: Unspecified asthma, uncomplicated: J45.909

## 2012-03-24 HISTORY — DX: Allergy, unspecified, initial encounter: T78.40XA

## 2012-03-24 HISTORY — DX: Encounter for other specified aftercare: Z51.89

## 2012-03-24 HISTORY — DX: Reserved for inherently not codable concepts without codable children: IMO0001

## 2012-03-24 NOTE — Progress Notes (Signed)
61 year old female. Married with a son and daughter.  T3N0M0 non-small cell lung ca, adenocarcinoma dx 11/2009. Status post concurrent chemoradiation with weekly carboplatin and paclitaxel; last dose 02/24/10. Consolidation chemotherapy with carboplatin and Alimta, s/p 3 cycles; last dose 06/09/2010. Returning for consideration of stereotatic radio therapy to right lower lobe pulmonary nodule. Dr. Arbutus Ped leaving the decision for a repeat PET and biopsy up to Dr. Michell Heinrich.     AX: methocarbamol, morphine, aspirin, clarithromycin, codeine, morphine Radiation therapy HX: 01/13/2010-02/28/2010 66 Gy at 2 Gy per fraction times 33 fraction to left upper lobe lung.  No indication of a pacemaker.

## 2012-03-24 NOTE — Progress Notes (Signed)
Please see the Nurse Progress Note in the MD Initial Consult Encounter for this patient. 

## 2012-03-24 NOTE — Progress Notes (Signed)
Patient ambulateing with portable oxygen tank, steady gait, but got sob  From elevator to nursing room,  93% on 2 liters nasal canula oxygen,, rr slowed while resting, purse lip breathing , hand tremors, patient alert,oriented x3,  Pain mostly rough in right side mid back, stated pateint, hand tremors, right hip pain also, had blood work and xray done on right hand yesterday, no fracture,  Poor appetite, taste changes, drinking water juice,cola,,,, and coffee, says nauseated now drinking coffee, usually right after she eats, stopped phenergan takes warm cola with nausea, very weak and fatigued Has a lot of arthritis in her back 3:12 PM

## 2012-03-25 NOTE — Addendum Note (Signed)
Encounter addended by: Josetta Wigal Mintz Aarini Slee, RN on: 03/25/2012  5:33 PM<BR>     Documentation filed: Charges VN

## 2012-03-25 NOTE — Addendum Note (Signed)
Encounter addended by: Delynn Flavin, RN on: 03/25/2012  5:33 PM<BR>     Documentation filed: Charges VN

## 2012-03-25 NOTE — Progress Notes (Signed)
Radiation Oncology         (336) [phone] ________________________________  Initial outpatient Consultation  Name: Maria Lucero MRN: [id number]  Date: 03/24/2012  DOB: 1950-11-02  REFERRING PHYSICIAN: Si Gaul, MD  DIAGNOSIS: The encounter diagnosis was Malignant neoplasm of bronchus and lung, unspecified site.  HISTORY OF PRESENT ILLNESS::Maria Lucero is a 61 y.o. female well known to me for treatment of her T3 N0 non-small cell lung cancer of the left upper lobe. She was treated with concurrent chemoradiation and completed that in December of 2011. She has been monitored since that time with repeat CT scans and was found in March 7 enlarging right lower lobe lesion. PET scan showed this to be minimally active however was only 7 mm. An attempt at biopsy showed only benign tissue. Six-month followup was recommended and she had that performed on 03/17/2012. It had enlarged to a centimeter. The left apical scar tissue was stable. No evidence of metastatic disease was noted on the previous PET scan in March. Dr. Arbutus Ped referred her to me for consideration of SBRT to this right lower lobe lesion. Maria Lucero unfortunately has been diagnosed with a short arthritis in the interim and continues to struggle with back pain and dyspnea. Her husband is going for workup of some sort of muscular disorder at [facility]. She has stable headaches but nothing new. Continues to have right flank pain. On the CT of the chest there was some evidence of left lateral rib fractures but no evidence of any rib fractures explanation of her right rib and flank pain. She reports no hemoptysis. She has gained about 30 pounds to 2 steroid she was placed on for control of her rheumatoid arthritis. She is now off steroids and think she is losing some of that weight.  PREVIOUS RADIATION THERAPY: Yes 64 gray to the left apex completed December 2011  PAST MEDICAL HISTORY:  has a past medical history of lung ca (dx'd 11/2009);  Hypertension; Migraine headache; Anxiety; Urticaria; Low back pain syndrome; Hyperlipidemia; Hypercholesterolemia; COPD (chronic obstructive pulmonary disease); Depression; Tobacco abuse; Myalgia and myositis, unspecified; Lung cancer; Bipolar 1 disorder; Allergy; Asthma; Blood transfusion; and Cataract.    PAST SURGICAL HISTORY: Past Surgical History  Procedure Date  . Back surgery     x 4 L3-S1 1996,2000, 2002  . Vesicovaginal fistula closure w/ tah   . Appendectomy   . Bronchoscopy 2011  . Lung biopsy 2011    needle  . Tonsillectomy   . Abdominal hysterectomy     with b/l oophoorectomy age 12    FAMILY HISTORY: family history includes Brain cancer in an unspecified family member; Colon cancer in an unspecified family member; Heart disease in an unspecified family member; Kidney cancer in her maternal grandmother; Lung cancer in an unspecified family member; and Rheum arthritis in her mother.  SOCIAL HISTORY:  reports that she quit smoking about 3 years ago. Her smoking use included Cigarettes. She has a 51 pack-year smoking history. She does not have any smokeless tobacco history on file. She reports that she does not drink alcohol or use illicit drugs.  ALLERGIES: Methocarbamol; Morphine sulfate er beads; Aspirin; Clarithromycin; Codeine; and Morphine  MEDICATIONS:  Current Outpatient Prescriptions  Medication Sig Dispense Refill  . albuterol (PROVENTIL) (2.5 MG/3ML) 0.083% nebulizer solution Take 3 mLs (2.5 mg total) by nebulization 3 (three) times daily as needed for wheezing. DX COPD  270 mL  prn  . albuterol (VENTOLIN HFA) 108 (90 BASE) MCG/ACT inhaler Inhale 2  puffs into the lungs every 6 (six) hours as needed. Wheezing       . buPROPion (WELLBUTRIN SR) 150 MG 12 hr tablet Take 150 mg by mouth 2 (two) times daily.        . clonazePAM (KLONOPIN) 1 MG tablet Take 1-2 mg by mouth 3 (three) times daily.       . divalproex (DEPAKOTE) 500 MG EC tablet Take 1,500 mg by mouth at  bedtime.       . DULoxetine (CYMBALTA) 60 MG capsule Take 60 mg by mouth daily before breakfast.       . estradiol (VIVELLE-DOT) 0.025 MG/24HR Place 1 patch onto the skin once a week. Tuesday       . folic acid (FOLVITE) 400 MCG tablet Take 400 mcg by mouth daily.        . hydroxychloroquine (PLAQUENIL) 200 MG tablet Take 200 mg by mouth daily.      Marland Kitchen omeprazole (PRILOSEC) 20 MG capsule Take 20 mg by mouth daily.        Marland Kitchen oxyCODONE-acetaminophen (PERCOCET) 10-325 MG per tablet Take 1 tablet by mouth every 4 (four) hours as needed. Pain       . promethazine (PHENERGAN) 25 MG tablet Take 1 tablet by mouth Every 6 hours as needed.      Marland Kitchen QUEtiapine (SEROQUEL) 25 MG tablet Take 25 mg by mouth at bedtime.        . simvastatin (ZOCOR) 40 MG tablet Take 1 tablet by mouth daily.      . SUMAtriptan (IMITREX) 100 MG tablet Take 100 mg by mouth every 2 (two) hours as needed. Headache       . temazepam (RESTORIL) 30 MG capsule Take 30 mg by mouth at bedtime as needed. Sleep       . zaleplon (SONATA) 10 MG capsule Take 10 mg by mouth At bedtime as needed.      Marland Kitchen DISCONTD: lisinopril-hydrochlorothiazide (PRINZIDE,ZESTORETIC) 10-12.5 MG per tablet Take 1 tablet by mouth.         REVIEW OF SYSTEMS:  A 15 point review of systems is documented in the electronic medical record. This was obtained by the nursing staff. However, I reviewed this with the patient to discuss relevant findings and make appropriate changes.  Pertinent items are noted in HPI.   PHYSICAL EXAM:  blood pressure is 176/81 and her pulse is 117. Her respiration is 24 and oxygen saturation is 93%.   . She is a pleasant female who appears older than her stated age. She has nasal cannula in place. She is alert and oriented x3. She has discomfort with even slight movements in her chair. She is 5 out of 5 strength in her bilateral upper and lower extremities. She has coarse breath sounds bilaterally.  LABORATORY DATA:  Lab Results  Component  Value Date   WBC 6.3 03/17/2012   HGB 13.1 03/17/2012   HCT 38.7 03/17/2012   MCV 94.1 03/17/2012   PLT 332 03/17/2012   Lab Results  Component Value Date   NA 138 03/17/2012   K 3.9 03/17/2012   CL 101 03/17/2012   CO2 25 03/17/2012   Lab Results  Component Value Date   ALT 12 03/17/2012   AST 14 03/17/2012   ALKPHOS 78 03/17/2012   BILITOT 0.30 03/17/2012     RADIOGRAPHY: Ct Chest W Contrast  03/17/2012  *RADIOLOGY REPORT*  Clinical Data: Lung cancer, status post XRT and chemotherapy  CT CHEST WITH CONTRAST  Technique:  Multidetector CT imaging of the chest was performed following the standard protocol during bolus administration of intravenous contrast.  Contrast: 80mL OMNIPAQUE IOHEXOL 300 MG/ML  SOLN  Comparison: 08/24/2011  Findings: No enlarged axillary or supraclavicular lymph nodes. There is a right chest wall porta-catheter with tip in the cavoatrial junction.  No mediastinal or hilar adenopathy.  Small pericardial effusion noted.  This is similar in volume to previous exam.  Postsurgical change and volume loss is noted within the left hemithorax.  There is associated leftward shift of the mediastinum. Left upper lobe fibrosis and consolidation is noted consistent with changes of external beam radiation.  Pulmonary nodule in the right lower lobe is again noted measuring 9.6 mm, image 37.  This is increased from 6.2 mm previously.  Review of the visualized bony structures shows multiple left lateral rib fracture deformities, likely postsurgical.  Postop change from posterior fixation of the lower thoracic and lumbar spine noted.  Limited imaging through the upper abdomen is unremarkable.  IMPRESSION:  1.  The suspicious pulmonary nodule in the right lower lobe has increased in size from previous exam. 2.  Stable postsurgical and postradiation change involving the left hemithorax. 3.  Persistent small pericardial effusion.   Original Report Authenticated By: Rosealee Albee, M.D.         IMPRESSION: 61 year old female with a previous left upper lobe lung cancer now with an enlarging right lower lobe nodule  PLAN: I discussed with Lydiah and her husband the time course of this nodule. It was not present back on her scan in June of 2012. We discussed possible biopsy or PET scan. There did not appear to be any enlarged mediastinal or her adenopathy at this time. I worry that with her oxygen dependence then another biopsy may the be nondiagnostic or result in lung collapse and chest tube placement for which she might not recover. Given that this has grown so quickly over the past 6 months I think it's prudent to proceed on with a biopsy. I discussed all these issues with her and her husband. They do not want a biopsy of the brother proceed on with radiation at this time. We discussed the process of simulation the placement of Restoril compression. We discussed her using pain medications prior to her treatment to help make her back for comfortable. We discussed 5 or 3 treatments as an outpatient. Discussed damage to critical normal structures and the risk of toxicity in a retreatment situation. We discussed the option of observing him repeating a PET scan in 6 months. He would like to proceed on with treatment my schedule her for simulation next week. She has signed informed consent and agree to proceed forward.   I spent 60 minutes  face to face with the patient and more than 50% of that time was spent in counseling and/or coordination of care.   ------------------------------------------------  Lurline Hare, MD

## 2012-03-28 NOTE — Addendum Note (Signed)
Encounter addended by: Delynn Flavin, RN on: 03/28/2012  7:20 PM<BR>     Documentation filed: Charges VN

## 2012-03-31 ENCOUNTER — Ambulatory Visit: Payer: Medicare HMO

## 2012-03-31 ENCOUNTER — Ambulatory Visit: Payer: Medicare HMO | Admitting: Radiation Oncology

## 2012-03-31 ENCOUNTER — Ambulatory Visit: Admission: RE | Admit: 2012-03-31 | Payer: Medicare HMO | Source: Ambulatory Visit

## 2012-03-31 ENCOUNTER — Ambulatory Visit
Admission: RE | Admit: 2012-03-31 | Discharge: 2012-03-31 | Disposition: A | Discharge status: Home / Self Care | Payer: Medicare HMO | Source: Ambulatory Visit | Attending: Radiation Oncology | Admitting: Radiation Oncology

## 2012-03-31 DIAGNOSIS — C3490 Malignant neoplasm of unspecified part of unspecified bronchus or lung: Secondary | ICD-10-CM

## 2012-03-31 NOTE — Progress Notes (Signed)
[facility] Radiation Oncology Simulation and Treatment Planning Note   Name: VONZELLA ALTHAUS MRN: [id number]  Date: 03/31/2012  DOB: [birth year]  Status: outpatient  DIAGNOSIS: There were no encounter diagnoses.  SIDE: right  CONSENT VERIFIED: yes  SET UP AND IMMOBILIZATION: Patient is setup supine in a vac loc with a custom moldable pillow for head and neck immobilization   NARRATIVE: The patient was brought to the CT Simulation planning suite.  Identity was confirmed.  All relevant records and images related to the planned course of therapy were reviewed.  Then, the patient was positioned in a stable reproducible clinical set-up for radiation therapy.  CT images were obtained.  Skin markings were placed.  A four dimensional simulation was then performed to track tumor movement throughout the patients' breathing cycle. The CT images were loaded into the planning software where the target and avoidance structures were contoured.  The GTV was outlined on the free breathing, 4D and MIP image sets.  The radiation prescription was entered and confirmed.   TREATMENT PLANNING NOTE:  Treatment planning then occurred. I have requested 3D simulation with [facility] of the spinal cord, total lungs and gross tumor volume. I have also requested mlcs and an isodose plan.   Special treatment procedure will be performed as Judie Bonus will be receiving high dose per fraction.  She has also received previous radiation to this area. We will create a composite plan to account for previously received lung doses.

## 2012-04-11 ENCOUNTER — Ambulatory Visit
Admission: RE | Admit: 2012-04-11 | Discharge: 2012-04-11 | Disposition: A | Discharge status: Home / Self Care | Payer: Medicare HMO | Source: Ambulatory Visit | Attending: Radiation Oncology | Admitting: Radiation Oncology

## 2012-04-11 DIAGNOSIS — R222 Localized swelling, mass and lump, trunk: Secondary | ICD-10-CM

## 2012-04-11 NOTE — Progress Notes (Signed)
  Radiation Oncology         (336) [phone] ________________________________  Name: Maria Lucero MRN: [id number]  Date: 04/11/2012  DOB: 1951-01-19  Stereotactic Body Radiotherapy Treatment Procedure Note  NARRATIVE:  Maria Lucero was brought to the stereotactic radiation treatment machine and placed supine on the CT couch. The patient was set up for stereotactic body radiotherapy on the body fix pillow.  3D TREATMENT PLANNING AND DOSIMETRY:  The patient's radiation plan was reviewed and approved prior to starting treatment.  It showed 3-dimensional radiation distributions overlaid onto the planning CT.  The [facility] for the target structures as well as the organs at risk were reviewed. The documentation of this is filed in the radiation oncology EMR.  SIMULATION VERIFICATION:  The patient underwent CT imaging on the treatment unit.  These were carefully aligned to document that the ablative radiation dose would cover the target volume and maximally spare the nearby organs at risk according to the planned distribution.  SPECIAL TREATMENT PROCEDURE: Maria Lucero received high dose ablative stereotactic body radiotherapy to the planned target volume without unforeseen complications. Treatment was delivered uneventfully. The high doses associated with stereotactic body radiotherapy and the significant potential risks require careful treatment set up and patient monitoring constituting a special treatment procedure   STEREOTACTIC TREATMENT MANAGEMENT:  Following delivery, the patient was evaluated clinically. The patient tolerated treatment without significant acute effects, and was discharged to home in stable condition.    PLAN: Continue treatment as planned.  ________________________________  Billie Lade, PhD, MD

## 2012-04-13 ENCOUNTER — Ambulatory Visit
Admission: RE | Admit: 2012-04-13 | Discharge: 2012-04-13 | Disposition: A | Discharge status: Home / Self Care | Payer: Medicare HMO | Source: Ambulatory Visit | Attending: Radiation Oncology | Admitting: Radiation Oncology

## 2012-04-13 VITALS — BP 134/64 | HR 98 | Temp 98.1°F | Resp 26 | Wt 154.0 lb

## 2012-04-13 DIAGNOSIS — C3490 Malignant neoplasm of unspecified part of unspecified bronchus or lung: Secondary | ICD-10-CM

## 2012-04-13 NOTE — Progress Notes (Signed)
As Ms. Kolarik was escorted from the nursing area after seeing her physician she became increasingly SOB, pale and weak.  She was placed in a chair in the radiation oncology waiting area immediately.  Her vital signs were obtained with a BP of 131/78, pulse 120 respirations 24.  After assessing vitals.  Her respirations slowed to 16 and her O2 sat was 94%.  Discovered her O2 tank was empty so placed on O2 at 2 liters per minute. Her Family member was in attendance and was instructed to make sure she has a replacement tank in her car for her next visit. After sitting for ~ 30 minutes she was escorted to her car via W/C with oxygen in place via [place].  Requested she use a wheelchair upon each visit to the Cancer Center to conserve her energy and to prevent exacerbation of her SOB.  She and her family member agreed.

## 2012-04-13 NOTE — Progress Notes (Signed)
Patient has chronic history of rheumatoid arthritis.Pain level 8.Has had increase in back, spine and hip pain since starting radiation treatments.Currently take percocet 10/325.

## 2012-04-13 NOTE — Progress Notes (Signed)
  Radiation Oncology         [phone] ________________________________  Name: Maria Lucero MRN: [id number]  Date: 04/13/2012  DOB: [birth year]  Stereotactic Body Radiotherapy Treatment Procedure Note  NARRATIVE:  Maria Lucero was brought to the stereotactic radiation treatment machine and placed supine on the CT couch. The patient was set up for stereotactic body radiotherapy on the body fix pillow.  3D TREATMENT PLANNING AND DOSIMETRY:  The patient's radiation plan was reviewed and approved prior to starting treatment.  It showed 3-dimensional radiation distributions overlaid onto the planning CT.  The [facility] for the target structures as well as the organs at risk were reviewed. The documentation of this is filed in the radiation oncology EMR.  SIMULATION VERIFICATION:  The patient underwent CT imaging on the treatment unit.  These were carefully aligned to document that the ablative radiation dose would cover the target volume and maximally spare the nearby organs at risk according to the planned distribution.  SPECIAL TREATMENT PROCEDURE: Maria Lucero received high dose ablative stereotactic body radiotherapy to the planned target volume without unforeseen complications. Treatment was delivered uneventfully. The high doses associated with stereotactic body radiotherapy and the significant potential risks require careful treatment set up and patient monitoring constituting a special treatment procedure   STEREOTACTIC TREATMENT MANAGEMENT:  Following delivery, the patient was evaluated clinically. The patient tolerated treatment without significant acute effects, and was discharged to home in stable condition.    PLAN: Continue treatment as planned.  ________________________________  Billie Lade, PhD, MD

## 2012-04-13 NOTE — Progress Notes (Signed)
Department of Radiation Oncology  Phone:  [phone] Fax:        [phone]  Weekly Treatment Note    Name: Maria Lucero Date: 04/13/2012 MRN: [id number] DOB: [birth year]   Current dose: 24 Gy  Current fraction: 2   MEDICATIONS: Current Outpatient Prescriptions  Medication Sig Dispense Refill  . albuterol (PROVENTIL) (2.5 MG/3ML) 0.083% nebulizer solution Take 3 mLs (2.5 mg total) by nebulization 3 (three) times daily as needed for wheezing. DX COPD  270 mL  prn  . albuterol (VENTOLIN HFA) 108 (90 BASE) MCG/ACT inhaler Inhale 2 puffs into the lungs every 6 (six) hours as needed. Wheezing       . buPROPion (WELLBUTRIN SR) 150 MG 12 hr tablet Take 150 mg by mouth 2 (two) times daily.        . clonazePAM (KLONOPIN) 1 MG tablet Take 1-2 mg by mouth 3 (three) times daily.       . divalproex (DEPAKOTE) 500 MG EC tablet Take 1,500 mg by mouth at bedtime.       . DULoxetine (CYMBALTA) 60 MG capsule Take 60 mg by mouth daily before breakfast.       . estradiol (VIVELLE-DOT) 0.025 MG/24HR Place 1 patch onto the skin once a week. Tuesday       . folic acid (FOLVITE) 400 MCG tablet Take 400 mcg by mouth daily.        . hydroxychloroquine (PLAQUENIL) 200 MG tablet Take 200 mg by mouth daily.      Marland Kitchen omeprazole (PRILOSEC) 20 MG capsule Take 20 mg by mouth daily.        Marland Kitchen oxyCODONE-acetaminophen (PERCOCET) 10-325 MG per tablet Take 1 tablet by mouth every 4 (four) hours as needed. Pain       . promethazine (PHENERGAN) 25 MG tablet Take 1 tablet by mouth Every 6 hours as needed.      Marland Kitchen QUEtiapine (SEROQUEL) 25 MG tablet Take 25 mg by mouth at bedtime.        . simvastatin (ZOCOR) 40 MG tablet Take 1 tablet by mouth daily.      . SUMAtriptan (IMITREX) 100 MG tablet Take 100 mg by mouth every 2 (two) hours as needed. Headache       . temazepam (RESTORIL) 30 MG capsule Take 30 mg by mouth at bedtime as needed. Sleep       . zaleplon (SONATA) 10 MG capsule Take 10 mg by mouth At bedtime  as needed.      Marland Kitchen DISCONTD: lisinopril-hydrochlorothiazide (PRINZIDE,ZESTORETIC) 10-12.5 MG per tablet Take 1 tablet by mouth.          ALLERGIES: Methocarbamol; Morphine sulfate er beads; Aspirin; Clarithromycin; Codeine; and Morphine   LABORATORY DATA:  Lab Results  Component Value Date   WBC 6.3 03/17/2012   HGB 13.1 03/17/2012   HCT 38.7 03/17/2012   MCV 94.1 03/17/2012   PLT 332 03/17/2012   Lab Results  Component Value Date   NA 138 03/17/2012   K 3.9 03/17/2012   CL 101 03/17/2012   CO2 25 03/17/2012   Lab Results  Component Value Date   ALT 12 03/17/2012   AST 14 03/17/2012   ALKPHOS 78 03/17/2012   BILITOT 0.30 03/17/2012     NARRATIVE: Maria Lucero was seen today for weekly treatment management. The chart was checked and the patient's films were reviewed. The patient is having significant pain on the table. She describes her pain at a level of 8 today. She  does have significant arthritis with chronic pain issues. She is taking Percocet 10/325 and has been taking 1 prior to treatment each day. This has not been sufficient and she asked to be seen today.  PHYSICAL EXAMINATION: weight is 154 lb (69.854 kg). Her temperature is 98.1 F (36.7 C). Her blood pressure is 134/64 and her pulse is 98. Her respiration is 26 and oxygen saturation is 100%.        ASSESSMENT: The patient is doing satisfactorily with treatment, although she is having significant pain.  PLAN: We will continue with the patient's radiation treatment as planned. The patient will increase her Percocet to 2 tablets 30 minutes prior to her treatment. This is a significant increase and I believe that this will help with her pain as she has been able to go through the simulation and 2 treatments thus far. She only has 3 remaining. She is to let us know if this does not help her pain enough.

## 2012-04-15 ENCOUNTER — Encounter: Payer: Self-pay | Admitting: Radiation Oncology

## 2012-04-15 ENCOUNTER — Ambulatory Visit
Admission: RE | Admit: 2012-04-15 | Discharge: 2012-04-15 | Disposition: A | Discharge status: Home / Self Care | Payer: Medicare HMO | Source: Ambulatory Visit | Attending: Radiation Oncology | Admitting: Radiation Oncology

## 2012-04-15 DIAGNOSIS — C3490 Malignant neoplasm of unspecified part of unspecified bronchus or lung: Secondary | ICD-10-CM

## 2012-04-15 NOTE — Progress Notes (Signed)
Weekly Management Note Current Dose: 48 Gy  Projected Dose: 60 Gy   Narrative:  The patient presents for routine under treatment assessment.  CBCT/MVCT images/Port film x-rays were reviewed.  The chart was checked. Doing well. Some pain due to positioning. Taking pain meds and muscle relaxer.   Physical Findings: Weight:  . Unchanged  Impression:  The patient is tolerating radiation.  Plan:  Continue treatment as planned.

## 2012-04-18 ENCOUNTER — Ambulatory Visit
Admission: RE | Admit: 2012-04-18 | Discharge: 2012-04-18 | Disposition: A | Discharge status: Home / Self Care | Payer: Medicare HMO | Source: Ambulatory Visit | Attending: Radiation Oncology | Admitting: Radiation Oncology

## 2012-04-18 DIAGNOSIS — C3490 Malignant neoplasm of unspecified part of unspecified bronchus or lung: Secondary | ICD-10-CM

## 2012-04-18 NOTE — Progress Notes (Signed)
  Radiation Oncology         [phone] ________________________________  Name: Maria Lucero MRN: [id number]  Date: 04/18/2012  DOB: 14-Sep-1950  Stereotactic Body Radiotherapy Treatment Procedure Note  NARRATIVE:  Maria Lucero was brought to the stereotactic radiation treatment machine and placed supine on the CT couch. The patient was set up for stereotactic body radiotherapy on the body fix pillow.  3D TREATMENT PLANNING AND DOSIMETRY:  The patient's radiation plan was reviewed and approved prior to starting treatment.  It showed 3-dimensional radiation distributions overlaid onto the planning CT.  The [facility] for the target structures as well as the organs at risk were reviewed. The documentation of this is filed in the radiation oncology EMR.  SIMULATION VERIFICATION:  The patient underwent CT imaging on the treatment unit.  These were carefully aligned to document that the ablative radiation dose would cover the target volume and maximally spare the nearby organs at risk according to the planned distribution.  SPECIAL TREATMENT PROCEDURE: Maria Lucero received high dose ablative stereotactic body radiotherapy to the planned target volume without unforeseen complications. Treatment was delivered uneventfully. The high doses associated with stereotactic body radiotherapy and the significant potential risks require careful treatment set up and patient monitoring constituting a special treatment procedure   STEREOTACTIC TREATMENT MANAGEMENT:  Following delivery, the patient was evaluated clinically. The patient tolerated treatment without significant acute effects, and was discharged to home in stable condition.    PLAN: Continue treatment as planned.  ________________________________  Billie Lade, PhD, MD

## 2012-04-19 ENCOUNTER — Telehealth: Payer: Self-pay | Admitting: Radiation Oncology

## 2012-04-19 NOTE — Telephone Encounter (Signed)
Patient's husband return message this writer left. Informed him that the patient would need to come tomorrow, Wednesday, 04/20/2012 at 2 pm for her final treatment. He verbalized they would be present for tomorrow's final treatment. He expressed appreciation for the call because there was some confusion. Routed this message to Dr. Michell Heinrich.

## 2012-04-19 NOTE — Telephone Encounter (Signed)
Called to inform patient she would need to be present for final treatment on 04/20/2012 at 2pm. No answer at home or on her cell therefore, left message requesting return call.

## 2012-04-20 ENCOUNTER — Ambulatory Visit
Admission: RE | Admit: 2012-04-20 | Discharge: 2012-04-20 | Disposition: A | Discharge status: Home / Self Care | Payer: Medicare HMO | Source: Ambulatory Visit | Attending: Radiation Oncology | Admitting: Radiation Oncology

## 2012-04-20 ENCOUNTER — Encounter: Payer: Self-pay | Admitting: Radiation Oncology

## 2012-04-20 ENCOUNTER — Ambulatory Visit: Payer: Medicare HMO

## 2012-04-20 NOTE — Progress Notes (Signed)
  Radiation Oncology         [phone] ________________________________  Name: Maria Lucero MRN: [id number]  Date: 04/20/2012  DOB: February 12, 1951  Stereotactic Body Radiotherapy Treatment Procedure Note  NARRATIVE:  Maria Lucero was brought to the stereotactic radiation treatment machine and placed supine on the CT couch. The patient was set up for stereotactic body radiotherapy on the body fix pillow.  3D TREATMENT PLANNING AND DOSIMETRY:  The patient's radiation plan was reviewed and approved prior to starting treatment.  It showed 3-dimensional radiation distributions overlaid onto the planning CT.  The [facility] for the target structures as well as the organs at risk were reviewed. The documentation of this is filed in the radiation oncology EMR.  SIMULATION VERIFICATION:  The patient underwent CT imaging on the treatment unit.  These were carefully aligned to document that the ablative radiation dose would cover the left upper lobe  target volume and maximally spare the nearby organs at risk according to the planned distribution.  SPECIAL TREATMENT PROCEDURE: Maria Lucero received high dose ablative stereotactic body radiotherapy to the planned target volume without unforeseen complications. Treatment was delivered uneventfully. Maria Lucero receive a further 1200 cGy for a cumulative dose of 6000 cGy in 5 sessions completing her therapy today. The high doses associated with stereotactic body radiotherapy and the significant potential risks require careful treatment set up and patient monitoring constituting a special treatment procedure   STEREOTACTIC TREATMENT MANAGEMENT:  Following delivery, the patient was evaluated clinically. The patient tolerated treatment without significant acute effects, and was discharged to home in stable condition.    PLAN: Continue treatment as planned.  ------------------------------------------------        Maryln Gottron, MD

## 2012-04-21 NOTE — Progress Notes (Signed)
  Radiation Oncology         [phone] ________________________________  Name: Maria Lucero MRN: [id number]  Date: 04/15/2012  DOB: 08-Jun-1951  Stereotactic Body Radiotherapy Treatment Procedure Note  NARRATIVE:  Maria Lucero was brought to the stereotactic radiation treatment machine and placed supine on the CT couch. The patient was set up for stereotactic body radiotherapy on the body fix pillow.  3D TREATMENT PLANNING AND DOSIMETRY:  The patient's radiation plan was reviewed and approved prior to starting treatment.  It showed 3-dimensional radiation distributions overlaid onto the planning CT.  The [facility] for the target structures as well as the organs at risk were reviewed. The documentation of this is filed in the radiation oncology EMR.  SIMULATION VERIFICATION:  The patient underwent CT imaging on the treatment unit.  These were carefully aligned to document that the ablative radiation dose would cover the target volume and maximally spare the nearby organs at risk according to the planned distribution.  SPECIAL TREATMENT PROCEDURE: Maria Lucero received high dose ablative stereotactic body radiotherapy to the planned target volume without unforeseen complications. Treatment was delivered uneventfully. The high doses associated with stereotactic body radiotherapy and the significant potential risks require careful treatment set up and patient monitoring constituting a special treatment procedure   STEREOTACTIC TREATMENT MANAGEMENT:  Following delivery, the patient was evaluated clinically. The patient tolerated treatment without significant acute effects, and was discharged to home in stable condition.    PLAN: Continue treatment as planned. She will return in 2 days for her 4th fraction.

## 2012-04-21 NOTE — Progress Notes (Addendum)
°  Radiation Oncology         [phone] ________________________________  Name: Maria Lucero MRN: [id number]  Date: 04/20/2012  DOB: 1951-03-01  End of Treatment Note  Diagnosis:   T1 Right Lower Lobe  Indication for treatment:  Curative     Radiation treatment dates:  04/11/2012, 04/13/2012, 04/15/2012, 04/18/2012, 04/20/2012  Site/dose:   Right lower lobe / 60 Gy in 5 fractions at 12 Gy per fraction  Beams/energy:   Dynamic conformal arcs/ 6 MV photons.   Narrative: The patient tolerated radiation treatment relatively well.   She struggled with her continuing back pain.   Plan: The patient has completed radiation treatment. The patient will return to radiation oncology clinic for routine followup in one month. I advised them to call or return sooner if they have any questions or concerns related to their recovery or treatment.  ------------------------------------------------  Lurline Hare, MD

## 2012-05-20 ENCOUNTER — Ambulatory Visit
Admission: RE | Admit: 2012-05-20 | Discharge: 2012-05-20 | Disposition: A | Discharge status: Home / Self Care | Payer: Medicare HMO | Source: Ambulatory Visit | Attending: Radiation Oncology | Admitting: Radiation Oncology

## 2012-05-20 ENCOUNTER — Encounter: Payer: Self-pay | Admitting: Radiation Oncology

## 2012-05-20 VITALS — Wt 154.0 lb

## 2012-05-20 DIAGNOSIS — C3490 Malignant neoplasm of unspecified part of unspecified bronchus or lung: Secondary | ICD-10-CM

## 2012-05-20 HISTORY — DX: Unspecified osteoarthritis, unspecified site: M19.90

## 2012-05-20 NOTE — Progress Notes (Signed)
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  She is feeling worse. She has a productive cough. She has continued low back pain and shortness of breath. She spends most of her time in bed. This is secondary to back pain. She is not running any fever. She's not having any chills. She was extremely tired after radiation treatments. She also said that the pain on the right side of her body improved after SBRT.  Allergies:  Allergies  Allergen Reactions  . Methocarbamol Nausea And Vomiting  . Morphine Sulfate Er Beads Other (See Comments)    confusion  . Aspirin Nausea And Vomiting  . Clarithromycin Other (See Comments)    Stomach problems   . Codeine Nausea And Vomiting  . Morphine Other (See Comments)    Passed out    Medications:  Current Outpatient Prescriptions  Medication Sig Dispense Refill  . albuterol (PROVENTIL) (2.5 MG/3ML) 0.083% nebulizer solution Take 3 mLs (2.5 mg total) by nebulization 3 (three) times daily as needed for wheezing. DX COPD  270 mL  prn  . albuterol (VENTOLIN HFA) 108 (90 BASE) MCG/ACT inhaler Inhale 2 puffs into the lungs every 6 (six) hours as needed. Wheezing       . buPROPion (WELLBUTRIN SR) 150 MG 12 hr tablet Take 150 mg by mouth 2 (two) times daily.        . clonazePAM (KLONOPIN) 1 MG tablet Take 1-2 mg by mouth 3 (three) times daily.       . divalproex (DEPAKOTE) 500 MG EC tablet Take 1,500 mg by mouth at bedtime.       . DULoxetine (CYMBALTA) 60 MG capsule Take 60 mg by mouth daily before breakfast.       . estradiol (VIVELLE-DOT) 0.025 MG/24HR Place 1 patch onto the skin once a  week. Tuesday       . folic acid (FOLVITE) 400 MCG tablet Take 400 mcg by mouth daily.        . hydroxychloroquine (PLAQUENIL) 200 MG tablet Take 200 mg by mouth daily.      Marland Kitchen omeprazole (PRILOSEC) 20 MG capsule Take 20 mg by mouth daily.        Marland Kitchen oxyCODONE-acetaminophen (PERCOCET) 10-325 MG per tablet Take 1 tablet by mouth every 4 (four) hours as needed. Pain       . promethazine (PHENERGAN) 25 MG tablet Take 1 tablet by mouth Every 6 hours as needed.      Marland Kitchen QUEtiapine (SEROQUEL) 25 MG tablet Take 25 mg by mouth at bedtime.        . simvastatin (ZOCOR) 40 MG tablet Take 1 tablet by mouth daily.      . SUMAtriptan (IMITREX) 100 MG tablet Take 100 mg by mouth every 2 (two) hours as needed. Headache       . temazepam (RESTORIL) 30 MG capsule Take 30 mg by mouth at bedtime as needed. Sleep       . zaleplon (SONATA) 10 MG capsule Take 10 mg by mouth At bedtime as needed.      . [DISCONTINUED] lisinopril-hydrochlorothiazide (PRINZIDE,ZESTORETIC) 10-12.5  MG per tablet Take 1 tablet by mouth.         Physical Exam:   weight is 154 lb (69.854 kg).  She is a pleasant female with a nasal cannula in place. She has not respiratory distress. Her lungs are clear.  IMPRESSION: Maria Lucero is a 61 y.o. female status post SBRT to a right lower lobe lesion with resolving acute effects of treatment  PLAN:  I will plan on seeing her back in 6 months with a scan. She has a followup appointment with Dr. Arbutus Ped in December. He also has appointment next week with Dr. Maple Hudson.    Maria Hare, MD

## 2012-05-20 NOTE — Progress Notes (Addendum)
Patient presents to the clinic today accompanied by her husband for a follow up appointment with Dr. Michell Heinrich. Patient alert and oriented to person, place, and time. No distress noted. Slow steady gait noted. Pleasant affect noted. Patient reports pain all over related to effects of arthritis. Patient states, "I am going to get a shot when I leave here for my arthritis." patient reports a persistent productive cough with green sputum. Patient reports occasional difficulty swallowing. Patient glad to learn she has an appointment with Dr. Maple Hudson on 05/31/2012 because she is experiencing increased shortness of breath. Continuous oxygen therapy 2 liters via nasal cannula. Patient reports unintended weight loss of 14 pounds since August.  Patient reports nausea, vomiting, migraines but, dizziness. Patient denies floaters.  Reported all findings to Dr. Michell Heinrich.

## 2012-05-31 ENCOUNTER — Ambulatory Visit (INDEPENDENT_AMBULATORY_CARE_PROVIDER_SITE_OTHER): Payer: Medicare HMO | Admitting: Internal Medicine

## 2012-05-31 ENCOUNTER — Encounter: Payer: Self-pay | Admitting: Internal Medicine

## 2012-05-31 VITALS — BP 122/64 | HR 111 | Ht 64.0 in | Wt 153.6 lb

## 2012-05-31 DIAGNOSIS — J441 Chronic obstructive pulmonary disease with (acute) exacerbation: Secondary | ICD-10-CM

## 2012-05-31 DIAGNOSIS — Z23 Encounter for immunization: Secondary | ICD-10-CM

## 2012-05-31 MED ORDER — FLUTICASONE FUROATE-VILANTEROL 100-25 MCG/INH IN AEPB: 1.0000 | INHALATION_SPRAY | Freq: Every day | RESPIRATORY_TRACT | Status: DC

## 2012-05-31 NOTE — Progress Notes (Signed)
11/26/11- 61 yoF former smoker followed for COPD, history of adeno CA left lung/ chemo/ XRT LOV 08/29/10....husband here She follows with Dr. Mohamed/[facility] Oxygen 2 L/Advanced. Nebulizer used once or twice daily. Stays hoarse "most of the time". Episodic, cough and chest congestion. Last week her primary physician gave antibiotic and she is taking prednisone 1/2x5 mg tab, daily. She asks a letter for her DME about her hardship, hoping they can help her with nebulizer charges. PET 09/08/11- discussed with her IMPRESSION:  1. Enlarging pulmonary nodule at the right lung base with mild  metabolic activity is concerning for metastasis. Recommend  percutaneous biopsy.  2. Consolidation with mild metabolic activity within the left  lower lobe most consistent with radiation change.  3. No evidence of mediastinal metastasis or distant metastasis.  Original Report Authenticated By: Genevive Bi, M.D.   05/31/12- 69 yoF former smoker followed for COPD, history of adeno CA left lung/ chemo/ XRT FOLLOWS FOR: having trouble with nasal cannula-material changed. SOB has gotten worse. Ribs and back pain-doubles patient over in pain. Husband here. PCP Dr. Tiburcio Pea manages her pain control. Known rheumatoid arthritis. Rods from previous spine surgery. Always some shortness of breath. Bilateral rib and back pain for the past 2 months. She is aware she fractured left ribs. Since last year she had carbon monoxide poisoning but the source was repaired. For lung cancer had radiation therapy October 2013. Follows with Dr. Mohamed/oncology in late December with CT chest scheduled. Continues oxygen 2 L/Advanced. For wheeze, nebulizer works better than her metered inhalers. CT 03/17/12- reviewed IMPRESSION:  1. The suspicious pulmonary nodule in the right lower lobe has  increased in size from previous exam.  2. Stable postsurgical and postradiation change involving the left  hemithorax.  3. Persistent  small pericardial effusion.  Original Report Authenticated By: Rosealee Albee, M.D.    ROS-see HPI Constitutional:   No-   weight loss, night sweats, fevers, chills, fatigue, lassitude. HEENT:   No-  headaches, difficulty swallowing, tooth/dental problems, sore throat,       No-  sneezing, itching, ear ache, nasal congestion, post nasal drip,  CV:  No-   chest pain, orthopnea, PND, swelling in lower extremities, anasarca, dizziness, palpitations Resp: + shortness of breath with exertion or at rest.              No- productive cough,  + non-productive cough,  No- coughing up of blood.              No-   change in color of mucus.  + wheezing.   Skin: No-   rash or lesions. GI:  No-   heartburn, indigestion, abdominal pain, nausea, vomiting,  GU: . MS:  No-   joint pain or swelling.  . Neuro-     nothing unusual Psych:  No- change in mood or affect. + depression or anxiety.  No memory loss.  OBJ- Physical Exam BP 122/64  Pulse 111  Ht 5\' 4"  (1.626 m)  Wt 153 lb 9.6 oz (69.673 kg)  BMI 26.37 kg/m2  SpO2 100%  O2 2L General- Alert, Oriented, Affect-appropriate, Distress- none acute Skin- rash-none, lesions- none, excoriation- none Lymphadenopathy- none Head- atraumatic            Eyes- Gross vision intact, PERRLA, conjunctivae and secretions clear            Ears- Hearing, canals-normal            Nose- Clear, no-Septal dev, mucus,  polyps, erosion, perforation             Throat- Mallampati II , mucosa clear , drainage- none, tonsils- atrophic Neck- flexible , trachea midline, no stridor , thyroid nl, carotid no bruit Chest - symmetrical excursion , unlabored           Heart/CV- RRR , no murmur , no gallop  , no rub, nl s1 s2                           - JVD- none , edema- none, stasis changes- none, varices- none           Lung- clear to P&A, wheeze- none, cough-minimal dry , dullness-none, rub- none. +Mouth breathing.           Chest wall-  Abd-  Br/ Gen/ Rectal- Not done, not  indicated Extrem- cyanosis- none, clubbing, none, atrophy- none, strength- nl Neuro- grossly intact to observation

## 2012-05-31 NOTE — Patient Instructions (Addendum)
Dr Tiburcio Pea will manage your pain medicines  Sample Breo Ellipta  Inhaler 1 puff then rinse mouth, once daily  Dr Arbutus Ped has scheduled your follow-up CT scan, and might consider if a bone scan would be of any help assessing your  back and rib pain.   Flu vax

## 2012-06-03 ENCOUNTER — Telehealth: Payer: Self-pay | Admitting: Internal Medicine

## 2012-06-03 NOTE — Telephone Encounter (Signed)
Pt was confused about some info she saw on her AVS. There was a lab and CT ordered and she was not sure what these were. I advised according to chart these were ordered by Dr. Arbutus Ped for later on in December. Pt states understanding. Carron Curie, CMA

## 2012-06-11 NOTE — Assessment & Plan Note (Signed)
No infection currently. Persistent dyspnea reflects her COPD, musculoskeletal stiffness related to her arthritis, and parenchymal changes from radiation therapy. There is a reactive component which response to her nebulized bronchodilators. It may help to treat anxiety. Plan-for comparison, and because we have little clear benefit to offer, I suggested she try Breo Ellipta .

## 2012-06-17 ENCOUNTER — Telehealth: Payer: Self-pay | Admitting: Internal Medicine

## 2012-06-17 NOTE — Telephone Encounter (Signed)
Patient needing clarification on bone density vs ct scan.  Patient now understands that she is only having CT done at 12pm at [facility] and is to go get blood drawn prior at 1115 [facility]---spoke w Deidra in Radiology at [facility].  Nothing further needed at this time

## 2012-06-20 ENCOUNTER — Other Ambulatory Visit (HOSPITAL_BASED_OUTPATIENT_CLINIC_OR_DEPARTMENT_OTHER): Payer: Medicare HMO | Admitting: Lab

## 2012-06-20 ENCOUNTER — Ambulatory Visit (HOSPITAL_COMMUNITY)
Admission: RE | Admit: 2012-06-20 | Discharge: 2012-06-20 | Disposition: A | Discharge status: Home / Self Care | Payer: Medicare HMO | Source: Ambulatory Visit | Attending: Internal Medicine | Admitting: Internal Medicine

## 2012-06-20 DIAGNOSIS — Z87891 Personal history of nicotine dependence: Secondary | ICD-10-CM | POA: Insufficient documentation

## 2012-06-20 DIAGNOSIS — J449 Chronic obstructive pulmonary disease, unspecified: Secondary | ICD-10-CM | POA: Insufficient documentation

## 2012-06-20 DIAGNOSIS — C3410 Malignant neoplasm of upper lobe, unspecified bronchus or lung: Secondary | ICD-10-CM

## 2012-06-20 DIAGNOSIS — C3490 Malignant neoplasm of unspecified part of unspecified bronchus or lung: Secondary | ICD-10-CM

## 2012-06-20 DIAGNOSIS — J4489 Other specified chronic obstructive pulmonary disease: Secondary | ICD-10-CM | POA: Insufficient documentation

## 2012-06-20 DIAGNOSIS — Z9221 Personal history of antineoplastic chemotherapy: Secondary | ICD-10-CM | POA: Insufficient documentation

## 2012-06-20 LAB — CBC WITH DIFFERENTIAL/PLATELET
BASO%: 1 % (ref 0.0–2.0)
Basophils Absolute: 0 10e3/uL (ref 0.0–0.1)
EOS%: 2 % (ref 0.0–7.0)
Eosinophils Absolute: 0.1 10e3/uL (ref 0.0–0.5)
HCT: 33.7 % — ABNORMAL LOW (ref 34.8–46.6)
HGB: 11.7 g/dL (ref 11.6–15.9)
LYMPH%: 25.5 % (ref 14.0–49.7)
MCH: 33.6 pg (ref 25.1–34.0)
MCHC: 34.6 g/dL (ref 31.5–36.0)
MCV: 97 fL (ref 79.5–101.0)
MONO#: 0.4 10e3/uL (ref 0.1–0.9)
MONO%: 9.3 % (ref 0.0–14.0)
NEUT#: 2.9 10e3/uL (ref 1.5–6.5)
NEUT%: 62.2 % (ref 38.4–76.8)
Platelets: 318 10e3/uL (ref 145–400)
RBC: 3.47 10e6/uL — ABNORMAL LOW (ref 3.70–5.45)
RDW: 14.9 % — ABNORMAL HIGH (ref 11.2–14.5)
WBC: 4.7 10e3/uL (ref 3.9–10.3)
lymph#: 1.2 10e3/uL (ref 0.9–3.3)

## 2012-06-20 LAB — COMPREHENSIVE METABOLIC PANEL (CC13)
AST: 9 U/L (ref 5–34)
Albumin: 3.6 g/dL (ref 3.5–5.0)
Alkaline Phosphatase: 75 U/L (ref 40–150)
BUN: 10 mg/dL (ref 7.0–26.0)
Potassium: 3.8 mEq/L (ref 3.5–5.1)
Sodium: 139 mEq/L (ref 136–145)
Total Protein: 7.5 g/dL (ref 6.4–8.3)

## 2012-06-20 MED ORDER — IOHEXOL 300 MG/ML  SOLN
80.0000 mL | Freq: Once | INTRAMUSCULAR | Status: AC | PRN
  Administered 2012-06-20: 80 mL via INTRAVENOUS

## 2012-06-23 ENCOUNTER — Ambulatory Visit: Payer: Medicare HMO | Admitting: Internal Medicine

## 2012-06-23 ENCOUNTER — Telehealth: Payer: Self-pay | Admitting: Internal Medicine

## 2012-06-23 NOTE — Telephone Encounter (Signed)
pt called in and stated that she was ill and req to r/s to next week,diane aware     anne

## 2012-06-27 ENCOUNTER — Encounter: Payer: Self-pay | Admitting: Internal Medicine

## 2012-06-27 ENCOUNTER — Telehealth: Payer: Self-pay | Admitting: Internal Medicine

## 2012-06-27 ENCOUNTER — Ambulatory Visit (HOSPITAL_BASED_OUTPATIENT_CLINIC_OR_DEPARTMENT_OTHER): Payer: Medicare HMO | Admitting: Internal Medicine

## 2012-06-27 VITALS — BP 146/86 | HR 104 | Temp 98.2°F | Resp 22 | Ht 64.0 in | Wt 151.0 lb

## 2012-06-27 DIAGNOSIS — C3410 Malignant neoplasm of upper lobe, unspecified bronchus or lung: Secondary | ICD-10-CM

## 2012-06-27 DIAGNOSIS — C3490 Malignant neoplasm of unspecified part of unspecified bronchus or lung: Secondary | ICD-10-CM

## 2012-06-27 NOTE — Telephone Encounter (Signed)
gv and printed pt appt schedule for June and July 2014....the patient aware central scheduling will contact with d/t of ct.

## 2012-06-27 NOTE — Progress Notes (Signed)
[facility] [phone]   [phone]  OFFICE PROGRESS NOTE  [name], MD [facility] [address] [place] [address]  PRINCIPAL DIAGNOSIS: Unresectable stage IIB (T3 N0 M0) non-small cell lung cancer, adenocarcinoma diagnosed in June 2011.   PRIOR THERAPY:  1. Status post concurrent chemoradiation with weekly carboplatin and paclitaxel. Last dose was given February 24, 2010. 2. Consolidation chemotherapy with carboplatin and Alimta, status post 3 cycles. Last dose was given June 09, 2010. 3. Curative radiotherapy to a right lower lobe nodule under the care of Dr. Michell Heinrich completed 04/20/2012.  CURRENT THERAPY: Observation.  INTERVAL HISTORY: Maria Lucero 61 y.o. female returns to the clinic today for followup visit accompanied her husband. The patient is feeling fine today except for the shortness breath at baseline and increased with exertion. She also continues to have occasional nausea and diarrhea. She has been off chemotherapy for the last 2 years. She completed a course of curative radiotherapy to the right lower lobe lung nodule in October of 2013. The patient had repeat CT scan of the chest performed recently and she is here for evaluation and discussion of her scan results.  MEDICAL HISTORY: Past Medical History  Diagnosis Date  . lung ca dx'd 11/2009    chemo comp 05/2010  . Hypertension   . Migraine headache   . Anxiety   . Urticaria   . Low back pain syndrome   . Hyperlipidemia   . Hypercholesterolemia   . COPD (chronic obstructive pulmonary disease)   . Depression   . Tobacco abuse   . Myalgia and myositis, unspecified   . Lung cancer     invasive left upper lobe adenocarcinoma  . Bipolar 1 disorder   . Allergy   . Asthma     per pt stated  . Blood transfusion     several x  . Cataract     recent surgery b/l  . Arthritis     ALLERGIES:  is allergic to methocarbamol;  morphine sulfate er beads; aspirin; clarithromycin; codeine; and morphine.  MEDICATIONS:  Current Outpatient Prescriptions  Medication Sig Dispense Refill  . albuterol (PROVENTIL) (2.5 MG/3ML) 0.083% nebulizer solution Take 3 mLs (2.5 mg total) by nebulization 3 (three) times daily as needed for wheezing. DX COPD  270 mL  prn  . buPROPion (WELLBUTRIN SR) 150 MG 12 hr tablet Take 150 mg by mouth 2 (two) times daily.        . clonazePAM (KLONOPIN) 1 MG tablet Take 1-2 mg by mouth 3 (three) times daily.       . divalproex (DEPAKOTE) 500 MG EC tablet Take 1,500 mg by mouth at bedtime.       . DULoxetine (CYMBALTA) 60 MG capsule Take 60 mg by mouth daily before breakfast.       . estradiol (VIVELLE-DOT) 0.025 MG/24HR Place 1 patch onto the skin once a week. Tuesday       . Fluticasone Furoate-Vilanterol (BREO ELLIPTA) 100-25 MCG/INH AEPB Inhale 1 puff into the lungs daily.  14 each  0  . folic acid (FOLVITE) 400 MCG tablet Take 400 mcg by mouth daily.        . hydroxychloroquine (PLAQUENIL) 200 MG tablet Take 200 mg by mouth daily.      Marland Kitchen omeprazole (PRILOSEC) 20 MG capsule Take 20 mg by mouth daily.        Marland Kitchen oxyCODONE-acetaminophen (PERCOCET) 10-325 MG per tablet Take 1  tablet by mouth every 4 (four) hours as needed. Pain       . promethazine (PHENERGAN) 25 MG tablet Take 25 mg by mouth every 6 (six) hours as needed.      Marland Kitchen QUEtiapine (SEROQUEL) 25 MG tablet Take 25 mg by mouth at bedtime.        . simvastatin (ZOCOR) 40 MG tablet Take 1 tablet by mouth daily.      . SUMAtriptan (IMITREX) 100 MG tablet Take 100 mg by mouth every 2 (two) hours as needed. Headache       . temazepam (RESTORIL) 30 MG capsule Take 30 mg by mouth at bedtime as needed. Sleep       . [DISCONTINUED] lisinopril-hydrochlorothiazide (PRINZIDE,ZESTORETIC) 10-12.5 MG per tablet Take 1 tablet by mouth.         SURGICAL HISTORY:  Past Surgical History  Procedure Date  . Back surgery     x 4 L3-S1 1996,2000, 2002  .  Vesicovaginal fistula closure w/ tah   . Appendectomy   . Bronchoscopy 2011  . Lung biopsy 2011    needle  . Tonsillectomy   . Abdominal hysterectomy     with b/l oophoorectomy age 71    REVIEW OF SYSTEMS:  A comprehensive review of systems was negative except for: Constitutional: positive for fatigue Respiratory: positive for dyspnea on exertion Gastrointestinal: positive for diarrhea and nausea   PHYSICAL EXAMINATION: General appearance: alert, cooperative and no distress Head: Normocephalic, without obvious abnormality, atraumatic Neck: no adenopathy Lymph nodes: Cervical, supraclavicular, and axillary nodes normal. Resp: clear to auscultation bilaterally Cardio: regular rate and rhythm, S1, S2 normal, no murmur, click, rub or gallop GI: soft, non-tender; bowel sounds normal; no masses,  no organomegaly Extremities: extremities normal, atraumatic, no cyanosis or edema  ECOG PERFORMANCE STATUS: 1 - Symptomatic but completely ambulatory  There were no vitals taken for this visit.  LABORATORY DATA: Lab Results  Component Value Date   WBC 4.7 06/20/2012   HGB 11.7 06/20/2012   HCT 33.7* 06/20/2012   MCV 97.0 06/20/2012   PLT 318 06/20/2012      Chemistry      Component Value Date/Time   NA 139 06/20/2012 1119   NA 145 08/24/2011 1430   NA 140 07/02/2010 0517   K 3.8 06/20/2012 1119   K 4.3 08/24/2011 1430   K 3.9 DELTA CHECK NOTED 07/02/2010 0517   CL 101 06/20/2012 1119   CL 95* 08/24/2011 1430   CL 103 07/02/2010 0517   CO2 26 06/20/2012 1119   CO2 31 08/24/2011 1430   CO2 28 07/02/2010 0517   BUN 10.0 06/20/2012 1119   BUN 15 08/24/2011 1430   BUN 10 07/02/2010 0517   CREATININE 0.8 06/20/2012 1119   CREATININE 0.8 08/24/2011 1430   CREATININE 0.70 07/02/2010 0517      Component Value Date/Time   CALCIUM 9.4 06/20/2012 1119   CALCIUM 8.6 08/24/2011 1430   CALCIUM 8.9 07/02/2010 0517   ALKPHOS 75 06/20/2012 1119   ALKPHOS 71 08/24/2011 1430   ALKPHOS 98 06/28/2010 0600    AST 9 06/20/2012 1119   AST 15 08/24/2011 1430   AST 14 06/28/2010 0600   ALT <6 06/20/2012 1119   ALT 10 06/28/2010 0600   BILITOT 0.24 06/20/2012 1119   BILITOT 0.50 08/24/2011 1430   BILITOT 0.4 06/28/2010 0600       RADIOGRAPHIC STUDIES: Ct Chest W Contrast  06/20/2012  *RADIOLOGY REPORT*  Clinical Data: Lung cancer.  Shortness  of breath.  Cough.  Fatigue.  CT CHEST WITH CONTRAST  Technique:  Multidetector CT imaging of the chest was performed following the standard protocol during bolus administration of intravenous contrast.  Contrast: 80mL OMNIPAQUE IOHEXOL 300 MG/ML  SOLN  Comparison: 03/17/2012  Findings: Left hemithoracic volume loss noted with consolidative portions of the left mid lung similar to the prior exam and probably rotated related to radiation therapy.  Small pericardial effusion, reduced in size compared the prior.  No pathologic mediastinal adenopathy.  Severe emphysema noted.  Right lower lobe 1 cm pulmonary nodule with mildly spiculated margins is similar to the prior exam except that its margins are slightly more indistinct and spiculated. Similar degree of solid component of nodule.  Left lateral rib fractures are again observed.  These have a chronic appearance although the sixth rib fracture appears slightly widened compared to prior, possibly from hyperemia related bony demineralization.  Lower thoracic posterolateral rod pedicle screw fixation noted.  IMPRESSION:  1.  Stable right lower lobe pulmonary nodule at 1 cm. 2.  Stable postoperative and postradiation findings in the left lung. 3.  Mild reduction in size of the small pericardial effusion. 4.  Emphysema. 5.  Left lateral rib fractures.   Original Report Authenticated By: Gaylyn Rong, M.D.     ASSESSMENT: This is a very pleasant 61 years old white female with history of unresectable a stage IIb non-small cell lung cancer status post concurrent chemoradiation followed by consolidation chemotherapy and recent  curative radiotherapy to a right lower lobe lung nodule. The patient is doing fine and she has no evidence for disease progression.  PLAN: I discussed the scan results with the patient and her husband. I recommended for her to continue on observation for now with repeat CT scan of the chest with contrast in 6 months. She would continue her routine followup visit with her primary care physician as well as Dr. Maple Hudson for evaluation and management of her COPD. She was advised to call immediately if she has any concerning symptoms in the interval.  All questions were answered. The patient knows to call the clinic with any problems, questions or concerns. We can certainly see the patient much sooner if necessary.

## 2012-06-27 NOTE — Patient Instructions (Addendum)
No evidence for disease progression on his recent scan. Followup in 6 months with repeat CT scan of the chest 

## 2012-08-31 ENCOUNTER — Telehealth: Payer: Self-pay | Admitting: Internal Medicine

## 2012-08-31 NOTE — Telephone Encounter (Signed)
I spoke with pt pt. She is scheduled to see CDY on Friday at 9:30 for increase SOB. She is aware to call us if she worsens. Nothing further was needed

## 2012-09-02 ENCOUNTER — Ambulatory Visit: Payer: Medicare HMO | Admitting: Internal Medicine

## 2012-09-05 ENCOUNTER — Telehealth: Payer: Self-pay | Admitting: Internal Medicine

## 2012-09-05 NOTE — Telephone Encounter (Signed)
Made a future appt instead of an acute appt @ pt's request.  Antionette Fairy

## 2012-10-07 ENCOUNTER — Ambulatory Visit: Payer: Medicare HMO | Admitting: Internal Medicine

## 2012-10-27 ENCOUNTER — Encounter: Payer: Self-pay | Admitting: Gastroenterology

## 2012-11-10 ENCOUNTER — Emergency Department (HOSPITAL_COMMUNITY): Payer: No Typology Code available for payment source

## 2012-11-10 ENCOUNTER — Encounter (HOSPITAL_COMMUNITY): Payer: Self-pay | Admitting: Emergency Medicine

## 2012-11-10 ENCOUNTER — Emergency Department (HOSPITAL_COMMUNITY)
Admission: EM | Admit: 2012-11-10 | Discharge: 2012-11-10 | Disposition: A | Discharge status: Home / Self Care | Payer: No Typology Code available for payment source | Attending: Emergency Medicine | Admitting: Emergency Medicine

## 2012-11-10 DIAGNOSIS — Z87891 Personal history of nicotine dependence: Secondary | ICD-10-CM | POA: Insufficient documentation

## 2012-11-10 DIAGNOSIS — Z8669 Personal history of other diseases of the nervous system and sense organs: Secondary | ICD-10-CM | POA: Insufficient documentation

## 2012-11-10 DIAGNOSIS — Z85118 Personal history of other malignant neoplasm of bronchus and lung: Secondary | ICD-10-CM | POA: Insufficient documentation

## 2012-11-10 DIAGNOSIS — Z872 Personal history of diseases of the skin and subcutaneous tissue: Secondary | ICD-10-CM | POA: Insufficient documentation

## 2012-11-10 DIAGNOSIS — F319 Bipolar disorder, unspecified: Secondary | ICD-10-CM | POA: Insufficient documentation

## 2012-11-10 DIAGNOSIS — J45909 Unspecified asthma, uncomplicated: Secondary | ICD-10-CM | POA: Insufficient documentation

## 2012-11-10 DIAGNOSIS — F411 Generalized anxiety disorder: Secondary | ICD-10-CM | POA: Insufficient documentation

## 2012-11-10 DIAGNOSIS — S79919A Unspecified injury of unspecified hip, initial encounter: Secondary | ICD-10-CM | POA: Insufficient documentation

## 2012-11-10 DIAGNOSIS — Y9389 Activity, other specified: Secondary | ICD-10-CM | POA: Insufficient documentation

## 2012-11-10 DIAGNOSIS — S199XXA Unspecified injury of neck, initial encounter: Secondary | ICD-10-CM | POA: Insufficient documentation

## 2012-11-10 DIAGNOSIS — S0993XA Unspecified injury of face, initial encounter: Secondary | ICD-10-CM | POA: Insufficient documentation

## 2012-11-10 DIAGNOSIS — J438 Other emphysema: Secondary | ICD-10-CM | POA: Insufficient documentation

## 2012-11-10 DIAGNOSIS — Z79899 Other long term (current) drug therapy: Secondary | ICD-10-CM | POA: Insufficient documentation

## 2012-11-10 DIAGNOSIS — IMO0002 Reserved for concepts with insufficient information to code with codable children: Secondary | ICD-10-CM | POA: Insufficient documentation

## 2012-11-10 DIAGNOSIS — Y92410 Unspecified street and highway as the place of occurrence of the external cause: Secondary | ICD-10-CM | POA: Insufficient documentation

## 2012-11-10 DIAGNOSIS — Z9221 Personal history of antineoplastic chemotherapy: Secondary | ICD-10-CM | POA: Insufficient documentation

## 2012-11-10 DIAGNOSIS — Z9889 Other specified postprocedural states: Secondary | ICD-10-CM | POA: Insufficient documentation

## 2012-11-10 DIAGNOSIS — S8990XA Unspecified injury of unspecified lower leg, initial encounter: Secondary | ICD-10-CM | POA: Insufficient documentation

## 2012-11-10 DIAGNOSIS — Z8739 Personal history of other diseases of the musculoskeletal system and connective tissue: Secondary | ICD-10-CM | POA: Insufficient documentation

## 2012-11-10 DIAGNOSIS — Z9981 Dependence on supplemental oxygen: Secondary | ICD-10-CM | POA: Insufficient documentation

## 2012-11-10 MED ORDER — HYDROMORPHONE HCL PF 1 MG/ML IJ SOLN
1.0000 mg | Freq: Once | INTRAMUSCULAR | Status: AC
  Administered 2012-11-10: 1 mg via INTRAMUSCULAR
  Filled 2012-11-10: qty 1

## 2012-11-10 MED ORDER — PROMETHAZINE HCL 25 MG PO TABS: 25.0000 mg | ORAL_TABLET | Freq: Four times a day (QID) | ORAL | Status: DC | PRN

## 2012-11-10 MED ORDER — HYDROCODONE-ACETAMINOPHEN 5-325 MG PO TABS: 2.0000 | ORAL_TABLET | ORAL | Status: DC | PRN

## 2012-11-10 MED ORDER — CYCLOBENZAPRINE HCL 10 MG PO TABS: 10.0000 mg | ORAL_TABLET | Freq: Two times a day (BID) | ORAL | Status: DC | PRN

## 2012-11-10 MED ORDER — ONDANSETRON 4 MG PO TBDP
8.0000 mg | ORAL_TABLET | Freq: Once | ORAL | Status: AC
  Administered 2012-11-10: 8 mg via ORAL
  Filled 2012-11-10: qty 2
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
## 2012-11-11 ENCOUNTER — Encounter: Payer: Self-pay | Admitting: Radiation Oncology

## 2012-11-12 NOTE — ED Provider Notes (Signed)
Medical screening examination/treatment/procedure(s) were performed by non-physician practitioner and as supervising physician I was immediately available for consultation/collaboration.  Gerhard Munch, MD 11/12/12 [phone]

## 2012-11-17 ENCOUNTER — Telehealth: Payer: Self-pay | Admitting: *Deleted

## 2012-11-17 ENCOUNTER — Ambulatory Visit
Admission: RE | Admit: 2012-11-17 | Discharge: 2012-11-17 | Disposition: A | Discharge status: Home / Self Care | Payer: Medicare HMO | Source: Ambulatory Visit | Attending: Radiation Oncology | Admitting: Radiation Oncology

## 2012-11-17 HISTORY — DX: Personal history of irradiation: Z92.3

## 2012-11-17 NOTE — Progress Notes (Signed)
F/u s/p rad txs: RLL lung: 04/11/12; 04/13/12; 04/15/12; 04/18/12;, 04/20/12; 60GY/5 fx
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
## 2012-11-24 ENCOUNTER — Ambulatory Visit: Admission: RE | Admit: 2012-11-24 | Payer: Medicare HMO | Source: Ambulatory Visit | Admitting: Radiation Oncology

## 2012-11-29 ENCOUNTER — Encounter: Payer: Self-pay | Admitting: Internal Medicine

## 2012-11-29 ENCOUNTER — Ambulatory Visit (INDEPENDENT_AMBULATORY_CARE_PROVIDER_SITE_OTHER)
Admission: RE | Admit: 2012-11-29 | Discharge: 2012-11-29 | Disposition: A | Discharge status: Home / Self Care | Payer: Medicare HMO | Source: Ambulatory Visit | Attending: Internal Medicine | Admitting: Internal Medicine

## 2012-11-29 ENCOUNTER — Ambulatory Visit (INDEPENDENT_AMBULATORY_CARE_PROVIDER_SITE_OTHER): Payer: Medicare HMO | Admitting: Internal Medicine

## 2012-11-29 VITALS — BP 120/80 | HR 106 | Ht 63.0 in | Wt 145.4 lb

## 2012-11-29 DIAGNOSIS — J441 Chronic obstructive pulmonary disease with (acute) exacerbation: Secondary | ICD-10-CM

## 2012-11-29 DIAGNOSIS — C3490 Malignant neoplasm of unspecified part of unspecified bronchus or lung: Secondary | ICD-10-CM
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
## 2012-11-29 NOTE — Patient Instructions (Addendum)
Order- CXR   Right chest wall pain, COPD, hx lung Ca  Order- DME [facility] evaluate for portable O2 concentrator 2L/min   Dx COPD, lung Ca

## 2012-11-30 ENCOUNTER — Telehealth: Payer: Self-pay | Admitting: Internal Medicine

## 2012-11-30 NOTE — Telephone Encounter (Signed)
Pt could not recall her CXR results. i gave results again. Nothing further was needed

## 2012-12-02 ENCOUNTER — Ambulatory Visit: Admission: RE | Admit: 2012-12-02 | Payer: Medicare HMO | Source: Ambulatory Visit | Admitting: Radiation Oncology

## 2012-12-10 NOTE — Assessment & Plan Note (Signed)
Now being followed a long-term by oncology

## 2012-12-10 NOTE — Assessment & Plan Note (Signed)
Chest wall pain is aggravating this, but it sounds like a chronic bronchitis pattern. Plan-we discussed portable oxygen concentrators. She would need a backpack to be able to handle the weight Plan-DME [facility], evaluate portable concentrators

## 2012-12-13 ENCOUNTER — Telehealth: Payer: Self-pay | Admitting: Internal Medicine

## 2012-12-13 NOTE — Telephone Encounter (Signed)
sw, pt and advised on appt time change...pt ok and aware

## 2012-12-22 ENCOUNTER — Ambulatory Visit: Payer: Medicare HMO | Admitting: Radiation Oncology

## 2012-12-26 ENCOUNTER — Encounter (HOSPITAL_COMMUNITY): Payer: Self-pay

## 2012-12-26 ENCOUNTER — Ambulatory Visit (HOSPITAL_COMMUNITY)
Admission: RE | Admit: 2012-12-26 | Discharge: 2012-12-26 | Disposition: A | Discharge status: Home / Self Care | Payer: Medicare HMO | Source: Ambulatory Visit | Attending: Internal Medicine | Admitting: Internal Medicine

## 2012-12-26 ENCOUNTER — Other Ambulatory Visit (HOSPITAL_BASED_OUTPATIENT_CLINIC_OR_DEPARTMENT_OTHER): Payer: Medicare HMO | Admitting: Lab

## 2012-12-26 DIAGNOSIS — C3490 Malignant neoplasm of unspecified part of unspecified bronchus or lung: Secondary | ICD-10-CM

## 2012-12-26 DIAGNOSIS — C3410 Malignant neoplasm of upper lobe, unspecified bronchus or lung: Secondary | ICD-10-CM

## 2012-12-26 DIAGNOSIS — I319 Disease of pericardium, unspecified: Secondary | ICD-10-CM | POA: Insufficient documentation

## 2012-12-26 DIAGNOSIS — Z85118 Personal history of other malignant neoplasm of bronchus and lung: Secondary | ICD-10-CM | POA: Insufficient documentation

## 2012-12-26 DIAGNOSIS — J438 Other emphysema: Secondary | ICD-10-CM | POA: Insufficient documentation

## 2012-12-26 DIAGNOSIS — R911 Solitary pulmonary nodule: Secondary | ICD-10-CM | POA: Insufficient documentation

## 2012-12-26 LAB — COMPREHENSIVE METABOLIC PANEL (CC13)
Alkaline Phosphatase: 91 U/L (ref 40–150)
BUN: 10.5 mg/dL (ref 7.0–26.0)
Creatinine: 0.8 mg/dL (ref 0.6–1.1)
Glucose: 73 mg/dl (ref 70–140)
Sodium: 136 mEq/L (ref 136–145)
Total Bilirubin: 0.37 mg/dL (ref 0.20–1.20)
Total Protein: 8 g/dL (ref 6.4–8.3)

## 2012-12-26 LAB — CBC WITH DIFFERENTIAL/PLATELET
Basophils Absolute: 0 10*3/uL (ref 0.0–0.1)
Eosinophils Absolute: 0.4 10*3/uL (ref 0.0–0.5)
HCT: 34.9 % (ref 34.8–46.6)
HGB: 11.9 g/dL (ref 11.6–15.9)
LYMPH%: 21.3 % (ref 14.0–49.7)
MCV: 92 fL (ref 79.5–101.0)
MONO#: 0.5 10*3/uL (ref 0.1–0.9)
MONO%: 6.8 % (ref 0.0–14.0)
NEUT#: 4.5 10*3/uL (ref 1.5–6.5)
Platelets: 453 10*3/uL — ABNORMAL HIGH (ref 145–400)

## 2012-12-26 MED ORDER — IOHEXOL 300 MG/ML  SOLN
80.0000 mL | Freq: Once | INTRAMUSCULAR | Status: AC | PRN
  Administered 2012-12-26: 80 mL via INTRAVENOUS

## 2012-12-28 ENCOUNTER — Ambulatory Visit (HOSPITAL_BASED_OUTPATIENT_CLINIC_OR_DEPARTMENT_OTHER): Payer: Medicare HMO | Admitting: Internal Medicine

## 2012-12-28 ENCOUNTER — Encounter: Payer: Self-pay | Admitting: Internal Medicine

## 2012-12-28 ENCOUNTER — Ambulatory Visit: Payer: Medicare HMO | Admitting: Internal Medicine

## 2012-12-28 ENCOUNTER — Telehealth: Payer: Self-pay | Admitting: Internal Medicine

## 2012-12-28 VITALS — BP 121/58 | HR 119 | Temp 97.9°F | Resp 19 | Ht 63.0 in | Wt 146.8 lb

## 2012-12-28 DIAGNOSIS — C3490 Malignant neoplasm of unspecified part of unspecified bronchus or lung: Secondary | ICD-10-CM

## 2012-12-28 NOTE — Telephone Encounter (Signed)
gv pt appt schedule for July. [facility] will call w/pet appt. Pt aware.

## 2012-12-28 NOTE — Patient Instructions (Signed)
I ordered a PET scan for evaluation of the right lower lobe lung nodule. Followup visit in 2 weeks

## 2012-12-28 NOTE — Progress Notes (Signed)
[facility] [phone]   [phone]  OFFICE PROGRESS NOTE  Maria Blamer, MD [facility] [address] [place] [address]  PRINCIPAL DIAGNOSIS: Unresectable stage IIB (T3 N0 M0) non-small cell lung cancer, adenocarcinoma diagnosed in June 2011.   PRIOR THERAPY:  1. Status post concurrent chemoradiation with weekly carboplatin and paclitaxel. Last dose was given February 24, 2010. 2. Consolidation chemotherapy with carboplatin and Alimta, status post 3 cycles. Last dose was given June 09, 2010. 3. Curative radiotherapy to a right lower lobe nodule under the care of Dr. Michell Heinrich completed 04/20/2012.  CURRENT THERAPY: Observation.  INTERVAL HISTORY: Maria Lucero 62 y.o. female returns to the clinic today for followup visit accompanied by her husband. The patient continues to have the baseline shortness breath increased with exertion. She denied having any significant chest pain, cough or hemoptysis. She denied having any significant weight loss or night sweats. She had repeat CT scan of the chest performed recently and she is here for evaluation and discussion of her scan results.  MEDICAL HISTORY: Past Medical History  Diagnosis Date  . lung ca dx'd 11/2009    chemo comp 05/2010  . Hypertension   . Migraine headache   . Anxiety   . Urticaria   . Low back pain syndrome   . Hyperlipidemia   . Hypercholesterolemia   . COPD (chronic obstructive pulmonary disease)   . Depression   . Tobacco abuse   . Myalgia and myositis, unspecified   . Lung cancer     invasive left upper lobe adenocarcinoma  . Bipolar 1 disorder   . Allergy   . Asthma     per pt stated  . Blood transfusion     several x  . Cataract     recent surgery b/l  . Arthritis   . History of radiation therapy 04/11/12; 04/13/12; 04/15/12; 04/18/12; & 04/20/12    Rll lung 60Gy/5/fx    ALLERGIES:  is allergic to methocarbamol;  morphine sulfate er beads; aspirin; clarithromycin; codeine; and morphine.  MEDICATIONS:  Current Outpatient Prescriptions  Medication Sig Dispense Refill  . albuterol (PROVENTIL) (2.5 MG/3ML) 0.083% nebulizer solution Take 2.5 mg by nebulization every 6 (six) hours as needed for wheezing.      Marland Kitchen buPROPion (WELLBUTRIN) 100 MG tablet Take 100 mg by mouth 2 (two) times daily.      . clonazePAM (KLONOPIN) 1 MG tablet Take 1-2 mg by mouth 3 (three) times daily.       . cyclobenzaprine (FLEXERIL) 10 MG tablet Take 1 tablet (10 mg total) by mouth 2 (two) times daily as needed for muscle spasms.  20 tablet  0  . divalproex (DEPAKOTE) 250 MG DR tablet Take 750 mg by mouth at bedtime.      Marland Kitchen doxycycline (VIBRAMYCIN) 100 MG capsule Take 1 capsule by mouth 2 (two) times daily.      . DULoxetine (CYMBALTA) 60 MG capsule Take 60 mg by mouth daily before breakfast.       . folic acid (FOLVITE) 1 MG tablet Take 1 mg by mouth daily.      Marland Kitchen HYDROcodone-acetaminophen (NORCO/VICODIN) 5-325 MG per tablet Take 2 tablets by mouth every 4 (four) hours as needed for pain.  6 tablet  0  . hydroxychloroquine (PLAQUENIL) 200 MG tablet Take 400 mg by mouth daily.       Marland Kitchen omeprazole (PRILOSEC) 20 MG capsule Take 20 mg by mouth daily.        Marland Kitchen  oxyCODONE-acetaminophen (PERCOCET) 10-325 MG per tablet Take 1 tablet by mouth every 4 (four) hours as needed. Pain       . promethazine (PHENERGAN) 25 MG tablet Take 1 tablet (25 mg total) by mouth every 6 (six) hours as needed for nausea.  12 tablet  0  . QUEtiapine (SEROQUEL) 25 MG tablet Take 25 mg by mouth at bedtime.        . simvastatin (ZOCOR) 40 MG tablet Take 1 tablet by mouth daily.      . SUMAtriptan (IMITREX) 100 MG tablet Take 100 mg by mouth every 2 (two) hours as needed. Headache       . traMADol (ULTRAM) 50 MG tablet Take 50 mg by mouth every 6 (six) hours as needed for pain.      Marland Kitchen triamcinolone cream (KENALOG) 0.1 %       . [DISCONTINUED]  lisinopril-hydrochlorothiazide (PRINZIDE,ZESTORETIC) 10-12.5 MG per tablet Take 1 tablet by mouth.        No current facility-administered medications for this visit.    SURGICAL HISTORY:  Past Surgical History  Procedure Laterality Date  . Back surgery      x 4 L3-S1 1996,2000, 2002  . Vesicovaginal fistula closure w/ tah    . Appendectomy    . Bronchoscopy  2011  . Lung biopsy  2011    needle  . Tonsillectomy    . Abdominal hysterectomy      with b/l oophoorectomy age 51    REVIEW OF SYSTEMS:  A comprehensive review of systems was negative except for: Respiratory: positive for dyspnea on exertion   PHYSICAL EXAMINATION: General appearance: alert, cooperative and no distress Head: Normocephalic, without obvious abnormality, atraumatic Neck: no adenopathy Lymph nodes: Cervical, supraclavicular, and axillary nodes normal. Resp: wheezes bilaterally Cardio: regular rate and rhythm, S1, S2 normal, no murmur, click, rub or gallop GI: soft, non-tender; bowel sounds normal; no masses,  no organomegaly Extremities: extremities normal, atraumatic, no cyanosis or edema  ECOG PERFORMANCE STATUS: 1 - Symptomatic but completely ambulatory  Blood pressure 121/58, pulse 119, temperature 97.9 F (36.6 C), temperature source Oral, resp. rate 19, height 5\' 3"  (1.6 m), weight 146 lb 12.8 oz (66.588 kg).  LABORATORY DATA: Lab Results  Component Value Date   WBC 6.9 12/26/2012   HGB 11.9 12/26/2012   HCT 34.9 12/26/2012   MCV 92.0 12/26/2012   PLT 453* 12/26/2012      Chemistry      Component Value Date/Time   NA 136 12/26/2012 1409   NA 145 08/24/2011 1430   NA 140 07/02/2010 0517   K 4.2 12/26/2012 1409   K 4.3 08/24/2011 1430   K 3.9 DELTA CHECK NOTED 07/02/2010 0517   CL 101 06/20/2012 1119   CL 95* 08/24/2011 1430   CL 103 07/02/2010 0517   CO2 29 12/26/2012 1409   CO2 31 08/24/2011 1430   CO2 28 07/02/2010 0517   BUN 10.5 12/26/2012 1409   BUN 15 08/24/2011 1430   BUN 10 07/02/2010 0517    CREATININE 0.8 12/26/2012 1409   CREATININE 0.8 08/24/2011 1430   CREATININE 0.70 07/02/2010 0517      Component Value Date/Time   CALCIUM 10.1 12/26/2012 1409   CALCIUM 8.6 08/24/2011 1430   CALCIUM 8.9 07/02/2010 0517   ALKPHOS 91 12/26/2012 1409   ALKPHOS 71 08/24/2011 1430   ALKPHOS 98 06/28/2010 0600   AST 8 12/26/2012 1409   AST 15 08/24/2011 1430   AST 14 06/28/2010 0600  ALT <6 Repeated and Verified 12/26/2012 1409   ALT 10 06/28/2010 0600   BILITOT 0.37 12/26/2012 1409   BILITOT 0.50 08/24/2011 1430   BILITOT 0.4 06/28/2010 0600       RADIOGRAPHIC STUDIES: Dg Chest 2 View  11/29/2012   *RADIOLOGY REPORT*  Clinical Data: COPD.  History of smoking.  Emphysema.  Shortness of breath.  CHEST - 2 VIEW  Comparison: CT 06/20/2012, plain film 09/22/2011  Findings: The patient's right-sided power port, tip to the superior vena cava.  Lungs are hyperinflated.  There is volume loss, postsurgical and postradiation change involving the left lung, similar in appearance to previous exams.  Patient is known to have a right lower lobe pulmonary nodule which is not well seen radiographically.  No new consolidations or pleural effusions are identified.  Marked of the sinus changes are identified in the right lung apex.  Patient has had previous spinal fusion.  IMPRESSION:  1.  Postoperative changes.  Post-treatment changes. 2.  No evidence for acute abnormality.   Original Report Authenticated By: Norva Pavlov, M.D.   Ct Chest W Contrast  12/26/2012   *RADIOLOGY REPORT*  Clinical Data: Lung cancer diagnosed 2011  CT CHEST WITH CONTRAST  Technique:  Multidetector CT imaging of the chest was performed following the standard protocol during bolus administration of intravenous contrast.  Contrast: 80mL OMNIPAQUE IOHEXOL 300 MG/ML  SOLN  Comparison: CT 06/20/2012, 03/17/2012 the  Findings: Interval increase in size of right lower lobe nodule which  now measures 22 x 16 mm increased from 10 x 10 mm on comparison  exams.  The nodule now ill-defined hazy margins suggesting infiltrative process.  There  is increased mild nodularity along the superior oblique fissure (image 28).  There is stable consolidation in the left upper lobe with volume loss and air bronchograms.  There is emphysema the right upper lobe.  No axillary or supraclavicular lymphadenopathy.  Small pericardial effusion is similar to prior.  No mediastinal lymphadenopathy.  Limited view of the upper abdomen demonstrates normal adrenal glands.  Limited view of the skeleton demonstrates posterior fusion.  No aggressive osseous lesions peri  IMPRESSION:  1.  Interval increase in size of right lower lobe pulmonary nodule is concerning for bronchogenic carcinoma. There is mild increased nodularity along the superior right oblique fissure.  2. Mild pleural nodularity along the superior right oblique fissure appears new.   Original Report Authenticated By: Genevive Bi, M.D.    ASSESSMENT AND PLAN: This is a very pleasant 62 years old white female with history of unresectable a stage IIb non-small cell lung cancer status post concurrent chemoradiation followed by consolidation chemotherapy. She is also status post curative radiotherapy to a right lower lobe lung nodule completed in October of 2013 but the patient has evidence for progression on the right lower lobe nodule which could be secondary to radiation fibrosis versus actual disease progression. I discussed the scan results and showed the images to the patient and her husband. I recommended for her to have a PET scan performed for further evaluation of this nodule. I would see her back for followup visit in 2 weeks for evaluation and discussion of her treatment options based on the PET scan results. She was advised to call immediately if she has any concerning symptoms in the interval.  All questions were answered. The patient knows to call the clinic with any problems, questions or concerns. We can  certainly see the patient much sooner if necessary.

## 2012-12-29 ENCOUNTER — Ambulatory Visit: Admission: RE | Admit: 2012-12-29 | Payer: Medicare HMO | Source: Ambulatory Visit | Admitting: Radiation Oncology

## 2012-12-29 ENCOUNTER — Telehealth: Payer: Self-pay | Admitting: *Deleted

## 2012-12-29 NOTE — Telephone Encounter (Signed)
Returned call to patient per Dr.Wentworth can reschedule her ap-p[t today to after her Pet Scan on July 9,2014, transferred call to [facility] to make new follow up appt,patient thanked MD 9:12 AM

## 2013-01-04 ENCOUNTER — Encounter (HOSPITAL_COMMUNITY)
Admission: RE | Admit: 2013-01-04 | Discharge: 2013-01-04 | Disposition: A | Discharge status: Home / Self Care | Payer: Medicare HMO | Source: Ambulatory Visit | Attending: Internal Medicine | Admitting: Internal Medicine

## 2013-01-04 DIAGNOSIS — C3490 Malignant neoplasm of unspecified part of unspecified bronchus or lung: Secondary | ICD-10-CM | POA: Insufficient documentation

## 2013-01-04 MED ORDER — FLUDEOXYGLUCOSE F - 18 (FDG) INJECTION
14.7000 | Freq: Once | INTRAVENOUS | Status: AC | PRN
  Administered 2013-01-04: 14.7 via INTRAVENOUS

## 2013-01-05 ENCOUNTER — Ambulatory Visit: Payer: Medicare HMO | Attending: Radiation Oncology | Admitting: Radiation Oncology

## 2013-01-11 ENCOUNTER — Ambulatory Visit (HOSPITAL_BASED_OUTPATIENT_CLINIC_OR_DEPARTMENT_OTHER): Payer: Medicare HMO | Admitting: Internal Medicine

## 2013-01-11 ENCOUNTER — Encounter: Payer: Self-pay | Admitting: Internal Medicine

## 2013-01-11 ENCOUNTER — Telehealth: Payer: Self-pay | Admitting: Dietician

## 2013-01-11 ENCOUNTER — Telehealth: Payer: Self-pay | Admitting: Internal Medicine

## 2013-01-11 VITALS — BP 109/67 | HR 90 | Temp 97.2°F | Resp 20 | Ht 63.0 in | Wt 150.9 lb

## 2013-01-11 DIAGNOSIS — C3490 Malignant neoplasm of unspecified part of unspecified bronchus or lung: Secondary | ICD-10-CM

## 2013-01-11 NOTE — Patient Instructions (Signed)
The PET scan showed no significant activity in the right lower lobe nodule. Continue on observation with repeat CT scan of the chest in 6 months

## 2013-01-11 NOTE — Telephone Encounter (Signed)
gv and printed appt sched and avs forl pt. °

## 2013-01-11 NOTE — Progress Notes (Signed)
[facility] Telephone:([phone]   [phone]  OFFICE PROGRESS NOTE  Maria Blamer, MD [facility] [address] [place] [address]  PRINCIPAL DIAGNOSIS: Unresectable stage IIB (T3 N0 M0) non-small cell lung cancer, adenocarcinoma diagnosed in June 2011.   PRIOR THERAPY:  1. Status post concurrent chemoradiation with weekly carboplatin and paclitaxel. Last dose was given February 24, 2010. 2. Consolidation chemotherapy with carboplatin and Alimta, status post 3 cycles. Last dose was given June 09, 2010. 3. Curative radiotherapy to a right lower lobe nodule under the care of Dr. Michell Heinrich completed 04/20/2012.  CURRENT THERAPY: Observation.  INTERVAL HISTORY: Maria Lucero 62 y.o. female returns to the clinic today for followup visit accompanied her husband. The patient is feeling fine today with no specific complaints except for the baseline shortness of breath and she is currently on home oxygen. She denied having any significant chest pain or hemoptysis. The patient denied having any significant weight loss or night sweats. She had a PET scan performed recently and she is here for evaluation and discussion of her PET scan results.  MEDICAL HISTORY: Past Medical History  Diagnosis Date  . lung ca dx'd 11/2009    chemo comp 05/2010  . Hypertension   . Migraine headache   . Anxiety   . Urticaria   . Low back pain syndrome   . Hyperlipidemia   . Hypercholesterolemia   . COPD (chronic obstructive pulmonary disease)   . Depression   . Tobacco abuse   . Myalgia and myositis, unspecified   . Lung cancer     invasive left upper lobe adenocarcinoma  . Bipolar 1 disorder   . Allergy   . Asthma     per pt stated  . Blood transfusion     several x  . Cataract     recent surgery b/l  . Arthritis   . History of radiation therapy 04/11/12; 04/13/12; 04/15/12; 04/18/12; & 04/20/12    Rll lung 60Gy/5/fx     ALLERGIES:  is allergic to methocarbamol; morphine sulfate er beads; aspirin; clarithromycin; codeine; and morphine.  MEDICATIONS:  Current Outpatient Prescriptions  Medication Sig Dispense Refill  . albuterol (PROVENTIL) (2.5 MG/3ML) 0.083% nebulizer solution Take 2.5 mg by nebulization every 6 (six) hours as needed for wheezing.      Marland Kitchen buPROPion (WELLBUTRIN) 100 MG tablet Take 100 mg by mouth 2 (two) times daily.      . clonazePAM (KLONOPIN) 1 MG tablet Take 1-2 mg by mouth 3 (three) times daily.       . cyclobenzaprine (FLEXERIL) 10 MG tablet Take 1 tablet (10 mg total) by mouth 2 (two) times daily as needed for muscle spasms.  20 tablet  0  . divalproex (DEPAKOTE) 250 MG DR tablet Take 750 mg by mouth at bedtime.      Marland Kitchen doxycycline (VIBRAMYCIN) 100 MG capsule Take 1 capsule by mouth 2 (two) times daily.      . DULoxetine (CYMBALTA) 60 MG capsule Take 60 mg by mouth daily before breakfast.       . folic acid (FOLVITE) 1 MG tablet Take 1 mg by mouth daily.      Marland Kitchen HYDROcodone-acetaminophen (NORCO/VICODIN) 5-325 MG per tablet Take 2 tablets by mouth every 4 (four) hours as needed for pain.  6 tablet  0  . hydroxychloroquine (PLAQUENIL) 200 MG tablet Take 400 mg by mouth daily.       Marland Kitchen omeprazole (PRILOSEC)  20 MG capsule Take 20 mg by mouth daily.        Marland Kitchen oxyCODONE-acetaminophen (PERCOCET) 10-325 MG per tablet Take 1 tablet by mouth every 4 (four) hours as needed. Pain       . promethazine (PHENERGAN) 25 MG tablet Take 1 tablet (25 mg total) by mouth every 6 (six) hours as needed for nausea.  12 tablet  0  . QUEtiapine (SEROQUEL) 25 MG tablet Take 25 mg by mouth at bedtime.        . simvastatin (ZOCOR) 40 MG tablet Take 1 tablet by mouth daily.      . SUMAtriptan (IMITREX) 100 MG tablet Take 100 mg by mouth every 2 (two) hours as needed. Headache       . traMADol (ULTRAM) 50 MG tablet Take 50 mg by mouth every 6 (six) hours as needed for pain.      Marland Kitchen triamcinolone cream (KENALOG) 0.1 %        . [DISCONTINUED] lisinopril-hydrochlorothiazide (PRINZIDE,ZESTORETIC) 10-12.5 MG per tablet Take 1 tablet by mouth.        No current facility-administered medications for this visit.    SURGICAL HISTORY:  Past Surgical History  Procedure Laterality Date  . Back surgery      x 4 L3-S1 1996,2000, 2002  . Vesicovaginal fistula closure w/ tah    . Appendectomy    . Bronchoscopy  2011  . Lung biopsy  2011    needle  . Tonsillectomy    . Abdominal hysterectomy      with b/l oophoorectomy age 12    REVIEW OF SYSTEMS:  A comprehensive review of systems was negative except for: Constitutional: positive for fatigue Respiratory: positive for dyspnea on exertion   PHYSICAL EXAMINATION: General appearance: alert, cooperative, fatigued and no distress Head: Normocephalic, without obvious abnormality, atraumatic Neck: no adenopathy Lymph nodes: Cervical, supraclavicular, and axillary nodes normal. Resp: clear to auscultation bilaterally Cardio: regular rate and rhythm, S1, S2 normal, no murmur, click, rub or gallop GI: soft, non-tender; bowel sounds normal; no masses,  no organomegaly Extremities: extremities normal, atraumatic, no cyanosis or edema  ECOG PERFORMANCE STATUS: 1 - Symptomatic but completely ambulatory  Blood pressure 109/67, pulse 90, temperature 97.2 F (36.2 C), temperature source Oral, resp. rate 20, height 5\' 3"  (1.6 m), weight 150 lb 14.4 oz (68.448 kg).  LABORATORY DATA: Lab Results  Component Value Date   WBC 6.9 12/26/2012   HGB 11.9 12/26/2012   HCT 34.9 12/26/2012   MCV 92.0 12/26/2012   PLT 453* 12/26/2012      Chemistry      Component Value Date/Time   NA 136 12/26/2012 1409   NA 145 08/24/2011 1430   NA 140 07/02/2010 0517   K 4.2 12/26/2012 1409   K 4.3 08/24/2011 1430   K 3.9 DELTA CHECK NOTED 07/02/2010 0517   CL 101 06/20/2012 1119   CL 95* 08/24/2011 1430   CL 103 07/02/2010 0517   CO2 29 12/26/2012 1409   CO2 31 08/24/2011 1430   CO2 28 07/02/2010  0517   BUN 10.5 12/26/2012 1409   BUN 15 08/24/2011 1430   BUN 10 07/02/2010 0517   CREATININE 0.8 12/26/2012 1409   CREATININE 0.8 08/24/2011 1430   CREATININE 0.70 07/02/2010 0517      Component Value Date/Time   CALCIUM 10.1 12/26/2012 1409   CALCIUM 8.6 08/24/2011 1430   CALCIUM 8.9 07/02/2010 0517   ALKPHOS 91 12/26/2012 1409   ALKPHOS 71 08/24/2011 1430  ALKPHOS 98 06/28/2010 0600   AST 8 12/26/2012 1409   AST 15 08/24/2011 1430   AST 14 06/28/2010 0600   ALT <6 Repeated and Verified 12/26/2012 1409   ALT 10 06/28/2010 0600   BILITOT 0.37 12/26/2012 1409   BILITOT 0.50 08/24/2011 1430   BILITOT 0.4 06/28/2010 0600       RADIOGRAPHIC STUDIES: Ct Chest W Contrast  12/26/2012   *RADIOLOGY REPORT*  Clinical Data: Lung cancer diagnosed 2011  CT CHEST WITH CONTRAST  Technique:  Multidetector CT imaging of the chest was performed following the standard protocol during bolus administration of intravenous contrast.  Contrast: 80mL OMNIPAQUE IOHEXOL 300 MG/ML  SOLN  Comparison: CT 06/20/2012, 03/17/2012 the  Findings: Interval increase in size of right lower lobe nodule which  now measures 22 x 16 mm increased from 10 x 10 mm on comparison exams.  The nodule now ill-defined hazy margins suggesting infiltrative process.  There  is increased mild nodularity along the superior oblique fissure (image 28).  There is stable consolidation in the left upper lobe with volume loss and air bronchograms.  There is emphysema the right upper lobe.  No axillary or supraclavicular lymphadenopathy.  Small pericardial effusion is similar to prior.  No mediastinal lymphadenopathy.  Limited view of the upper abdomen demonstrates normal adrenal glands.  Limited view of the skeleton demonstrates posterior fusion.  No aggressive osseous lesions peri  IMPRESSION:  1.  Interval increase in size of right lower lobe pulmonary nodule is concerning for bronchogenic carcinoma. There is mild increased nodularity along the superior right  oblique fissure.  2. Mild pleural nodularity along the superior right oblique fissure appears new.   Original Report Authenticated By: Genevive Bi, M.D.   Nm Pet Image Restag (ps) Skull Base To Thigh  01/04/2013   *RADIOLOGY REPORT*  Clinical Data: Subsequent treatment strategy for non-small cell carcinoma, adenocarcinoma type diagnosed June 2011.  Chemotherapy and radiation therapy.  2011.  Radiotherapy to right lower lobe lung nodule 04/20/2012.  Currently on observation.  NUCLEAR MEDICINE PET SKULL BASE TO THIGH  Fasting Blood Glucose:  67  Technique:  14.7 mCi F-18 FDG was injected intravenously. CT data was obtained and used for attenuation correction and anatomic localization only.  (This was not acquired as a diagnostic CT examination.) Additional exam technical data entered on technologist worksheet.  Comparison:  Chest CT 12/26/2012 and PET 09/08/2011  Findings:  Neck: No abnormal hypermetabolism.  Chest:  Moderate heterogeneous hypermetabolism corresponding to radiation changes in the left upper lobe.  No focal abnormality in this area to suggest locally recurrent or residual disease.  Low level, nonmalignant range hypermetabolism corresponds to the right lower lobe nodular opacity. On the order of a S.U.V. max of 2.2. This appears less solid and more ground-glass in morphology on image 99/series 2 today.  This is likely due to differences in slice selection.  A focus of hypermetabolism projects at the distal aspect of the right upper lobe bronchus. Example image 87/series 2.There is no correlate node or endobronchial/pulmonary lesion in this area today or on the recent diagnostic CT.  No evidence of misregistration. Nodal tissue just inferior the right hilum measures 1.3 cm, upper normal in size.  Abdomen/Pelvis:  No abnormal hypermetabolism.  Skeleton:  Probable greater trochanteric bursitis with hypermetabolism superficial the greater trochanters bilaterally.  CT  images performed for attenuation  correction demonstrate no significant findings within the neck.  Chest findings deferred to recent diagnostic CT.  Small pericardial effusion.  Moderate  centrilobular emphysema.  Extensive lumbar spine fixation.  Hysterectomy.  Pelvic floor laxity.  IMPRESSION:  1.  The right lower lobe opacity described on the prior CT demonstrates low level, nonmalignant range hypermetabolism and appears less solid and more ground-glass in morphology today. Favored to represent evolving radiation change. 2.  Unusual focus of medial right chest hypermetabolism, appearing to localize at the lateral aspect of the right upper lobe bronchus. No correlate pulmonary or endobronchial lesion in this area. Considerations include misregistration of the adjacent right hilar nodal tissue(although no other misregistration is seen), an occult endobronchial lesion, or iatrogenic FDG tracer embolization to the peribronchial right upper lobe.  Consider short-term contrast enhanced CT follow-up at 2 - 3 months with special attention to this area. 3.  Presumed evolving radiation changes in the left upper lobe.   Original Report Authenticated By: Jeronimo Greaves, M.D.    ASSESSMENT AND PLAN: This is a very pleasant 62 years old white female with unresectable stage IIb non-small cell lung cancer status post concurrent chemoradiation followed by consolidation chemotherapy and has been observation with no significant evidence for disease progression. The PET scan results and showed the right lower lobe opacity with low FDG activity consistent with evolving radiation changes. I discussed the scan results with the patient and her husband. I recommended for her to continue on observation with repeat CT scan of the chest in 6 months.  The patient was advised to call immediately she has any concerning symptoms in the interval.  All questions were answered. The patient knows to call the clinic with any problems, questions or concerns. We can certainly see  the patient much sooner if necessary.

## 2013-01-11 NOTE — Telephone Encounter (Signed)
Brief Outpatient Oncology Nutrition Note  Patient has been identified to be at risk on malnutrition screen.  Wt Readings from Last 10 Encounters:  01/11/13 150 lb 14.4 oz (68.448 kg)  12/28/12 146 lb 12.8 oz (66.588 kg)  11/29/12 145 lb 6.4 oz (65.953 kg)  06/27/12 151 lb (68.493 kg)  05/31/12 153 lb 9.6 oz (69.673 kg)  05/20/12 154 lb (69.854 kg)  04/13/12 154 lb (69.854 kg)  03/21/12 158 lb 9.6 oz (71.94 kg)  11/26/11 168 lb 9.6 oz (76.476 kg)  09/24/11 158 lb 4.8 oz (71.804 kg)   Called patient who was unavailable at this time.  Message left with contact information for [facility].  Oran Rein, RD, LDN

## 2013-01-19 ENCOUNTER — Other Ambulatory Visit: Payer: Self-pay | Admitting: Radiation Oncology

## 2013-02-02 ENCOUNTER — Ambulatory Visit: Admission: RE | Admit: 2013-02-02 | Payer: Medicare HMO | Source: Ambulatory Visit | Admitting: Radiation Oncology

## 2013-03-29 ENCOUNTER — Telehealth: Payer: Self-pay | Admitting: Medical Oncology

## 2013-03-29 NOTE — Telephone Encounter (Signed)
Pt saw Dr Lendon Colonel last month for an arthritis check and her back pain . Pt said she had an xray that showed  "scoliosis  burn't places from the radiaiton" .  Dr Lendon Colonel is treating her with Flexeril and duloxetine. She is not taking the flexeril so I told her to take it.  Today she feels better.  Dr Lendon Colonel wanted her to notify Dr Arbutus Ped of findings. Note to Dr Arbutus Ped

## 2013-06-05 ENCOUNTER — Ambulatory Visit: Payer: Medicare HMO | Admitting: Internal Medicine

## 2013-06-11 ENCOUNTER — Emergency Department (HOSPITAL_COMMUNITY)
Admission: EM | Admit: 2013-06-11 | Discharge: 2013-06-11 | Disposition: A | Discharge status: Home / Self Care | Payer: Medicare HMO | Attending: Emergency Medicine | Admitting: Emergency Medicine

## 2013-06-11 ENCOUNTER — Emergency Department (HOSPITAL_COMMUNITY): Payer: Medicare HMO

## 2013-06-11 ENCOUNTER — Encounter (HOSPITAL_COMMUNITY): Payer: Self-pay | Admitting: Emergency Medicine

## 2013-06-11 DIAGNOSIS — K219 Gastro-esophageal reflux disease without esophagitis: Secondary | ICD-10-CM | POA: Insufficient documentation

## 2013-06-11 DIAGNOSIS — Z87891 Personal history of nicotine dependence: Secondary | ICD-10-CM | POA: Insufficient documentation

## 2013-06-11 DIAGNOSIS — F319 Bipolar disorder, unspecified: Secondary | ICD-10-CM | POA: Insufficient documentation

## 2013-06-11 DIAGNOSIS — Z872 Personal history of diseases of the skin and subcutaneous tissue: Secondary | ICD-10-CM | POA: Insufficient documentation

## 2013-06-11 DIAGNOSIS — S42213A Unspecified displaced fracture of surgical neck of unspecified humerus, initial encounter for closed fracture: Secondary | ICD-10-CM | POA: Insufficient documentation

## 2013-06-11 DIAGNOSIS — IMO0001 Reserved for inherently not codable concepts without codable children: Secondary | ICD-10-CM | POA: Insufficient documentation

## 2013-06-11 DIAGNOSIS — Y929 Unspecified place or not applicable: Secondary | ICD-10-CM | POA: Insufficient documentation

## 2013-06-11 DIAGNOSIS — G43909 Migraine, unspecified, not intractable, without status migrainosus: Secondary | ICD-10-CM | POA: Insufficient documentation

## 2013-06-11 DIAGNOSIS — Z85118 Personal history of other malignant neoplasm of bronchus and lung: Secondary | ICD-10-CM | POA: Insufficient documentation

## 2013-06-11 DIAGNOSIS — M129 Arthropathy, unspecified: Secondary | ICD-10-CM | POA: Insufficient documentation

## 2013-06-11 DIAGNOSIS — Z792 Long term (current) use of antibiotics: Secondary | ICD-10-CM | POA: Insufficient documentation

## 2013-06-11 DIAGNOSIS — S42302A Unspecified fracture of shaft of humerus, left arm, initial encounter for closed fracture: Secondary | ICD-10-CM

## 2013-06-11 DIAGNOSIS — R296 Repeated falls: Secondary | ICD-10-CM | POA: Insufficient documentation

## 2013-06-11 DIAGNOSIS — J4489 Other specified chronic obstructive pulmonary disease: Secondary | ICD-10-CM | POA: Insufficient documentation

## 2013-06-11 DIAGNOSIS — Y9389 Activity, other specified: Secondary | ICD-10-CM | POA: Insufficient documentation

## 2013-06-11 DIAGNOSIS — Z79899 Other long term (current) drug therapy: Secondary | ICD-10-CM | POA: Insufficient documentation

## 2013-06-11 DIAGNOSIS — E785 Hyperlipidemia, unspecified: Secondary | ICD-10-CM | POA: Insufficient documentation

## 2013-06-11 DIAGNOSIS — J449 Chronic obstructive pulmonary disease, unspecified: Secondary | ICD-10-CM | POA: Insufficient documentation

## 2013-06-11 DIAGNOSIS — F411 Generalized anxiety disorder: Secondary | ICD-10-CM | POA: Insufficient documentation

## 2013-06-11 MED ORDER — HEPARIN SOD (PORK) LOCK FLUSH 100 UNIT/ML IV SOLN
500.0000 [IU] | Freq: Once | INTRAVENOUS | Status: AC
  Administered 2013-06-11: 500 [IU]
  Filled 2013-06-11: qty 5

## 2013-06-11 MED ORDER — OXYCODONE-ACETAMINOPHEN 10-325 MG PO TABS: 1.0000 | ORAL_TABLET | ORAL | Status: DC | PRN

## 2013-06-11 MED ORDER — HYDROMORPHONE HCL PF 1 MG/ML IJ SOLN
1.0000 mg | Freq: Once | INTRAMUSCULAR | Status: AC
  Administered 2013-06-11: 1 mg via INTRAMUSCULAR
  Filled 2013-06-11: qty 1

## 2013-06-11 NOTE — ED Notes (Signed)
Pt returned from xray

## 2013-06-11 NOTE — ED Notes (Signed)
Patient transported to X-ray 

## 2013-06-11 NOTE — Progress Notes (Signed)
Orthopedic Tech Progress Note Patient Details:  Maria Lucero 1950/11/29 [id number]  Ortho Devices Type of Ortho Device: Shoulder immobilizer Ortho Device/Splint Location: LUE Ortho Device/Splint Interventions: Ordered;Application   [name] 06/11/2013, 4:25 PM

## 2013-06-11 NOTE — ED Notes (Signed)
Port flushed with Heparin per protacol.

## 2013-06-11 NOTE — ED Notes (Signed)
Family at bedside. 

## 2013-06-11 NOTE — ED Notes (Signed)
MD at bedside for assessment

## 2013-06-11 NOTE — ED Notes (Addendum)
IV team paged.  

## 2013-06-11 NOTE — ED Notes (Signed)
Pt reports fell while trying to get into a chair. Pt c/o pain to right arm pain. Pt denies head injury.

## 2013-06-11 NOTE — ED Provider Notes (Signed)
CSN: [id number]     Arrival date & time 06/11/13  1223 History   First MD Initiated Contact with Patient 06/11/13 1236     Chief Complaint  Patient presents with  . Fall  . Arm Pain   (Consider location/radiation/quality/duration/timing/severity/associated sxs/prior Treatment) HPI Patient presents after a mechanical fall with pain in her left shoulder. Patient fell while trying to get into a chair. Patient does have multiple medical problems, including chronic pain from back injuries/surgery. Since the event there's been pain focally about the left proximal arm. Pain is severe, sore, worse with any attempted motion. No attempts at relief with anything thus far. No head trauma, no neck pain, no head pain, no confusion, no disorientation.  Past Medical History  Diagnosis Date  . lung ca dx'd 11/2009    chemo comp 05/2010  . Hypertension   . Migraine headache   . Anxiety   . Urticaria   . Low back pain syndrome   . Hyperlipidemia   . Hypercholesterolemia   . COPD (chronic obstructive pulmonary disease)   . Depression   . Tobacco abuse   . Myalgia and myositis, unspecified   . Lung cancer     invasive left upper lobe adenocarcinoma  . Bipolar 1 disorder   . Allergy   . Asthma     per pt stated  . Blood transfusion     several x  . Cataract     recent surgery b/l  . Arthritis   . History of radiation therapy 04/11/12; 04/13/12; 04/15/12; 04/18/12; & 04/20/12    Rll lung 60Gy/5/fx   Past Surgical History  Procedure Laterality Date  . Back surgery      x 4 L3-S1 1996,2000, 2002  . Vesicovaginal fistula closure w/ tah    . Appendectomy    . Bronchoscopy  2011  . Lung biopsy  2011    needle  . Tonsillectomy    . Abdominal hysterectomy      with b/l oophoorectomy age 50   Family History  Problem Relation Age of Onset  . Heart disease      paternal side  . Rheum arthritis Mother   . Lung cancer    . Colon cancer    . Brain cancer    . Kidney cancer Maternal  Grandmother    History  Substance Use Topics  . Smoking status: Former Smoker -- 1.00 packs/day for 51 years    Types: Cigarettes    Quit date: 06/29/2008  . Smokeless tobacco: Not on file     Comment: Electronic cig-no nicotine-vapor  . Alcohol Use: No   OB History   Grav Para Term Preterm Abortions TAB SAB Ect Mult Living                 Review of Systems  All other systems reviewed and are negative.    Allergies  Methocarbamol; Morphine sulfate er beads; Aspirin; Clarithromycin; Codeine; and Morphine  Home Medications   Current Outpatient Rx  Name  Route  Sig  Dispense  Refill  . albuterol (PROVENTIL) (2.5 MG/3ML) 0.083% nebulizer solution   Nebulization   Take 2.5 mg by nebulization every 6 (six) hours as needed for wheezing.         Marland Kitchen buPROPion (WELLBUTRIN) 100 MG tablet   Oral   Take 100 mg by mouth 2 (two) times daily.         . clonazePAM (KLONOPIN) 1 MG tablet   Oral   Take 1-2 mg  by mouth 3 (three) times daily.          . cyclobenzaprine (FLEXERIL) 10 MG tablet   Oral   Take 1 tablet (10 mg total) by mouth 2 (two) times daily as needed for muscle spasms.   20 tablet   0   . divalproex (DEPAKOTE) 250 MG DR tablet   Oral   Take 750 mg by mouth at bedtime.         Marland Kitchen doxycycline (VIBRAMYCIN) 100 MG capsule   Oral   Take 1 capsule by mouth 2 (two) times daily.         . DULoxetine (CYMBALTA) 60 MG capsule   Oral   Take 60 mg by mouth daily before breakfast.          . folic acid (FOLVITE) 1 MG tablet   Oral   Take 1 mg by mouth daily.         Marland Kitchen HYDROcodone-acetaminophen (NORCO/VICODIN) 5-325 MG per tablet   Oral   Take 2 tablets by mouth every 4 (four) hours as needed for pain.   6 tablet   0   . hydroxychloroquine (PLAQUENIL) 200 MG tablet   Oral   Take 400 mg by mouth daily.          Marland Kitchen omeprazole (PRILOSEC) 20 MG capsule   Oral   Take 20 mg by mouth daily.           Marland Kitchen oxyCODONE-acetaminophen (PERCOCET) 10-325 MG per  tablet   Oral   Take 1 tablet by mouth every 4 (four) hours as needed. Pain          . QUEtiapine (SEROQUEL) 25 MG tablet   Oral   Take 25 mg by mouth at bedtime.           . simvastatin (ZOCOR) 40 MG tablet   Oral   Take 1 tablet by mouth daily.         . SUMAtriptan (IMITREX) 100 MG tablet   Oral   Take 100 mg by mouth every 2 (two) hours as needed. Headache          . traMADol (ULTRAM) 50 MG tablet   Oral   Take 50 mg by mouth every 6 (six) hours as needed for pain.         Marland Kitchen triamcinolone cream (KENALOG) 0.1 %                BP 155/92  Pulse 88  Temp(Src) 97.6 F (36.4 C) (Rectal)  Resp 24  Ht 5\' 2"  (1.575 m)  Wt 148 lb (67.132 kg)  BMI 27.06 kg/m2  SpO2 99% Physical Exam  Nursing note and vitals reviewed. Constitutional: She is oriented to person, place, and time. She appears well-developed and well-nourished. No distress.  HENT:  Head: Normocephalic and atraumatic.  Eyes: Conjunctivae and EOM are normal.  Cardiovascular: Normal rate and regular rhythm.   Pulmonary/Chest: Effort normal and breath sounds normal. No stridor. No respiratory distress.  Abdominal: She exhibits no distension.  Musculoskeletal: She exhibits no edema.       Right shoulder: Normal.       Left shoulder: She exhibits decreased range of motion, tenderness, bony tenderness, swelling, deformity and pain. She exhibits no laceration, no spasm and normal pulse.       Right elbow: Normal.      Right wrist: Normal.       Arms: Neurological: She is alert and oriented to person, place, and time. No  cranial nerve deficit.  Skin: Skin is warm and dry.  Psychiatric: She has a normal mood and affect.    ED Course  Procedures (including critical care time) Labs Review Labs Reviewed - No data to display Imaging Review No results found.  EKG Interpretation   None      Pulse oximetry 100% room air normal  After the initial evaluation I reviewed the patient's chart.   I  reviewed the patient's x-ray, which is notably abnormal.  Subsequently I discussed it with the patient, demonstrated to the patient and her husband. Discussed patient's case with our orthopedist on call.  The patient will have sling immobilization, CT scan prior to discharge, then will follow up with him in the office in the morning.  MDM  No diagnosis found. This patient presents with a mechanical fall pain in her left arm.  She is distally neurovascularly intact, but has a notable deformity at the proximal left upper arm.  Patient's evaluation demonstrates displaced left humeral fracture.  The patient will likely have surgical repair, but is appropriate for discharge with next a followup with orthopedics for this intervention.  She tolerated analgesia provision here and immobilization here well.      Gerhard Munch, MD 06/11/13 [phone]

## 2013-06-11 NOTE — ED Notes (Signed)
Patient transported to CT 

## 2013-06-13 ENCOUNTER — Encounter (HOSPITAL_BASED_OUTPATIENT_CLINIC_OR_DEPARTMENT_OTHER): Payer: Self-pay | Admitting: *Deleted

## 2013-06-14 ENCOUNTER — Encounter (HOSPITAL_COMMUNITY): Payer: Self-pay | Admitting: Pharmacy Technician

## 2013-06-14 ENCOUNTER — Other Ambulatory Visit: Payer: Self-pay | Admitting: Orthopedic Surgery

## 2013-06-15 ENCOUNTER — Telehealth: Payer: Self-pay | Admitting: Internal Medicine

## 2013-06-15 ENCOUNTER — Encounter (HOSPITAL_COMMUNITY)
Admission: RE | Admit: 2013-06-15 | Discharge: 2013-06-15 | Disposition: A | Discharge status: Home / Self Care | Payer: Medicare HMO | Source: Ambulatory Visit | Attending: Orthopedic Surgery | Admitting: Orthopedic Surgery

## 2013-06-15 ENCOUNTER — Encounter (HOSPITAL_COMMUNITY): Payer: Self-pay

## 2013-06-15 ENCOUNTER — Ambulatory Visit (HOSPITAL_COMMUNITY)
Admission: RE | Admit: 2013-06-15 | Discharge: 2013-06-15 | Disposition: A | Discharge status: Home / Self Care | Payer: Medicare HMO | Source: Ambulatory Visit | Attending: Orthopedic Surgery | Admitting: Orthopedic Surgery

## 2013-06-15 HISTORY — DX: Other specified postprocedural states: R11.2

## 2013-06-15 HISTORY — DX: Other specified postprocedural states: Z98.890

## 2013-06-15 HISTORY — DX: Adverse effect of unspecified anesthetic, initial encounter: T41.45XA

## 2013-06-15 HISTORY — DX: Other complications of anesthesia, initial encounter: T88.59XA

## 2013-06-15 LAB — CBC
HCT: 31.2 % — ABNORMAL LOW (ref 36.0–46.0)
Platelets: 273 10*3/uL (ref 150–400)
RBC: 3.35 MIL/uL — ABNORMAL LOW (ref 3.87–5.11)
RDW: 14.1 % (ref 11.5–15.5)
WBC: 9 10*3/uL (ref 4.0–10.5)

## 2013-06-15 LAB — BASIC METABOLIC PANEL
BUN: 10 mg/dL (ref 6–23)
Chloride: 95 mEq/L — ABNORMAL LOW (ref 96–112)
Creatinine, Ser: 0.66 mg/dL (ref 0.50–1.10)
GFR calc Af Amer: >90 mL/min (ref >90)
Potassium: 3.8 mEq/L (ref 3.5–5.1)
Sodium: 131 mEq/L — ABNORMAL LOW (ref 135–145)

## 2013-06-15 MED ORDER — LACTATED RINGERS IV SOLN
INTRAVENOUS | Status: DC
  Administered 2013-06-16: 11:00:00 via INTRAVENOUS

## 2013-06-15 MED ORDER — CEFAZOLIN SODIUM-DEXTROSE 2-3 GM-% IV SOLR
2.0000 g | INTRAVENOUS | Status: AC
  Administered 2013-06-16: 2 g via INTRAVENOUS
  Filled 2013-06-15: qty 50

## 2013-06-15 MED ORDER — MIDAZOLAM HCL 2 MG/2ML IJ SOLN
1.0000 mg | INTRAMUSCULAR | Status: DC | PRN
  Filled 2013-06-15: qty 2

## 2013-06-15 MED ORDER — FENTANYL CITRATE 0.05 MG/ML IJ SOLN
50.0000 ug | INTRAMUSCULAR | Status: DC | PRN
  Administered 2013-06-16: 50 ug via INTRAVENOUS
  Filled 2013-06-15: qty 2

## 2013-06-15 NOTE — Telephone Encounter (Signed)
This was faxed on 06/13/13. Spoke with [name].  She is requesting we refax this.  This has been done.  [name] is aware and form placed in scan folder.

## 2013-06-15 NOTE — Progress Notes (Signed)
Abnl EKG reviewed by Revonda Standard, PA; ok for surgery

## 2013-06-15 NOTE — Progress Notes (Addendum)
Anesthesia Chart Review:  Patient is a 62 year old female scheduled for ORIF left proximal humerus fracture tomorrow by Dr. [name].  History includes former smoker, unresectable stage IIB NSC lung cancer (LUL) s/p chemoradiation '11 with radiotherapy '13 for RLL nodule, COPD, home oxygen, migraines, anxiety, depression, Bipolar disorder, GERD, HLD, hysterectomy, lumbar fusion, post-operative N/V. Oncologist is Dr. Arbutus Ped. Pulmonologist is Dr. Jetty Duhamel.   Echo on 05/20/10 showed: - Left ventricle: The cavity size was normal. Systolic function was normal. The estimated ejection fraction was in the range of 60% to 65%. Wall motion was normal; there were no regional wall motion abnormalities. - Pulmonary arteries: PA peak pressure: 33mm Hg (S). - Tricuspid valve: Mild regurgitation.  EKG on 06/15/13 showed NSR, cannot rule out anterior infarct (age undetermined).  T wave inversion in V2 was no longer present when compared to prior EKG on 06/28/10.  She denied chest pain at her PAT visit.  CXR on 06/15/13 showed: Diffuse chronic changes bilaterally. No acute intrathoracic abnormality is noted. Left humeral fracture.  Preoperative labs noted.  Plan to recheck her glucose since she was hypoglycemic at PAT. (She will be NPO and is an afternoon case.)  She does have known unresectable lung cancer and remains on home oxygen.  Now with need for ORIF of humerus fracture. No acute cardiopulmonary symptoms per her PAT RN.  Vitals and CXR are stable. Further evaluation by her assigned anesthesiologist on the day of surgery. If no acute changes then would anticipate that she could proceed as planned but with increased pulmonary risk.  May need to consider pulmonary consult during her hospitalization. (Update 5:55 PM: Fax recieved for pulmonary clearance from Dr. Maple Hudson stating she is high risk for necessary surgery and that pulmonary can see post-operatively if needed.)   Velna Ochs [facility] Short Stay  Center/Anesthesiology Phone [phone] 06/15/2013 4:48 PM

## 2013-06-15 NOTE — Pre-Procedure Instructions (Signed)
MARIADEL MRUK  06/15/2013   Your procedure is scheduled on:  Friday, December 19 at 1230 pm   Report to [facility] Stay ,Main entrance "A" at 1030 AM.   Call this number if you have problems the morning of surgery: [phone]   Remember:   Do not eat food or drink liquids after midnight.tonight   Take these medicines the morning of surgery with A SIP OF WATER: clonazepam (Klonopin),doxycycline,Duloxetine (Cymbalta),hydroxychloroquine(Plaquenil),omeprazole(Prilosec),oxycodone or tramadol   as needed   Do not wear jewelry, make-up or nail polish.  Do not wear lotions, powders, or perfumes. You may wear deodorant.  Do not shave 48 hours prior to surgery. Men may shave face and neck.  Do not bring valuables to the hospital.  [facility] is not responsible  for any belongings or valuables.               Contacts, dentures or bridgework may not be worn into surgery.  Leave suitcase in the car. After surgery it may be brought to your room.  For patients admitted to the hospital, discharge time is determined by your  treatment team.               Patients discharged the day of surgery will not be allowed to drive  home.  Name and phone number of your driver: family or friend  Special Instructions: Shower using CHG 2 nights before surgery and the night before surgery.  If you shower the day of surgery use CHG.  Use special wash - you have one bottle of CHG for all showers.  You should use approximately 1/3 of the bottle for each shower.   Please read over the following fact sheets that you were given: Pain Booklet, Coughing and Deep Breathing and Surgical Site Infection Prevention

## 2013-06-16 ENCOUNTER — Encounter (HOSPITAL_COMMUNITY): Payer: Self-pay | Admitting: *Deleted

## 2013-06-16 ENCOUNTER — Observation Stay (HOSPITAL_COMMUNITY): Payer: Medicare HMO

## 2013-06-16 ENCOUNTER — Encounter (HOSPITAL_COMMUNITY)
Admission: RE | Disposition: A | Discharge status: Home / Self Care | Payer: Self-pay | Source: Ambulatory Visit | Attending: Orthopedic Surgery

## 2013-06-16 ENCOUNTER — Observation Stay (HOSPITAL_BASED_OUTPATIENT_CLINIC_OR_DEPARTMENT_OTHER)
Admission: RE | Admit: 2013-06-16 | Discharge: 2013-06-17 | Disposition: A | Discharge status: Home / Self Care | Payer: Medicare HMO | Source: Ambulatory Visit | Attending: Orthopedic Surgery | Admitting: Orthopedic Surgery

## 2013-06-16 ENCOUNTER — Ambulatory Visit (HOSPITAL_COMMUNITY): Payer: Medicare HMO

## 2013-06-16 ENCOUNTER — Ambulatory Visit (HOSPITAL_COMMUNITY): Payer: Medicare HMO | Admitting: Anesthesiology

## 2013-06-16 ENCOUNTER — Encounter (HOSPITAL_COMMUNITY)
Admission: RE | Disposition: A | Discharge status: Home / Self Care | Payer: Commercial Managed Care - HMO | Source: Ambulatory Visit | Attending: Orthopedic Surgery

## 2013-06-16 ENCOUNTER — Encounter (HOSPITAL_COMMUNITY): Payer: Medicare HMO | Admitting: Vascular Surgery

## 2013-06-16 DIAGNOSIS — Z87891 Personal history of nicotine dependence: Secondary | ICD-10-CM | POA: Insufficient documentation

## 2013-06-16 DIAGNOSIS — S42213A Unspecified displaced fracture of surgical neck of unspecified humerus, initial encounter for closed fracture: Principal | ICD-10-CM | POA: Insufficient documentation

## 2013-06-16 DIAGNOSIS — Z01812 Encounter for preprocedural laboratory examination: Secondary | ICD-10-CM | POA: Insufficient documentation

## 2013-06-16 DIAGNOSIS — Z923 Personal history of irradiation: Secondary | ICD-10-CM | POA: Insufficient documentation

## 2013-06-16 DIAGNOSIS — Z9981 Dependence on supplemental oxygen: Secondary | ICD-10-CM | POA: Insufficient documentation

## 2013-06-16 DIAGNOSIS — F319 Bipolar disorder, unspecified: Secondary | ICD-10-CM | POA: Insufficient documentation

## 2013-06-16 DIAGNOSIS — J4489 Other specified chronic obstructive pulmonary disease: Secondary | ICD-10-CM | POA: Insufficient documentation

## 2013-06-16 DIAGNOSIS — Z85118 Personal history of other malignant neoplasm of bronchus and lung: Secondary | ICD-10-CM | POA: Insufficient documentation

## 2013-06-16 DIAGNOSIS — E785 Hyperlipidemia, unspecified: Secondary | ICD-10-CM | POA: Insufficient documentation

## 2013-06-16 DIAGNOSIS — IMO0001 Reserved for inherently not codable concepts without codable children: Secondary | ICD-10-CM | POA: Insufficient documentation

## 2013-06-16 DIAGNOSIS — J449 Chronic obstructive pulmonary disease, unspecified: Secondary | ICD-10-CM | POA: Insufficient documentation

## 2013-06-16 DIAGNOSIS — Z01818 Encounter for other preprocedural examination: Secondary | ICD-10-CM | POA: Insufficient documentation

## 2013-06-16 DIAGNOSIS — W19XXXA Unspecified fall, initial encounter: Secondary | ICD-10-CM | POA: Insufficient documentation

## 2013-06-16 DIAGNOSIS — S42202A Unspecified fracture of upper end of left humerus, initial encounter for closed fracture: Secondary | ICD-10-CM

## 2013-06-16 DIAGNOSIS — E7800 Pure hypercholesterolemia, unspecified: Secondary | ICD-10-CM | POA: Insufficient documentation

## 2013-06-16 DIAGNOSIS — S42209A Unspecified fracture of upper end of unspecified humerus, initial encounter for closed fracture: Secondary | ICD-10-CM

## 2013-06-16 DIAGNOSIS — Z01810 Encounter for preprocedural cardiovascular examination: Secondary | ICD-10-CM | POA: Insufficient documentation

## 2013-06-16 HISTORY — DX: Unspecified fracture of upper end of left humerus, initial encounter for closed fracture: S42.202A

## 2013-06-16 HISTORY — PX: ORIF HUMERUS FRACTURE: SHX2126

## 2013-06-16 HISTORY — DX: Gastro-esophageal reflux disease without esophagitis: K21.9

## 2013-06-16 HISTORY — DX: Shortness of breath: R06.02

## 2013-06-16 LAB — CREATININE, SERUM: GFR calc non Af Amer: >90 mL/min (ref >90)

## 2013-06-16 LAB — CBC
Hemoglobin: 8.5 g/dL — ABNORMAL LOW (ref 12.0–15.0)
MCH: 30.7 pg (ref 26.0–34.0)
MCHC: 32.4 g/dL (ref 30.0–36.0)
MCV: 94.6 fL (ref 78.0–100.0)
RBC: 2.77 MIL/uL — ABNORMAL LOW (ref 3.87–5.11)

## 2013-06-16 SURGERY — OPEN REDUCTION INTERNAL FIXATION (ORIF) PROXIMAL HUMERUS FRACTURE
Anesthesia: General | Laterality: Left

## 2013-06-16 SURGERY — OPEN REDUCTION INTERNAL FIXATION (ORIF) PROXIMAL HUMERUS FRACTURE
Anesthesia: Regional | Site: Shoulder | Laterality: Left

## 2013-06-16 MED ORDER — PROPOFOL 10 MG/ML IV BOLUS
INTRAVENOUS | Status: DC | PRN
  Administered 2013-06-16: 160 mg via INTRAVENOUS

## 2013-06-16 MED ORDER — BISACODYL 10 MG RE SUPP: 10.0000 mg | Freq: Every day | RECTAL | Status: DC | PRN

## 2013-06-16 MED ORDER — ONDANSETRON HCL 4 MG/2ML IJ SOLN
INTRAMUSCULAR | Status: DC | PRN
  Administered 2013-06-16: 4 mg via INTRAVENOUS

## 2013-06-16 MED ORDER — DEXAMETHASONE SODIUM PHOSPHATE 10 MG/ML IJ SOLN
INTRAMUSCULAR | Status: DC | PRN
  Administered 2013-06-16: 4 mg via INTRAVENOUS

## 2013-06-16 MED ORDER — FENTANYL CITRATE 0.05 MG/ML IJ SOLN
INTRAMUSCULAR | Status: DC | PRN
  Administered 2013-06-16 (×4): 50 ug via INTRAVENOUS

## 2013-06-16 MED ORDER — ALUM & MAG HYDROXIDE-SIMETH 200-200-20 MG/5ML PO SUSP: 30.0000 mL | ORAL | Status: DC | PRN

## 2013-06-16 MED ORDER — PROMETHAZINE HCL 25 MG PO TABS: 25.0000 mg | ORAL_TABLET | Freq: Four times a day (QID) | ORAL | Status: DC | PRN

## 2013-06-16 MED ORDER — ALBUTEROL SULFATE (5 MG/ML) 0.5% IN NEBU
INHALATION_SOLUTION | RESPIRATORY_TRACT | Status: AC
  Administered 2013-06-16: 2.5 mg via RESPIRATORY_TRACT
  Filled 2013-06-16: qty 0.5

## 2013-06-16 MED ORDER — SUMATRIPTAN SUCCINATE 100 MG PO TABS
100.0000 mg | ORAL_TABLET | ORAL | Status: DC | PRN
  Filled 2013-06-16: qty 1

## 2013-06-16 MED ORDER — OXYCODONE-ACETAMINOPHEN 5-325 MG PO TABS
1.0000 | ORAL_TABLET | ORAL | Status: DC | PRN
  Administered 2013-06-16 – 2013-06-17 (×3): 2 via ORAL
  Filled 2013-06-16 (×3): qty 2

## 2013-06-16 MED ORDER — BUPROPION HCL 100 MG PO TABS
100.0000 mg | ORAL_TABLET | Freq: Two times a day (BID) | ORAL | Status: DC
  Administered 2013-06-17: 100 mg via ORAL
  Filled 2013-06-16 (×3): qty 1

## 2013-06-16 MED ORDER — CLONAZEPAM 1 MG PO TABS
1.0000 mg | ORAL_TABLET | Freq: Three times a day (TID) | ORAL | Status: DC
  Administered 2013-06-16 – 2013-06-17 (×3): 1 mg via ORAL
  Filled 2013-06-16: qty 2
  Filled 2013-06-16 (×2): qty 1

## 2013-06-16 MED ORDER — CEFAZOLIN SODIUM 1-5 GM-% IV SOLN
1.0000 g | Freq: Four times a day (QID) | INTRAVENOUS | Status: AC
  Administered 2013-06-16 – 2013-06-17 (×3): 1 g via INTRAVENOUS
  Filled 2013-06-16 (×4): qty 50

## 2013-06-16 MED ORDER — HYDROXYCHLOROQUINE SULFATE 200 MG PO TABS
400.0000 mg | ORAL_TABLET | Freq: Every day | ORAL | Status: DC
  Administered 2013-06-16 – 2013-06-17 (×2): 400 mg via ORAL
  Filled 2013-06-16 (×2): qty 2

## 2013-06-16 MED ORDER — GLYCOPYRROLATE 0.2 MG/ML IJ SOLN
INTRAMUSCULAR | Status: DC | PRN
  Administered 2013-06-16: 0.6 mg via INTRAVENOUS

## 2013-06-16 MED ORDER — ACETAMINOPHEN 325 MG PO TABS: 650.0000 mg | ORAL_TABLET | Freq: Four times a day (QID) | ORAL | Status: DC | PRN

## 2013-06-16 MED ORDER — ONDANSETRON HCL 4 MG PO TABS: 4.0000 mg | ORAL_TABLET | Freq: Four times a day (QID) | ORAL | Status: DC | PRN

## 2013-06-16 MED ORDER — CYCLOBENZAPRINE HCL 10 MG PO TABS
10.0000 mg | ORAL_TABLET | Freq: Two times a day (BID) | ORAL | Status: DC | PRN
  Administered 2013-06-17: 10 mg via ORAL
  Filled 2013-06-16: qty 1

## 2013-06-16 MED ORDER — DOCUSATE SODIUM 100 MG PO CAPS
100.0000 mg | ORAL_CAPSULE | Freq: Two times a day (BID) | ORAL | Status: DC
  Administered 2013-06-16 – 2013-06-17 (×2): 100 mg via ORAL
  Filled 2013-06-16 (×3): qty 1

## 2013-06-16 MED ORDER — QUETIAPINE FUMARATE 25 MG PO TABS
25.0000 mg | ORAL_TABLET | Freq: Every day | ORAL | Status: DC
  Administered 2013-06-16: 25 mg via ORAL
  Filled 2013-06-16 (×2): qty 1

## 2013-06-16 MED ORDER — ALBUMIN HUMAN 5 % IV SOLN
INTRAVENOUS | Status: DC | PRN
  Administered 2013-06-16: 14:00:00 via INTRAVENOUS

## 2013-06-16 MED ORDER — ROCURONIUM BROMIDE 100 MG/10ML IV SOLN
INTRAVENOUS | Status: DC | PRN
  Administered 2013-06-16: 50 mg via INTRAVENOUS
  Administered 2013-06-16: 10 mg via INTRAVENOUS

## 2013-06-16 MED ORDER — OXYCODONE HCL 5 MG PO TABS
5.0000 mg | ORAL_TABLET | ORAL | Status: DC | PRN
  Administered 2013-06-17: 10 mg via ORAL
  Filled 2013-06-16 (×2): qty 2

## 2013-06-16 MED ORDER — LIDOCAINE HCL (CARDIAC) 20 MG/ML IV SOLN
INTRAVENOUS | Status: DC | PRN
  Administered 2013-06-16: 60 mg via INTRAVENOUS

## 2013-06-16 MED ORDER — POTASSIUM CHLORIDE IN NACL 20-0.45 MEQ/L-% IV SOLN
INTRAVENOUS | Status: DC
  Administered 2013-06-16: 20:00:00 via INTRAVENOUS
  Filled 2013-06-16 (×3): qty 1000

## 2013-06-16 MED ORDER — ONDANSETRON HCL 4 MG/2ML IJ SOLN: 4.0000 mg | Freq: Four times a day (QID) | INTRAMUSCULAR | Status: DC | PRN

## 2013-06-16 MED ORDER — ZOLPIDEM TARTRATE 5 MG PO TABS: 5.0000 mg | ORAL_TABLET | Freq: Every evening | ORAL | Status: DC | PRN

## 2013-06-16 MED ORDER — 0.9 % SODIUM CHLORIDE (POUR BTL) OPTIME
TOPICAL | Status: DC | PRN
  Administered 2013-06-16: 1000 mL

## 2013-06-16 MED ORDER — METOCLOPRAMIDE HCL 10 MG PO TABS: 5.0000 mg | ORAL_TABLET | Freq: Three times a day (TID) | ORAL | Status: DC | PRN

## 2013-06-16 MED ORDER — PHENOL 1.4 % MT LIQD: 1.0000 | OROMUCOSAL | Status: DC | PRN

## 2013-06-16 MED ORDER — DIVALPROEX SODIUM 500 MG PO DR TAB
750.0000 mg | DELAYED_RELEASE_TABLET | Freq: Every day | ORAL | Status: DC
  Administered 2013-06-16: 22:00:00 750 mg via ORAL
  Filled 2013-06-16 (×2): qty 1

## 2013-06-16 MED ORDER — HYDROMORPHONE HCL PF 1 MG/ML IJ SOLN
0.5000 mg | INTRAMUSCULAR | Status: DC | PRN
  Administered 2013-06-16 – 2013-06-17 (×2): 1 mg via INTRAVENOUS
  Filled 2013-06-16: qty 1

## 2013-06-16 MED ORDER — ALBUTEROL SULFATE (5 MG/ML) 0.5% IN NEBU
2.5000 mg | INHALATION_SOLUTION | Freq: Once | RESPIRATORY_TRACT | Status: AC
  Administered 2013-06-16: 2.5 mg via RESPIRATORY_TRACT

## 2013-06-16 MED ORDER — TRIAMCINOLONE ACETONIDE 0.1 % EX CREA
1.0000 "application " | TOPICAL_CREAM | Freq: Two times a day (BID) | CUTANEOUS | Status: DC | PRN
  Filled 2013-06-16: qty 15

## 2013-06-16 MED ORDER — METHOCARBAMOL 500 MG PO TABS
500.0000 mg | ORAL_TABLET | Freq: Four times a day (QID) | ORAL | Status: DC | PRN
  Administered 2013-06-16: 500 mg via ORAL
  Filled 2013-06-16: qty 1

## 2013-06-16 MED ORDER — TRAMADOL HCL 50 MG PO TABS: 50.0000 mg | ORAL_TABLET | Freq: Four times a day (QID) | ORAL | Status: DC | PRN

## 2013-06-16 MED ORDER — ALBUTEROL SULFATE (5 MG/ML) 0.5% IN NEBU: 2.5000 mg | INHALATION_SOLUTION | RESPIRATORY_TRACT | Status: DC | PRN

## 2013-06-16 MED ORDER — LACTATED RINGERS IV SOLN
INTRAVENOUS | Status: DC | PRN
  Administered 2013-06-16 (×2): via INTRAVENOUS

## 2013-06-16 MED ORDER — FENTANYL CITRATE 0.05 MG/ML IJ SOLN
25.0000 ug | INTRAMUSCULAR | Status: DC | PRN
  Administered 2013-06-16 (×2): 50 ug via INTRAVENOUS

## 2013-06-16 MED ORDER — ENOXAPARIN SODIUM 40 MG/0.4ML ~~LOC~~ SOLN
40.0000 mg | SUBCUTANEOUS | Status: DC
  Administered 2013-06-16: 40 mg via SUBCUTANEOUS
  Filled 2013-06-16 (×2): qty 0.4

## 2013-06-16 MED ORDER — FOLIC ACID 1 MG PO TABS
1.0000 mg | ORAL_TABLET | Freq: Every day | ORAL | Status: DC
  Administered 2013-06-16 – 2013-06-17 (×2): 1 mg via ORAL
  Filled 2013-06-16 (×2): qty 1

## 2013-06-16 MED ORDER — ONDANSETRON HCL 4 MG/2ML IJ SOLN: 4.0000 mg | Freq: Once | INTRAMUSCULAR | Status: DC | PRN

## 2013-06-16 MED ORDER — BUPIVACAINE-EPINEPHRINE PF 0.5-1:200000 % IJ SOLN
INTRAMUSCULAR | Status: DC | PRN
  Administered 2013-06-16: 2 mL via PERINEURAL

## 2013-06-16 MED ORDER — METHOCARBAMOL 100 MG/ML IJ SOLN: 500.0000 mg | Freq: Four times a day (QID) | INTRAMUSCULAR | Status: DC | PRN

## 2013-06-16 MED ORDER — ARTIFICIAL TEARS OP OINT
TOPICAL_OINTMENT | OPHTHALMIC | Status: DC | PRN
  Administered 2013-06-16: 1 via OPHTHALMIC

## 2013-06-16 MED ORDER — METOCLOPRAMIDE HCL 5 MG/ML IJ SOLN: 5.0000 mg | Freq: Three times a day (TID) | INTRAMUSCULAR | Status: DC | PRN

## 2013-06-16 MED ORDER — MENTHOL 3 MG MT LOZG: 1.0000 | LOZENGE | OROMUCOSAL | Status: DC | PRN

## 2013-06-16 MED ORDER — DULOXETINE HCL 60 MG PO CPEP
60.0000 mg | ORAL_CAPSULE | Freq: Every day | ORAL | Status: DC
  Administered 2013-06-17: 60 mg via ORAL
  Filled 2013-06-16 (×2): qty 1

## 2013-06-16 MED ORDER — FENTANYL CITRATE 0.05 MG/ML IJ SOLN
INTRAMUSCULAR | Status: AC
  Administered 2013-06-16: 50 ug via INTRAVENOUS
  Filled 2013-06-16: qty 2

## 2013-06-16 MED ORDER — SENNA 8.6 MG PO TABS
1.0000 | ORAL_TABLET | Freq: Two times a day (BID) | ORAL | Status: DC
  Administered 2013-06-16 – 2013-06-17 (×2): 8.6 mg via ORAL
  Filled 2013-06-16 (×3): qty 1

## 2013-06-16 MED ORDER — BUPIVACAINE-EPINEPHRINE PF 0.25-1:200000 % IJ SOLN
INTRAMUSCULAR | Status: AC
  Filled 2013-06-16: qty 30

## 2013-06-16 MED ORDER — PHENYLEPHRINE HCL 10 MG/ML IJ SOLN
INTRAMUSCULAR | Status: DC | PRN
  Administered 2013-06-16 (×3): 80 ug via INTRAVENOUS

## 2013-06-16 MED ORDER — OXYCODONE-ACETAMINOPHEN 10-325 MG PO TABS: 1.0000 | ORAL_TABLET | Freq: Four times a day (QID) | ORAL | Status: DC | PRN

## 2013-06-16 MED ORDER — POLYETHYLENE GLYCOL 3350 17 G PO PACK: 17.0000 g | PACK | Freq: Every day | ORAL | Status: DC | PRN

## 2013-06-16 MED ORDER — DIPHENHYDRAMINE HCL 12.5 MG/5ML PO ELIX: 12.5000 mg | ORAL_SOLUTION | ORAL | Status: DC | PRN

## 2013-06-16 MED ORDER — PANTOPRAZOLE SODIUM 40 MG PO TBEC
80.0000 mg | DELAYED_RELEASE_TABLET | Freq: Every day | ORAL | Status: DC
  Administered 2013-06-16 – 2013-06-17 (×2): 80 mg via ORAL
  Filled 2013-06-16 (×2): qty 2

## 2013-06-16 MED ORDER — ACETAMINOPHEN 650 MG RE SUPP: 650.0000 mg | Freq: Four times a day (QID) | RECTAL | Status: DC | PRN

## 2013-06-16 MED ORDER — ORPHENADRINE CITRATE ER 100 MG PO TB12: 100.0000 mg | ORAL_TABLET | Freq: Two times a day (BID) | ORAL | Status: DC

## 2013-06-16 MED ORDER — NEOSTIGMINE METHYLSULFATE 1 MG/ML IJ SOLN
INTRAMUSCULAR | Status: DC | PRN
  Administered 2013-06-16: 4 mg via INTRAVENOUS

## 2013-06-16 MED ORDER — SIMVASTATIN 40 MG PO TABS
40.0000 mg | ORAL_TABLET | Freq: Every day | ORAL | Status: DC
  Administered 2013-06-16 – 2013-06-17 (×2): 40 mg via ORAL
  Filled 2013-06-16 (×2): qty 1

## 2013-06-16 SURGICAL SUPPLY — 70 items
APL SKNCLS STERI-STRIP NONHPOA (GAUZE/BANDAGES/DRESSINGS) ×1
BENZOIN TINCTURE PRP APPL 2/3 (GAUZE/BANDAGES/DRESSINGS) ×2 IMPLANT
BIT DRILL 2.8X4 QC CORT (BIT) ×1 IMPLANT
BIT DRILL 4 LONG FAST STEP (BIT) ×1 IMPLANT
BIT DRILL 4 SHORT FAST STEP (BIT) ×1 IMPLANT
CLEANER TIP ELECTROSURG 2X2 (MISCELLANEOUS) IMPLANT
CLSR STERI-STRIP ANTIMIC 1/2X4 (GAUZE/BANDAGES/DRESSINGS) ×1 IMPLANT
COVER SURGICAL LIGHT HANDLE (MISCELLANEOUS) ×2 IMPLANT
COVER TABLE BACK 60X90 (DRAPES) ×1 IMPLANT
DRAPE C-ARM 42X72 X-RAY (DRAPES) ×2 IMPLANT
DRAPE INCISE IOBAN 66X45 STRL (DRAPES) ×2 IMPLANT
DRAPE U-SHAPE 47X51 STRL (DRAPES) ×2 IMPLANT
DURAPREP 26ML APPLICATOR (WOUND CARE) ×3 IMPLANT
ELECT REM PT RETURN 9FT ADLT (ELECTROSURGICAL) ×2
ELECTRODE REM PT RTRN 9FT ADLT (ELECTROSURGICAL) IMPLANT
GAUZE XEROFORM 1X8 LF (GAUZE/BANDAGES/DRESSINGS) ×1 IMPLANT
GLOVE BIO SURGEON STRL SZ7.5 (GLOVE) ×1 IMPLANT
GLOVE BIOGEL PI IND STRL 7.0 (GLOVE) IMPLANT
GLOVE BIOGEL PI IND STRL 8 (GLOVE) IMPLANT
GLOVE BIOGEL PI INDICATOR 7.0 (GLOVE) ×1
GLOVE BIOGEL PI INDICATOR 8 (GLOVE) ×1
GLOVE BIOGEL PI ORTHO PRO SZ8 (GLOVE) ×1
GLOVE ORTHO TXT STRL SZ7.5 (GLOVE) ×3 IMPLANT
GLOVE PI ORTHO PRO STRL SZ8 (GLOVE) ×1 IMPLANT
GLOVE SURG ORTHO 8.0 STRL STRW (GLOVE) ×3 IMPLANT
GLOVE SURG SS PI 6.5 STRL IVOR (GLOVE) ×1 IMPLANT
GLOVE SURG SS PI 7.0 STRL IVOR (GLOVE) ×1 IMPLANT
GOWN STRL NON-REIN LRG LVL3 (GOWN DISPOSABLE) ×2 IMPLANT
GOWN STRL REIN XL XLG (GOWN DISPOSABLE) ×1 IMPLANT
KIT BASIN OR (CUSTOM PROCEDURE TRAY) ×2 IMPLANT
KIT ROOM TURNOVER OR (KITS) ×2 IMPLANT
MANIFOLD NEPTUNE II (INSTRUMENTS) ×1 IMPLANT
NDL HYPO 25GX1X1/2 BEV (NEEDLE) ×1 IMPLANT
NEEDLE HYPO 25GX1X1/2 BEV (NEEDLE) ×2 IMPLANT
NS IRRIG 1000ML POUR BTL (IV SOLUTION) ×2 IMPLANT
PACK SHOULDER (CUSTOM PROCEDURE TRAY) ×2 IMPLANT
PAD ARMBOARD 7.5X6 YLW CONV (MISCELLANEOUS) ×3 IMPLANT
PEG STND 4.0X32.5MM (Orthopedic Implant) ×2 IMPLANT
PEG STND 4.0X35MM (Orthopedic Implant) ×4 IMPLANT
PEG STND 4.0X37.5MM (Orthopedic Implant) ×2 IMPLANT
PEG STND 4.0X40MM (Orthopedic Implant) ×2 IMPLANT
PEG STND 4.0X45.0MM (Orthopedic Implant) ×2 IMPLANT
PEGSTD 4.0X32.5MM (Orthopedic Implant) IMPLANT
PEGSTD 4.0X35MM (Orthopedic Implant) IMPLANT
PEGSTD 4.0X37.5MM (Orthopedic Implant) IMPLANT
PEGSTD 4.0X40MM (Orthopedic Implant) IMPLANT
PEGSTD 4.0X45.0MM (Orthopedic Implant) IMPLANT
PIN GUIDE SHOULDER 2.0MM (PIN) ×2 IMPLANT
PLATE SHOULDER S3 3HOLE LT (Plate) ×1 IMPLANT
SCREW MULTIDIR 3.8X24 HUMRL (Screw) ×1 IMPLANT
SCREW NON LOCKING SHAFT 22MM (Screw) ×1 IMPLANT
SLING ARM IMMOBILIZER MED (SOFTGOODS) ×1 IMPLANT
SPONGE GAUZE 4X4 12PLY (GAUZE/BANDAGES/DRESSINGS) ×2 IMPLANT
SPONGE LAP 18X18 X RAY DECT (DISPOSABLE) ×1 IMPLANT
SPONGE LAP 4X18 X RAY DECT (DISPOSABLE) IMPLANT
STAPLER VISISTAT 35W (STAPLE) IMPLANT
STRIP CLOSURE SKIN 1/2X4 (GAUZE/BANDAGES/DRESSINGS) ×2 IMPLANT
SUCTION FRAZIER TIP 10 FR DISP (SUCTIONS) ×2 IMPLANT
SUPPORT WRAP ARM LG (MISCELLANEOUS) ×2 IMPLANT
SUT FIBERWIRE #2 38 T-5 BLUE (SUTURE) ×6
SUT MNCRL AB 4-0 PS2 18 (SUTURE) ×2 IMPLANT
SUT VIC AB 0 CTB1 27 (SUTURE) IMPLANT
SUT VIC AB 2-0 CT1 36 (SUTURE) ×2 IMPLANT
SUT VIC AB 3-0 FS2 27 (SUTURE) ×2 IMPLANT
SUTURE FIBERWR #2 38 T-5 BLUE (SUTURE) IMPLANT
SYR BULB IRRIGATION 50ML (SYRINGE) ×1 IMPLANT
SYR CONTROL 10ML LL (SYRINGE) ×2 IMPLANT
TOWEL OR 17X24 6PK STRL BLUE (TOWEL DISPOSABLE) ×2 IMPLANT
TOWEL OR 17X26 10 PK STRL BLUE (TOWEL DISPOSABLE) ×2 IMPLANT
WATER STERILE IRR 1000ML POUR (IV SOLUTION) ×1 IMPLANT

## 2013-06-16 NOTE — H&P (Signed)
PREOPERATIVE H&P  Chief Complaint: LEFT PROXIMAL HUMERUS FRACTURE   HPI: Maria Lucero is a 62 y.o. female who presents for preoperative history and physical with a diagnosis of LEFT PROXIMAL HUMERUS FRACTURE . Symptoms are rated as moderate to severe, and have been worsening.  This is significantly impairing activities of daily living.  She has elected for surgical management. She had acute onset pain, bruising, and has been completely unable to use the arm. Seen in the emergency room, referred to my office, and then we have gone through her primary care doctor for preoperative optimization, and are not proceeding with surgical intervention to optimize her long-term function.  Past Medical History  Diagnosis Date  . Migraine headache   . Anxiety   . Urticaria   . Low back pain syndrome   . Hyperlipidemia   . Hypercholesterolemia   . Depression   . Tobacco abuse   . Myalgia and myositis, unspecified   . Bipolar 1 disorder   . Allergy   . Asthma     per pt stated  . Blood transfusion     several x  . Cataract     recent surgery b/l  . Arthritis   . History of radiation therapy 04/11/12; 04/13/12; 04/15/12; 04/18/12; & 04/20/12    Rll lung 60Gy/5/fx  . GERD (gastroesophageal reflux disease)   . COPD (chronic obstructive pulmonary disease)     home o2 at 2l/min  . Shortness of breath     home o2 at 2l/min  . lung ca dx'd 11/2009    chemo comp 05/2010,home o2 at 2l/min  . Lung cancer     invasive left upper lobe adenocarcinoma  . Complication of anesthesia     low oxygen  . PONV (postoperative nausea and vomiting)    Past Surgical History  Procedure Laterality Date  . Back surgery      x 4 L3-S1 1996,2000, 2002  . Vesicovaginal fistula closure w/ tah    . Appendectomy    . Bronchoscopy  2011  . Lung biopsy  2011    needle  . Tonsillectomy    . Abdominal hysterectomy      with b/l oophoorectomy age 89  . Lumbar fusion     History   Social History  . Marital  Status: Married    Spouse Name: N/A    Number of Children: N/A  . Years of Education: N/A   Social History Main Topics  . Smoking status: Former Smoker -- 1.00 packs/day for 51 years    Types: Cigarettes    Quit date: 06/29/2008  . Smokeless tobacco: Never Used     Comment: Electronic cig-no nicotine-vapor  . Alcohol Use: No  . Drug Use: No  . Sexual Activity: No   Other Topics Concern  . Not on file   Social History Narrative  . No narrative on file   Family History  Problem Relation Age of Onset  . Heart disease      paternal side  . Rheum arthritis Mother   . Lung cancer    . Colon cancer    . Brain cancer    . Kidney cancer Maternal Grandmother    Allergies  Allergen Reactions  . Methocarbamol Nausea And Vomiting  . Morphine Sulfate Er Beads Other (See Comments)    confusion  . Aspirin Nausea And Vomiting  . Clarithromycin Other (See Comments)    Stomach problems   . Codeine Nausea And Vomiting  . Lyrica [Pregabalin]  Other (See Comments)    Weight gain   . Morphine Other (See Comments)    Passed out  . Levaquin [Levofloxacin] Rash   Prior to Admission medications   Medication Sig Start Date End Date Taking? Authorizing Provider  albuterol (PROVENTIL) (2.5 MG/3ML) 0.083% nebulizer solution Take 2.5 mg by nebulization every 6 (six) hours as needed for wheezing.   Yes Historical Provider, MD  buPROPion (WELLBUTRIN) 100 MG tablet Take 100 mg by mouth 2 (two) times daily.   Yes Historical Provider, MD  clonazePAM (KLONOPIN) 1 MG tablet Take 1-2 mg by mouth 3 (three) times daily.    Yes Historical Provider, MD  cyclobenzaprine (FLEXERIL) 10 MG tablet Take 1 tablet (10 mg total) by mouth 2 (two) times daily as needed for muscle spasms. 11/10/12  Yes Mora Bellman, PA-C  divalproex (DEPAKOTE) 250 MG DR tablet Take 750 mg by mouth at bedtime.   Yes Historical Provider, MD  doxycycline (VIBRAMYCIN) 100 MG capsule Take 100 mg by mouth 2 (two) times daily.  11/11/12   Yes Historical Provider, MD  DULoxetine (CYMBALTA) 60 MG capsule Take 60 mg by mouth daily before breakfast.    Yes Historical Provider, MD  folic acid (FOLVITE) 1 MG tablet Take 1 mg by mouth daily.   Yes Historical Provider, MD  hydroxychloroquine (PLAQUENIL) 200 MG tablet Take 400 mg by mouth daily.    Yes Historical Provider, MD  omeprazole (PRILOSEC) 20 MG capsule Take 20 mg by mouth daily.     Yes Historical Provider, MD  oxyCODONE-acetaminophen (PERCOCET) 10-325 MG per tablet Take 1 tablet by mouth every 4 (four) hours as needed. Pain 06/11/13  Yes Gerhard Munch, MD  QUEtiapine (SEROQUEL) 25 MG tablet Take 25 mg by mouth at bedtime.     Yes Historical Provider, MD  simvastatin (ZOCOR) 40 MG tablet Take 40 mg by mouth daily.  08/27/11  Yes Historical Provider, MD  SUMAtriptan (IMITREX) 100 MG tablet Take 100 mg by mouth every 2 (two) hours as needed. Headache    Yes Historical Provider, MD  traMADol (ULTRAM) 50 MG tablet Take 50 mg by mouth every 6 (six) hours as needed for pain.   Yes [name], MD  triamcinolone cream (KENALOG) 0.1 % Apply 1 application topically 2 (two) times daily as needed (for itching).  10/18/12  Yes Historical Provider, MD     Positive ROS: All other systems have been reviewed and were otherwise negative with the exception of those mentioned in the HPI and as above.  Physical Exam: General: Alert, no acute distress, fairly frail and deconditioned. Cardiovascular: No pedal edema Respiratory: Mild cyanosis, mild use of accessory musculature, at baseline, on chronic oxygen. GI: No organomegaly, abdomen is soft and non-tender Skin: No lesions in the area of chief complaint, with the exception of bruising around the left shoulder. Neurologic: Sensation intact distally Psychiatric: Patient is competent for consent with normal mood and affect Lymphatic: No axillary or cervical lymphadenopathy  MUSCULOSKELETAL: Left proximal humerus has an ecchymosis and  bruising, with no active motion of the shoulder. All fingers do flex extend and abduct.  Assessment: LEFT PROXIMAL HUMERUS FRACTURE with coexisting risk factors including her severe pulmonary disease.  Plan: Plan for Procedure(s): OPEN REDUCTION INTERNAL FIXATION (ORIF) LEFT  PROXIMAL HUMERUS FRACTURE versus possible hemiarthroplasty.  The risks benefits and alternatives were discussed with the patient including but not limited to the risks of nonoperative treatment, versus surgical intervention including infection, bleeding, nerve injury,  blood clots, cardiopulmonary complications,  morbidity, mortality, among others, and they were willing to proceed.   Eulas Post, MD Cell [phone]   06/16/2013 7:26 AM

## 2013-06-16 NOTE — Progress Notes (Signed)
Orthopedic Tech Progress Note Patient Details:  Maria Lucero May 30, 1951 [id number] Unable to use ohf properly.      [name] 06/16/2013, 7:32 PM

## 2013-06-16 NOTE — Anesthesia Procedure Notes (Addendum)
Anesthesia Regional Block:  Interscalene brachial plexus block  Pre-Anesthetic Checklist: ,, timeout performed, Correct Patient, Correct Site, Correct Laterality, Correct Procedure, Correct Position, site marked, Risks and benefits discussed,  Surgical consent,  Pre-op evaluation,  At surgeon's request and post-op pain management  Laterality: Left and Upper  Prep: chloraprep       Needles:  Injection technique: Single-shot  Needle Type: Echogenic Needle     Needle Length: 5cm 5 cm Needle Gauge: 21 and 21 G    Additional Needles:  Procedures: ultrasound guided (picture in chart) Interscalene brachial plexus block Narrative:  Start time: 06/16/2013 12:45 PM End time: 06/16/2013 12:52 PM Injection made incrementally with aspirations every 5 mL.  Performed by: Personally  Anesthesiologist: Sheldon Silvan, MD   Procedure Name: Intubation Date/Time: 06/16/2013 1:10 PM Performed by: Fransisca Kaufmann Pre-anesthesia Checklist: Patient identified, Emergency Drugs available, Suction available, Patient being monitored and Timeout performed Patient Re-evaluated:Patient Re-evaluated prior to inductionOxygen Delivery Method: Circle system utilized Preoxygenation: Pre-oxygenation with 100% oxygen Intubation Type: IV induction Ventilation: Mask ventilation without difficulty Laryngoscope Size: Miller and 2 Grade View: Grade I Tube type: Oral Tube size: 7.5 mm Number of attempts: 1 Airway Equipment and Method: Stylet Placement Confirmation: ETT inserted through vocal cords under direct vision,  positive ETCO2 and breath sounds checked- equal and bilateral Secured at: 21 cm Tube secured with: Tape Dental Injury: Teeth and Oropharynx as per pre-operative assessment

## 2013-06-16 NOTE — Op Note (Signed)
06/16/2013  3:52 PM  PATIENT:  Maria Lucero    PRE-OPERATIVE DIAGNOSIS:  LEFT PROXIMAL HUMERUS FRACTURE   POST-OPERATIVE DIAGNOSIS:  Same  PROCEDURE:  OPEN REDUCTION INTERNAL FIXATION (ORIF) LEFT SHOULDER  SURGEON:  Eulas Post, MD  PHYSICIAN ASSISTANT: Janace Litten, OPA-C, present and scrubbed throughout the case, critical for completion in a timely fashion, and for retraction, and instrumentation.  ANESTHESIA:   General  PREOPERATIVE INDICATIONS:  Maria Lucero is a  62 y.o. female with a diagnosis of LEFT PROXIMAL HUMERUS FRACTURE  who elected for surgical management.    The risks benefits and alternatives were discussed with the patient including but not limited to the risks of nonoperative treatment, versus surgical intervention including infection, bleeding, nerve injury, malunion, nonunion, the need for revision surgery, hardware prominence, hardware failure, the need for hardware removal, blood clots, cardiopulmonary complications, conversion to arthroplasty, morbidity, mortality, among others, and they were willing to proceed.  Predicted outcome is good, although there will be at least a six to nine month expected recovery.    OPERATIVE IMPLANTS: Biomet S3 locking plate  OPERATIVE FINDINGS: Displaced proximal humerus fracture  OPERATIVE PROCEDURE: The patient was brought to the operating room and placed in the supine position. General anesthesia was administered. IV antibiotics were given. She was placed in the beach chair position. All bony prominences were padded. The upper extremity was prepped and draped in usual sterile fashion. Deltopectoral incision was performed. The anatomy was extremely distorted due to the fracture displacement and severe soft tissue swelling. I did ultimately find the deltopectoral interval.  I exposed the fracture site, and placed deep retractors. I did not tenotomize the biceps tendon. This was left in place. I elevated a small portion of  the deltoid off of the shaft, in order to gain access for the plate. I placed supraspinatus and subscapularis stitches, and then reduced the head onto the shaft. This was maintained in satisfactory position. There was medial comminution of the calcar, which was not visualized except for radiographically.  I applied the plate and secured it into the sliding hole first. I confirmed position of the reduction and the plate with C-arm, and I placed a total of 2 guidewires into the appropriate position in the head. I was satisfied that the plate was distal appropriately, and then secured the plate proximally with smooth pegs, taking care to prevent penetration into the arch articular surface, using C-arm, as well as manual feel using a hand drill.  I then secured the plate distally using another cortical screw. Once complete fixation and reduction of been achieved, took final C-arm pictures, and irrigated the wounds copiously, and repaired the deltopectoral interval with Vicryl followed by Vicryl for the subcutaneous tissue with Monocryl and Steri-Strips for the skin. She was placed in a sling. She had a preoperative regional block as well. She tolerated the procedure well with no complications. She will be admitted overnight for observation due to her severe lung problems.

## 2013-06-16 NOTE — Anesthesia Preprocedure Evaluation (Signed)
Anesthesia Evaluation  Patient identified by MRN, date of birth, ID band Patient awake    Reviewed: Allergy & Precautions, H&P , NPO status , Patient's Chart, lab work & pertinent test results  Airway Mallampati: I TM Distance: >3 FB Neck ROM: Full    Dental  (+) Teeth Intact, Edentulous Upper and Dental Advisory Given   Pulmonary shortness of breath, asthma , COPD oxygen dependent, former smoker,  breath sounds clear to auscultation        Cardiovascular Rhythm:Regular Rate:Normal     Neuro/Psych    GI/Hepatic GERD-  Medicated,  Endo/Other    Renal/GU      Musculoskeletal   Abdominal   Peds  Hematology   Anesthesia Other Findings   Reproductive/Obstetrics                           Anesthesia Physical Anesthesia Plan  ASA: III  Anesthesia Plan: General   Post-op Pain Management:    Induction: Intravenous  Airway Management Planned: Oral ETT  Additional Equipment:   Intra-op Plan:   Post-operative Plan: Extubation in OR  Informed Consent: I have reviewed the patients History and Physical, chart, labs and discussed the procedure including the risks, benefits and alternatives for the proposed anesthesia with the patient or authorized representative who has indicated his/her understanding and acceptance.   Dental advisory given  Plan Discussed with: CRNA, Anesthesiologist and Surgeon  Anesthesia Plan Comments:         Anesthesia Quick Evaluation

## 2013-06-16 NOTE — Progress Notes (Signed)
Patient given breathing treatment reports feeling better post tx.

## 2013-06-16 NOTE — Transfer of Care (Signed)
Immediate Anesthesia Transfer of Care Note  Patient: Maria Lucero  Procedure(s) Performed: Procedure(s): OPEN REDUCTION INTERNAL FIXATION (ORIF) LEFT SHOULDER (Left)  Patient Location: PACU  Anesthesia Type:General and Regional  Level of Consciousness: awake, alert , oriented and sedated  Airway & Oxygen Therapy: Patient Spontanous Breathing and Patient connected to face mask oxygen  Post-op Assessment: Report given to PACU RN, Post -op Vital signs reviewed and stable and Patient moving all extremities  Post vital signs: Reviewed and stable  Complications: No apparent anesthesia complications

## 2013-06-16 NOTE — Preoperative (Signed)
Beta Blockers   Reason not to administer Beta Blockers:Not Applicable 

## 2013-06-17 LAB — BASIC METABOLIC PANEL
BUN: 5 mg/dL — ABNORMAL LOW (ref 6–23)
CO2: 31 mEq/L (ref 19–32)
GFR calc Af Amer: >90 mL/min (ref >90)
GFR calc non Af Amer: >90 mL/min (ref >90)
Glucose, Bld: 112 mg/dL — ABNORMAL HIGH (ref 70–99)
Potassium: 3.8 mEq/L (ref 3.5–5.1)
Sodium: 138 mEq/L (ref 135–145)

## 2013-06-17 LAB — CBC
HCT: 24.8 % — ABNORMAL LOW (ref 36.0–46.0)
Hemoglobin: 8.2 g/dL — ABNORMAL LOW (ref 12.0–15.0)
MCH: 30.8 pg (ref 26.0–34.0)
MCHC: 33.1 g/dL (ref 30.0–36.0)
MCV: 93.2 fL (ref 78.0–100.0)
RBC: 2.66 MIL/uL — ABNORMAL LOW (ref 3.87–5.11)

## 2013-06-17 LAB — GLUCOSE, CAPILLARY: Glucose-Capillary: 97 mg/dL (ref 70–99)

## 2013-06-17 NOTE — Progress Notes (Signed)
Utilization Review completed.  

## 2013-06-17 NOTE — Progress Notes (Addendum)
Pt discharged to home. D/c instructions given. No questions verbalized. Vitals stable. 

## 2013-06-17 NOTE — Discharge Summary (Signed)
Physician Discharge Summary  Patient ID: Maria Lucero MRN: [id number] DOB/AGE: 01-01-1951 62 y.o.  Admit date: 06/16/2013 Discharge date: 06/17/2013  Admission Diagnoses:  Fracture of humerus, proximal, left, closed  Discharge Diagnoses:  Principal Problem:   Fracture of humerus, proximal, left, closed Active Problems:   Fracture, humerus, proximal mild ABLA, observed, asymptomatic  Past Medical History  Diagnosis Date  . Migraine headache   . Anxiety   . Urticaria   . Low back pain syndrome   . Hyperlipidemia   . Hypercholesterolemia   . Depression   . Tobacco abuse   . Myalgia and myositis, unspecified   . Bipolar 1 disorder   . Allergy   . Asthma     per pt stated  . Blood transfusion     several x  . Cataract     recent surgery b/l  . Arthritis   . History of radiation therapy 04/11/12; 04/13/12; 04/15/12; 04/18/12; & 04/20/12    Rll lung 60Gy/5/fx  . GERD (gastroesophageal reflux disease)   . COPD (chronic obstructive pulmonary disease)     home o2 at 2l/min  . Shortness of breath     home o2 at 2l/min  . lung ca dx'd 11/2009    chemo comp 05/2010,home o2 at 2l/min  . Lung cancer     invasive left upper lobe adenocarcinoma  . Complication of anesthesia     low oxygen  . PONV (postoperative nausea and vomiting)   . Fracture of humerus, proximal, left, closed 06/16/2013    Surgeries: Procedure(s): OPEN REDUCTION INTERNAL FIXATION (ORIF) LEFT SHOULDER on 06/16/2013   Consultants (if any):    Discharged Condition: Improved  Hospital Course: [name] is an 62 y.o. female who was admitted 06/16/2013 with a diagnosis of Fracture of humerus, proximal, left, closed and went to the operating room on 06/16/2013 and underwent the above named procedures.    She was given perioperative antibiotics:  Anti-infectives   Start     Dose/Rate Route Frequency Ordered Stop   06/16/13 1900  hydroxychloroquine (PLAQUENIL) tablet 400 mg     400 mg Oral Daily  06/16/13 1832     06/16/13 1845  ceFAZolin (ANCEF) IVPB 1 g/50 mL premix     1 g 100 mL/hr over 30 Minutes Intravenous Every 6 hours 06/16/13 1832 06/17/13 0652   06/16/13 0600  ceFAZolin (ANCEF) IVPB 2 g/50 mL premix     2 g 100 mL/hr over 30 Minutes Intravenous On call to O.R. 06/15/13 1430 06/16/13 1302    .  She was given sequential compression devices, early ambulation, and lovenox for DVT prophylaxis.  She benefited maximally from the hospital stay and there were no complications.  Left hand all fingers f/e/abduct pod1.  Recent vital signs:  Filed Vitals:   06/17/13 1349  BP: 153/70  Pulse: 100  Temp: 98.3 F (36.8 C)  Resp: 20    Recent laboratory studies:  Lab Results  Component Value Date   HGB 8.2* 06/17/2013   HGB 8.5* 06/16/2013   HGB 10.3* 06/15/2013   Lab Results  Component Value Date   WBC 5.9 06/17/2013   PLT 310 06/17/2013   Lab Results  Component Value Date   INR 0.88 09/22/2011   Lab Results  Component Value Date   NA 138 06/17/2013   K 3.8 06/17/2013   CL 98 06/17/2013   CO2 31 06/17/2013   BUN 5* 06/17/2013   CREATININE 0.56 06/17/2013   GLUCOSE 112* 06/17/2013  Discharge Medications:     Medication List    STOP taking these medications       cyclobenzaprine 10 MG tablet  Commonly known as:  FLEXERIL     doxycycline 100 MG capsule  Commonly known as:  VIBRAMYCIN      TAKE these medications       albuterol (2.5 MG/3ML) 0.083% nebulizer solution  Commonly known as:  PROVENTIL  Take 2.5 mg by nebulization every 6 (six) hours as needed for wheezing.     buPROPion 100 MG tablet  Commonly known as:  WELLBUTRIN  Take 100 mg by mouth 2 (two) times daily.     clonazePAM 1 MG tablet  Commonly known as:  KLONOPIN  Take 1-2 mg by mouth 3 (three) times daily.     divalproex 250 MG DR tablet  Commonly known as:  DEPAKOTE  Take 750 mg by mouth at bedtime.     DULoxetine 60 MG capsule  Commonly known as:  CYMBALTA  Take 60 mg  by mouth daily before breakfast.     folic acid 1 MG tablet  Commonly known as:  FOLVITE  Take 1 mg by mouth daily.     hydroxychloroquine 200 MG tablet  Commonly known as:  PLAQUENIL  Take 400 mg by mouth daily.     omeprazole 20 MG capsule  Commonly known as:  PRILOSEC  Take 20 mg by mouth daily.     oxyCODONE-acetaminophen 10-325 MG per tablet  Commonly known as:  PERCOCET  Take 1-2 tablets by mouth every 6 (six) hours as needed. Pain     promethazine 25 MG tablet  Commonly known as:  PHENERGAN  Take 1 tablet (25 mg total) by mouth every 6 (six) hours as needed for nausea or vomiting.     QUEtiapine 25 MG tablet  Commonly known as:  SEROQUEL  Take 25 mg by mouth at bedtime.     simvastatin 40 MG tablet  Commonly known as:  ZOCOR  Take 40 mg by mouth daily.     SUMAtriptan 100 MG tablet  Commonly known as:  IMITREX  - Take 100 mg by mouth every 2 (two) hours as needed. Headache  -      traMADol 50 MG tablet  Commonly known as:  ULTRAM  Take 50 mg by mouth every 6 (six) hours as needed for pain.     triamcinolone cream 0.1 %  Commonly known as:  KENALOG  Apply 1 application topically 2 (two) times daily as needed (for itching).        Diagnostic Studies:  Chest 2 View  06/15/2013   CLINICAL DATA:  Left shoulder fracture preoperative evaluation  EXAM: CHEST  2 VIEW  COMPARISON:  03/08/2013  FINDINGS: Cardiac shadow is stable. A right chest wall port is again seen in satisfactory position. The right lung is clear with the exception of some changes of scarring in the right lung base stable from multiple previous exams. There is masslike density noted along the lateral aspect of left hemi thorax. It is stable from multiple previous exams and consistent with post radiation therapy. A fracture of the proximal left humerus is seen. No other bony abnormality is noted. Fixation hardware is noted in the lower thoracic and upper lumbar spines.  IMPRESSION: Diffuse chronic  changes bilaterally. No acute intrathoracic abnormality is noted.  Left humeral fracture   Electronically Signed   By: Alcide Clever M.D.   On: 06/15/2013 16:12   Ct Shoulder Left  Wo Contrast  06/11/2013   CLINICAL DATA:  Fall. Left arm pain. Radiata extremity straight it proximal left femur fracture.  EXAM: CT OF THE LEFT SHOULDER WITHOUT CONTRAST  TECHNIQUE: Multidetector CT imaging was performed according to the standard protocol. Multiplanar CT image reconstructions were also generated.  COMPARISON:  06/11/2013.  FINDINGS: The anatomic neck and humeral head remain intact. The humeral head remains located. Scapula is within normal limits. There is a comminuted proximal humeral metaphysis fracture. The fracture shows 1 shaft width anterior displacement relative to the metaphysis. Comminuted fracture fragments are present. Stranding is present in the anterior subcutaneous fat and there is intramuscular hematoma in the anterior deltoid. Fracture extends into the lower aspect of the bicipital groove. Nondisplaced fracture extends into the surgical neck of the humerus. There is no rotator cuff muscular atrophy. No significant AC joint osteoarthritis.  The lungs demonstrate area chronic consolidation in the left mid lung, unchanged from CT 12/26/2012. Old corticated rib fractures are present in the left lateral chest. Partially visualized right IJ catheter.  IMPRESSION: 1. Comminuted proximal left humeral metaphysis fracture with 1 shaft width anterior displacement. Nondisplaced fractures extend into the surgical neck of the humerus. 2. Chronic changes in the lungs with old left-sided rib fractures. 3. Hematoma in the anterior deltoid.   Electronically Signed   By: Andreas Newport M.D.   On: 06/11/2013 16:19   Dg Shoulder Left  06/16/2013   CLINICAL DATA:  Left shoulder surgery.  EXAM: LEFT SHOULDER - 2+ VIEW  COMPARISON:  06/11/2013  FINDINGS: Two portable images the of the left shoulder demonstrate changes of  plate and screw fixation across the comminuted left humeral neck fracture. Slight displacement of a lateral fragment, otherwise near anatomic alignment. No subluxation or dislocation.  Chronic airspace opacity noted in the left upper lobe, stable since prior chest x-ray.  IMPRESSION: Internal fixation of the left humeral neck fracture as above.   Electronically Signed   By: Charlett Nose M.D.   On: 06/16/2013 19:41   Dg Shoulder Left  06/16/2013   CLINICAL DATA:  ORIF for left proximal humeral fracture.  EXAM: LEFT SHOULDER - 2+ VIEW  COMPARISON:  CT scan of the left shoulder dated 11 June 2013.  FINDINGS: The patient has undergone ORIF for a fracture through the neck of the left humerus. The previously significantly displaced fracture has been reduced and alignment is now more nearly anatomic.  IMPRESSION: The patient has undergone ORIF for proximal left humeral fracture. Further interpretation is deferred to Dr. [name]   Electronically Signed   By: David  [place]   On: 06/16/2013 15:33   Dg Shoulder Left  06/11/2013   CLINICAL DATA:  Fall. Left shoulder pain. Obvious fracture deformity.  EXAM: LEFT SHOULDER - 2+ VIEW  COMPARISON:  None.  FINDINGS: Left humeral neck fracture is seen, with medial displacement of the humeral shaft by greater than 1 shaft's width. No evidence of dislocation of humeral head.  IMPRESSION: Left femoral neck fracture, with significant medial displacement of humeral shaft.   Electronically Signed   By: Myles Rosenthal M.D.   On: 06/11/2013 14:09    Disposition: 01-Home or Self Care      Discharge Orders   Future Appointments Provider Department Dept Phone   07/17/2013 12:00 PM [facility] [phone]   07/17/2013 1:00 PM [facility] [phone]   Liquids only 4 hours prior to your exam. Any medications can  be taken as usual. Please arrive 15 min prior to your scheduled exam time.    07/17/2013 2:00 PM Waymon Budge, MD [facility] [phone]   07/19/2013 12:00 PM Si Gaul, MD [facility] MED[facility] [phone]   Future Orders Complete By Expires   Call MD / Call 911  As directed    Comments:     If you experience chest pain or shortness of breath, CALL 911 and be transported to the hospital emergency room.  If you develope a fever above 101 F, pus (white drainage) or increased drainage or redness at the wound, or calf pain, call your surgeon's office.   Constipation Prevention  As directed    Comments:     Drink plenty of fluids.  Prune juice may be helpful.  You may use a stool softener, such as Colace (over the counter) 100 mg twice a day.  Use MiraLax (over the counter) for constipation as needed.   Diet general  As directed    Discharge instructions  As directed    Comments:     Change dressing in 3 days and reapply fresh dressing, unless you have a splint (half cast).  If you have a splint/cast, just leave in place until your follow-up appointment.    Keep wounds dry for 3 weeks.  Leave steri-strips in place on skin.  Do not apply lotion or anything to the wound.      Follow-up Information   Follow up with Eulas Post, MD. Schedule an appointment as soon as possible for a visit in 2 weeks.   Specialty:  Orthopedic Surgery   Contact information:   [address] [place] [address] [phone]        Signed: Eulas Post 06/17/2013, 2:26 PM

## 2013-06-17 NOTE — Evaluation (Signed)
Physical Therapy Evaluation Patient Details Name: Maria Lucero MRN: [id number] DOB: [birth year] Today's Date: 06/17/2013 Time: 1002-1037 PT Time Calculation (min): 35 min  PT Assessment / Plan / Recommendation History of Present Illness  Maria Lucero is a 62 y.o. female who presents for preoperative history and physical with a diagnosis of LEFT PROXIMAL HUMERUS FRACTURE . Symptoms are rated as moderate to severe, and have been worsening.  This is significantly impairing activities of daily living.  She has elected for surgical management. She had acute onset pain, bruising, and has been completely unable to use the arm. Seen in the emergency room, referred to my office, and then we have gone through her primary care doctor for preoperative optimization, and are not proceeding with surgical intervention to optimize her long-term function.  Clinical Impression  Patient presents with problems listed below.  Will benefit from acute PT to maximize independence prior to discharge.  Patient requiring mod assist with mobility/gait.  Reports husband is ill and not able to provide significant assist.  Recommend ST-SNF at discharge for continued therapy.    PT Assessment  Patient needs continued PT services    Follow Up Recommendations  SNF;Supervision/Assistance - 24 hour    Does the patient have the potential to tolerate intense rehabilitation      Barriers to Discharge        Equipment Recommendations  None recommended by PT    Recommendations for Other Services     Frequency Min 3X/week    Precautions / Restrictions Precautions Precautions: Fall;Shoulder Required Braces or Orthoses: Sling (at all times) Restrictions Weight Bearing Restrictions: Yes LUE Weight Bearing: Non weight bearing   Pertinent Vitals/Pain Pain impacting mobility      Mobility  Bed Mobility Bed Mobility: Rolling Right;Right Sidelying to Sit;Sitting - Scoot to Edge of Bed Rolling Right: 4: Min guard;With  rail Right Sidelying to Sit: 4: Min assist;With rails Sitting - Scoot to Edge of Bed: 4: Min guard Details for Bed Mobility Assistance: Verbal cues for technique.  Assist to raise trunk to sitting position.  In sitting, patient initially lightheaded.  Passed with time. Transfers Transfers: Sit to Stand;Stand to Sit Sit to Stand: 4: Min assist;With upper extremity assist;From bed;From toilet Stand to Sit: 4: Min assist;With upper extremity assist;To toilet;With armrests;To chair/3-in-1 Details for Transfer Assistance: Verbal cues for use of RUE to assist with transfers.  Assist to rise to standing and for balance. Ambulation/Gait Ambulation/Gait Assistance: 3: Mod assist Ambulation Distance (Feet): 25 Feet Assistive device: 1 person hand held assist Ambulation/Gait Assistance Details: Verbal cues to move slowly for safety.  Patient with decreased balance, losing balance x2 during gait.  Required mod assist to maintain upright balance.   Gait Pattern: Step-through pattern;Decreased stride length;Trunk flexed Gait velocity: Slow    Exercises Donning/doffing shirt without moving shoulder: Maximal assistance Method for sponge bathing under operated UE: Maximal assistance Donning/doffing sling/immobilizer: Maximal assistance Correct positioning of sling/immobilizer: Maximal assistance ROM for elbow, wrist and digits of operated UE: Moderate assistance Sling wearing schedule (on at all times/off for ADL's): Supervision/safety Positioning of UE while sleeping: Supervision/safety   PT Diagnosis: Difficulty walking;Generalized weakness;Acute pain;Altered mental status  PT Problem List: Decreased strength;Decreased range of motion;Decreased activity tolerance;Decreased balance;Decreased mobility;Decreased cognition;Decreased knowledge of use of DME;Cardiopulmonary status limiting activity;Pain PT Treatment Interventions: DME instruction;Gait training;Functional mobility training;Balance  training;Patient/family education     PT Goals(Current goals can be found in the care plan section) Acute Rehab PT Goals Patient Stated Goal: to get  better PT Goal Formulation: With patient Time For Goal Achievement: 06/24/13 Potential to Achieve Goals: Good  Visit Information  Last PT Received On: 06/17/13 Assistance Needed: +1 History of Present Illness: Maria Lucero is a 62 y.o. female who presents for preoperative history and physical with a diagnosis of LEFT PROXIMAL HUMERUS FRACTURE . Symptoms are rated as moderate to severe, and have been worsening.  This is significantly impairing activities of daily living.  She has elected for surgical management. She had acute onset pain, bruising, and has been completely unable to use the arm. Seen in the emergency room, referred to my office, and then we have gone through her primary care doctor for preoperative optimization, and are not proceeding with surgical intervention to optimize her long-term function.       Prior Functioning  Home Living Family/patient expects to be discharged to:: Skilled nursing facility Living Arrangements: Spouse/significant other Available Help at Discharge: Family;Available 24 hours/day (Patient reports husband is sick and unable to provide help.) Type of Home: House Home Access: Ramped entrance Home Layout: One level Home Equipment: Walker - 2 wheels;Walker - 4 wheels;Wheelchair - manual Prior Function Level of Independence: Needs assistance Gait / Transfers Assistance Needed: Patient reports minimal ambulation pta.  Has been using w/c. ADL's / Homemaking Assistance Needed: Husband does housekeeping. Pt states that hse does her own sponge baths and dressing (wears gowns, pajamas and socks) Communication Communication: No difficulties Dominant Hand: Right    Cognition  Cognition Arousal/Alertness: Lethargic Behavior During Therapy: Anxious Overall Cognitive Status: Impaired/Different from  baseline Area of Impairment: Orientation;Memory Orientation Level: Time;Place Memory: Decreased short-term memory    Extremity/Trunk Assessment Upper Extremity Assessment Upper Extremity Assessment: Defer to OT evaluation LUE Deficits / Details: Decreased ROM and strength - ORIF - in sling LUE: Unable to fully assess due to pain;Unable to fully assess due to immobilization Lower Extremity Assessment Lower Extremity Assessment: Generalized weakness;RLE deficits/detail RLE Deficits / Details: Strength 4-/5 Cervical / Trunk Assessment Cervical / Trunk Assessment: Normal   Balance Balance Balance Assessed: Yes Static Sitting Balance Static Sitting - Balance Support: No upper extremity supported;Feet supported Static Sitting - Level of Assistance: 5: Stand by assistance Static Sitting - Comment/# of Minutes: 6 Dynamic Sitting Balance Dynamic Sitting - Level of Assistance: 4: Min Oncologist Standing - Balance Support: Right upper extremity supported Static Standing - Level of Assistance: 4: Min assist Static Standing - Comment/# of Minutes: 2 Dynamic Standing Balance Dynamic Standing - Balance Support: Left upper extremity supported;Right upper extremity supported;During functional activity Dynamic Standing - Level of Assistance: 4: Min assist;3: Mod assist  End of Session PT - End of Session Equipment Utilized During Treatment: Gait belt;Oxygen Activity Tolerance: Patient limited by fatigue;Patient limited by pain Patient left: in chair;with call bell/phone within reach Nurse Communication: Mobility status  GP Functional Assessment Tool Used: Clinical judgement Functional Limitation: Mobility: Walking and moving around Mobility: Walking and Moving Around Current Status ([id number]): At least 20 percent but less than 40 percent impaired, limited or restricted Mobility: Walking and Moving Around Goal Status [phone]): At least 1 percent but less than 20 percent  impaired, limited or restricted   Vena Austria 06/17/2013, 4:21 PM Durenda Hurt. Renaldo Fiddler, [facility] Acute Rehab Services Pager [phone]

## 2013-06-17 NOTE — Evaluation (Signed)
Occupational Therapy Evaluation Patient Details Name: Maria Lucero MRN: [id number] DOB: [birth year] Today's Date: 06/17/2013 Time:  -     OT Assessment / Plan / Recommendation History of present illness Maria Lucero is a 62 y.o. female who presents for preoperative history and physical with a diagnosis of LEFT PROXIMAL HUMERUS FRACTURE . Symptoms are rated as moderate to severe, and have been worsening.  This is significantly impairing activities of daily living.  She has elected for surgical management. She had acute onset pain, bruising, and has been completely unable to use the arm. Seen in the emergency room, referred to my office, and then we have gone through her primary care doctor for preoperative optimization, and are not proceeding with surgical intervention to optimize her long-term function.   Clinical Impression   Pt demos decline in function with ADLs and ADL mobility safety and would benefit from acute OT services to address impairments to increase level of function and safety. Recommend SNF for safest d/c option for rehab due to pt/s extensive level of care required for ADLs, L UE impaired functional use, decreased strength, balance and endurance as well as pt's O2 SAT level dropping during mobility. Pt is on 2 L O2 at present as well as at home. Uncertain of pt's husband would be able to provide currents level of care or that Maria Lucero OT/PT will be sufficient for her therapy needs at this time. Pt provided with sling and AD education with handout    OT Assessment  Patient needs continued OT Services    Follow Up Recommendations  SNF;Supervision/Assistance - 24 hour    Barriers to Discharge Decreased caregiver support Uncertain if pt's spouse will be able to provide current leevl of care due to extensive assist required with ADLs, dropping O2 SATs with activity and decreased strength and balance  Equipment Recommendations  Other (comment) (TBD)    Recommendations for Other  Services    Frequency  Min 2X/week    Precautions / Restrictions Precautions Required Braces or Orthoses: Sling (at all times) Restrictions Weight Bearing Restrictions: Yes LUE Weight Bearing: Non weight bearing   Pertinent Vitals/Pain 7/10 L UE, O2  SATs >90% during mobility with O2, drops to 83% on room air during transition from bathroom to recliner     ADL  Grooming: Performed;Wash/dry hands;Wash/dry face;Minimal assistance Where Assessed - Grooming: Supported standing Upper Body Bathing: Simulated;Maximal assistance;Moderate assistance Where Assessed - Upper Body Bathing: Supported sitting Lower Body Bathing: Simulated;+1 Total assistance Upper Body Dressing: Performed;Maximal assistance Lower Body Dressing: +1 Total assistance Toilet Transfer: Performed;Minimal assistance Toilet Transfer Method: Sit to stand Toilet Transfer Equipment: Raised toilet seat with arms (or 3-in-1 over toilet);Grab bars Toileting - Clothing Manipulation and Hygiene: Performed;+1 Total assistance Where Assessed - Toileting Clothing Manipulation and Hygiene: Standing Tub/Shower Transfer Method: Not assessed Equipment Used: Gait belt;Other (comment) (3 in 1) Transfers/Ambulation Related to ADLs: cues for safety, decreased balance requiring mod A to correct  ADL Comments: Pt provded with ADL sling/ADL education    OT Diagnosis: Generalized weakness;Acute pain  OT Problem List: Decreased strength;Decreased knowledge of use of DME or AE;Decreased range of motion;Impaired UE functional use;Decreased knowledge of precautions;Decreased activity tolerance;Impaired balance (sitting and/or standing);Pain OT Treatment Interventions: Self-care/ADL training;Therapeutic exercise;Patient/family education;Neuromuscular education;Balance training;Therapeutic activities;DME and/or AE instruction   OT Goals(Current goals can be found in the care plan section) Acute Rehab OT Goals Patient Stated Goal: to get better OT  Goal Formulation: With patient Time For Goal Achievement: 06/24/13 Potential to  Achieve Goals: Good ADL Goals Pt Will Perform Grooming: with min guard assist;standing Pt Will Perform Upper Body Bathing: sitting;with mod assist;with min assist Pt Will Perform Lower Body Bathing: with max assist;with mod assist Pt Will Perform Upper Body Dressing: with mod assist Pt Will Transfer to Toilet: with min guard assist;grab bars;ambulating (3 in 1 over toilet) Pt Will Perform Toileting - Clothing Manipulation and hygiene: with max assist;with mod assist;sitting/lateral leans;sit to/from stand Additional ADL Goal #1: Pt will perform A/AROM ROM of L hand and elbow  Additional ADL Goal #2: Pt will correctly verbalize and demo bathing and dressing techniques for L UE/sling wear  Visit Information  Last OT Received On: 06/17/13 Assistance Needed: +1 History of Present Illness: Maria Lucero is a 62 y.o. female who presents for preoperative history and physical with a diagnosis of LEFT PROXIMAL HUMERUS FRACTURE . Symptoms are rated as moderate to severe, and have been worsening.  This is significantly impairing activities of daily living.  She has elected for surgical management. She had acute onset pain, bruising, and has been completely unable to use the arm. Seen in the emergency room, referred to my office, and then we have gone through her primary care doctor for preoperative optimization, and are not proceeding with surgical intervention to optimize her long-term function.       Prior Functioning     Home Living Family/patient expects to be discharged to:: Private residence Living Arrangements: Spouse/significant other Available Help at Discharge: Family Type of Home: House Home Access: Ramped entrance Home Layout: One level Home Equipment: Environmental consultant - 2 wheels;Walker - 4 wheels;Wheelchair - manual Prior Function Level of Independence: Needs assistance ADL's / Homemaking Assistance Needed:  Husband does housekeeping. Pt states that hse does her own sponge baths and dressing (wears gowns, pajamas and socks) Communication Communication: No difficulties Dominant Hand: Right         Vision/Perception Vision - History Baseline Vision: Wears glasses only for reading Patient Visual Report: No change from baseline Perception Perception: Within Functional Limits   Cognition  Cognition Arousal/Alertness: Lethargic Behavior During Therapy: Anxious Overall Cognitive Status: Impaired/Different from baseline Area of Impairment: Orientation;Memory Orientation Level: Time;Place Memory: Decreased short-term memory    Extremity/Trunk Assessment Upper Extremity Assessment Upper Extremity Assessment: Overall WFL for tasks assessed;Generalized weakness;LUE deficits/detail LUE: Unable to fully assess due to pain;Unable to fully assess due to immobilization Lower Extremity Assessment Lower Extremity Assessment: Defer to PT evaluation RLE Deficits / Details: Strength 4-/5 Cervical / Trunk Assessment Cervical / Trunk Assessment: Normal     Mobility Bed Mobility Bed Mobility: Not assessed Details for Bed Mobility Assistance: Pt up with PT upon entering room Transfers Transfers: Sit to Stand;Stand to Sit Sit to Stand: 4: Min assist;From bed;From toilet;From chair/3-in-1;With upper extremity assist;With armrests Stand to Sit: To bed;To toilet;To chair/3-in-1;4: Min assist;With upper extremity assist;With armrests Details for Transfer Assistance: cues for safety, decreased balance requiring mod A to correct      Exercise Donning/doffing shirt without moving shoulder: Maximal assistance Method for sponge bathing under operated UE: Maximal assistance Donning/doffing sling/immobilizer: Maximal assistance Correct positioning of sling/immobilizer: Maximal assistance ROM for elbow, wrist and digits of operated UE: Moderate assistance Sling wearing schedule (on at all times/off for  ADL's): Supervision/safety Positioning of UE while sleeping: Supervision/safety   Balance Balance Balance Assessed: Yes Static Sitting Balance Static Sitting - Balance Support: Feet unsupported;Left upper extremity supported;Right upper extremity supported Static Sitting - Level of Assistance: Other (comment) (min guard A) Dynamic Sitting  Balance Dynamic Sitting - Level of Assistance: 4: Min assist Static Standing Balance Static Standing - Level of Assistance: 4: Min assist Dynamic Standing Balance Dynamic Standing - Balance Support: Left upper extremity supported;Right upper extremity supported;During functional activity Dynamic Standing - Level of Assistance: 4: Min assist;3: Mod assist   End of Session OT - End of Session Equipment Utilized During Treatment: Gait belt Activity Tolerance: Patient limited by fatigue;Patient limited by pain Patient left: in bed;with call bell/phone within reach  GO     Galen Manila 06/17/2013, 1:07 PM

## 2013-06-19 ENCOUNTER — Encounter (HOSPITAL_COMMUNITY): Payer: Self-pay | Admitting: Emergency Medicine

## 2013-06-19 ENCOUNTER — Inpatient Hospital Stay (HOSPITAL_COMMUNITY)
Admission: EM | Admit: 2013-06-19 | Discharge: 2013-06-30 | DRG: 871 | Disposition: A | Discharge status: Hospice | Payer: Medicare HMO | Attending: Internal Medicine | Admitting: Internal Medicine

## 2013-06-19 ENCOUNTER — Emergency Department (HOSPITAL_COMMUNITY): Payer: Medicare HMO

## 2013-06-19 DIAGNOSIS — F411 Generalized anxiety disorder: Secondary | ICD-10-CM | POA: Diagnosis present

## 2013-06-19 DIAGNOSIS — K219 Gastro-esophageal reflux disease without esophagitis: Secondary | ICD-10-CM | POA: Diagnosis present

## 2013-06-19 DIAGNOSIS — T17508A Unspecified foreign body in bronchus causing other injury, initial encounter: Secondary | ICD-10-CM | POA: Diagnosis present

## 2013-06-19 DIAGNOSIS — G4700 Insomnia, unspecified: Secondary | ICD-10-CM

## 2013-06-19 DIAGNOSIS — R042 Hemoptysis: Secondary | ICD-10-CM

## 2013-06-19 DIAGNOSIS — D649 Anemia, unspecified: Secondary | ICD-10-CM | POA: Diagnosis present

## 2013-06-19 DIAGNOSIS — I5023 Acute on chronic systolic (congestive) heart failure: Secondary | ICD-10-CM | POA: Diagnosis present

## 2013-06-19 DIAGNOSIS — S42209D Unspecified fracture of upper end of unspecified humerus, subsequent encounter for fracture with routine healing: Secondary | ICD-10-CM

## 2013-06-19 DIAGNOSIS — G43909 Migraine, unspecified, not intractable, without status migrainosus: Secondary | ICD-10-CM

## 2013-06-19 DIAGNOSIS — E785 Hyperlipidemia, unspecified: Secondary | ICD-10-CM

## 2013-06-19 DIAGNOSIS — IMO0001 Reserved for inherently not codable concepts without codable children: Secondary | ICD-10-CM

## 2013-06-19 DIAGNOSIS — R222 Localized swelling, mass and lump, trunk: Secondary | ICD-10-CM

## 2013-06-19 DIAGNOSIS — Z79899 Other long term (current) drug therapy: Secondary | ICD-10-CM

## 2013-06-19 DIAGNOSIS — J9600 Acute respiratory failure, unspecified whether with hypoxia or hypercapnia: Secondary | ICD-10-CM | POA: Diagnosis present

## 2013-06-19 DIAGNOSIS — L509 Urticaria, unspecified: Secondary | ICD-10-CM

## 2013-06-19 DIAGNOSIS — M129 Arthropathy, unspecified: Secondary | ICD-10-CM | POA: Diagnosis present

## 2013-06-19 DIAGNOSIS — J9602 Acute respiratory failure with hypercapnia: Secondary | ICD-10-CM | POA: Diagnosis present

## 2013-06-19 DIAGNOSIS — Z515 Encounter for palliative care: Secondary | ICD-10-CM

## 2013-06-19 DIAGNOSIS — Z885 Allergy status to narcotic agent status: Secondary | ICD-10-CM

## 2013-06-19 DIAGNOSIS — G9340 Encephalopathy, unspecified: Secondary | ICD-10-CM | POA: Diagnosis present

## 2013-06-19 DIAGNOSIS — J441 Chronic obstructive pulmonary disease with (acute) exacerbation: Secondary | ICD-10-CM | POA: Diagnosis present

## 2013-06-19 DIAGNOSIS — G8929 Other chronic pain: Secondary | ICD-10-CM | POA: Diagnosis present

## 2013-06-19 DIAGNOSIS — I2789 Other specified pulmonary heart diseases: Secondary | ICD-10-CM | POA: Diagnosis present

## 2013-06-19 DIAGNOSIS — J189 Pneumonia, unspecified organism: Secondary | ICD-10-CM | POA: Diagnosis present

## 2013-06-19 DIAGNOSIS — E872 Acidosis, unspecified: Secondary | ICD-10-CM | POA: Diagnosis present

## 2013-06-19 DIAGNOSIS — E7800 Pure hypercholesterolemia, unspecified: Secondary | ICD-10-CM

## 2013-06-19 DIAGNOSIS — C3490 Malignant neoplasm of unspecified part of unspecified bronchus or lung: Secondary | ICD-10-CM | POA: Diagnosis present

## 2013-06-19 DIAGNOSIS — F319 Bipolar disorder, unspecified: Secondary | ICD-10-CM | POA: Diagnosis present

## 2013-06-19 DIAGNOSIS — J9690 Respiratory failure, unspecified, unspecified whether with hypoxia or hypercapnia: Secondary | ICD-10-CM

## 2013-06-19 DIAGNOSIS — I509 Heart failure, unspecified: Secondary | ICD-10-CM | POA: Diagnosis present

## 2013-06-19 DIAGNOSIS — I10 Essential (primary) hypertension: Secondary | ICD-10-CM | POA: Diagnosis present

## 2013-06-19 DIAGNOSIS — IMO0002 Reserved for concepts with insufficient information to code with codable children: Secondary | ICD-10-CM | POA: Diagnosis present

## 2013-06-19 DIAGNOSIS — F339 Major depressive disorder, recurrent, unspecified: Secondary | ICD-10-CM

## 2013-06-19 DIAGNOSIS — Z981 Arthrodesis status: Secondary | ICD-10-CM

## 2013-06-19 DIAGNOSIS — Z9889 Other specified postprocedural states: Secondary | ICD-10-CM

## 2013-06-19 DIAGNOSIS — A419 Sepsis, unspecified organism: Principal | ICD-10-CM | POA: Diagnosis present

## 2013-06-19 DIAGNOSIS — Z87891 Personal history of nicotine dependence: Secondary | ICD-10-CM

## 2013-06-19 DIAGNOSIS — J690 Pneumonitis due to inhalation of food and vomit: Secondary | ICD-10-CM

## 2013-06-19 DIAGNOSIS — I214 Non-ST elevation (NSTEMI) myocardial infarction: Secondary | ICD-10-CM | POA: Diagnosis not present

## 2013-06-19 DIAGNOSIS — Z923 Personal history of irradiation: Secondary | ICD-10-CM

## 2013-06-19 DIAGNOSIS — Z9221 Personal history of antineoplastic chemotherapy: Secondary | ICD-10-CM

## 2013-06-19 DIAGNOSIS — J398 Other specified diseases of upper respiratory tract: Secondary | ICD-10-CM | POA: Diagnosis present

## 2013-06-19 DIAGNOSIS — E46 Unspecified protein-calorie malnutrition: Secondary | ICD-10-CM | POA: Diagnosis present

## 2013-06-19 DIAGNOSIS — M545 Low back pain: Secondary | ICD-10-CM

## 2013-06-19 DIAGNOSIS — R110 Nausea: Secondary | ICD-10-CM | POA: Diagnosis present

## 2013-06-19 LAB — POCT I-STAT 3, ART BLOOD GAS (G3+)
Acid-base deficit: 1 mmol/L (ref 0.0–2.0)
Bicarbonate: 24.8 mEq/L — ABNORMAL HIGH (ref 20.0–24.0)
Patient temperature: 97.3
TCO2: 26 mmol/L (ref 0–100)
pCO2 arterial: 45.3 mmHg — ABNORMAL HIGH (ref 35.0–45.0)
pH, Arterial: 7.342 — ABNORMAL LOW (ref 7.350–7.450)
pO2, Arterial: 232 mmHg — ABNORMAL HIGH (ref 80.0–100.0)

## 2013-06-19 LAB — POCT I-STAT TROPONIN I: Troponin i, poc: 0.14 ng/mL (ref 0.00–0.08)

## 2013-06-19 LAB — CG4 I-STAT (LACTIC ACID): Lactic Acid, Venous: 8.79 mmol/L — ABNORMAL HIGH (ref 0.5–2.2)

## 2013-06-19 MED ORDER — LIDOCAINE HCL (CARDIAC) 20 MG/ML IV SOLN
INTRAVENOUS | Status: AC
  Filled 2013-06-19: qty 5

## 2013-06-19 MED ORDER — VANCOMYCIN HCL IN DEXTROSE 1-5 GM/200ML-% IV SOLN
1000.0000 mg | INTRAVENOUS | Status: AC
  Filled 2013-06-19: qty 200

## 2013-06-19 MED ORDER — ONDANSETRON HCL 4 MG/2ML IJ SOLN
INTRAMUSCULAR | Status: AC
  Administered 2013-06-19: 4 mg via INTRAMUSCULAR
  Filled 2013-06-19: qty 2

## 2013-06-19 MED ORDER — ONDANSETRON HCL 4 MG/2ML IJ SOLN
4.0000 mg | Freq: Once | INTRAMUSCULAR | Status: AC
  Administered 2013-06-19: 4 mg via INTRAMUSCULAR

## 2013-06-19 MED ORDER — DEXTROSE 5 % IV SOLN
1.0000 g | Freq: Three times a day (TID) | INTRAVENOUS | Status: DC
  Administered 2013-06-20 – 2013-06-30 (×32): 1 g via INTRAVENOUS
  Filled 2013-06-19 (×39): qty 1

## 2013-06-19 MED ORDER — DEXTROSE 5 % IV SOLN
1.0000 g | Freq: Once | INTRAVENOUS | Status: AC
  Administered 2013-06-20: 1 g via INTRAVENOUS
  Filled 2013-06-19: qty 1

## 2013-06-19 MED ORDER — DEXAMETHASONE SODIUM PHOSPHATE 10 MG/ML IJ SOLN
10.0000 mg | Freq: Once | INTRAMUSCULAR | Status: AC
  Administered 2013-06-19: 10 mg via INTRAMUSCULAR

## 2013-06-19 MED ORDER — ALBUTEROL (5 MG/ML) CONTINUOUS INHALATION SOLN
10.0000 mg/h | INHALATION_SOLUTION | Freq: Once | RESPIRATORY_TRACT | Status: AC
  Administered 2013-06-20: 10 mg/h via RESPIRATORY_TRACT
  Filled 2013-06-19: qty 40

## 2013-06-19 MED ORDER — ROCURONIUM BROMIDE 50 MG/5ML IV SOLN
INTRAVENOUS | Status: AC
  Filled 2013-06-19: qty 2

## 2013-06-19 MED ORDER — ETOMIDATE 2 MG/ML IV SOLN
INTRAVENOUS | Status: AC
  Administered 2013-06-20: 20 mg via INTRAVENOUS
  Filled 2013-06-19: qty 20

## 2013-06-19 MED ORDER — SODIUM CHLORIDE 0.9 % IV BOLUS (SEPSIS)
1000.0000 mL | Freq: Once | INTRAVENOUS | Status: AC
  Administered 2013-06-20: 1000 mL via INTRAVENOUS

## 2013-06-19 MED ORDER — DEXAMETHASONE SODIUM PHOSPHATE 10 MG/ML IJ SOLN
INTRAMUSCULAR | Status: AC
  Administered 2013-06-19: 10 mg via INTRAMUSCULAR
  Filled 2013-06-19: qty 1

## 2013-06-19 MED ORDER — ONDANSETRON HCL 4 MG/2ML IJ SOLN
INTRAMUSCULAR | Status: AC
  Filled 2013-06-19: qty 2

## 2013-06-19 MED ORDER — SUCCINYLCHOLINE CHLORIDE 20 MG/ML IJ SOLN
INTRAMUSCULAR | Status: AC
  Administered 2013-06-20: 150 mg via INTRAVENOUS
  Filled 2013-06-19: qty 1

## 2013-06-19 MED ORDER — VANCOMYCIN HCL IN DEXTROSE 750-5 MG/150ML-% IV SOLN
750.0000 mg | Freq: Two times a day (BID) | INTRAVENOUS | Status: DC
  Administered 2013-06-20 – 2013-06-22 (×7): 750 mg via INTRAVENOUS
  Filled 2013-06-19 (×9): qty 150

## 2013-06-19 NOTE — Progress Notes (Signed)
ANTIBIOTIC CONSULT NOTE - INITIAL  Pharmacy Consult for vancomycin and cefepime Indication: HAP ARDS  Allergies  Allergen Reactions  . Methocarbamol Nausea And Vomiting  . Morphine Sulfate Er Beads Other (See Comments)    confusion  . Aspirin Nausea And Vomiting  . Clarithromycin Other (See Comments)    Stomach problems   . Codeine Nausea And Vomiting  . Lyrica [Pregabalin] Other (See Comments)    Weight gain   . Morphine Other (See Comments)    Passed out  . Levaquin [Levofloxacin] Rash    Patient Measurements: Height: 5\' 5"  (165.1 cm) Weight: 148 lb (67.132 kg) IBW/kg (Calculated) : 57 Adjusted Body Weight:   Vital Signs: Temp: 97.3 F (36.3 C) (12/22 2339) Temp src: Axillary (12/22 2339) BP: 122/95 mmHg (12/22 2330) Pulse Rate: 131 (12/22 2330) Intake/Output from previous day:   Intake/Output from this shift:    Labs:  Recent Labs  06/17/13 0455  WBC 5.9  HGB 8.2*  PLT 310  CREATININE 0.56   Estimated Creatinine Clearance: 63.9 ml/min (by C-G formula based on Cr of 0.56). No results found for this basename: VANCOTROUGH, VANCOPEAK, VANCORANDOM, GENTTROUGH, GENTPEAK, GENTRANDOM, TOBRATROUGH, TOBRAPEAK, TOBRARND, AMIKACINPEAK, AMIKACINTROU, AMIKACIN,  in the last 72 hours   Microbiology: No results found for this or any previous visit (from the past 720 hour(s)).  Medical History: Past Medical History  Diagnosis Date  . Migraine headache   . Anxiety   . Urticaria   . Low back pain syndrome   . Hyperlipidemia   . Hypercholesterolemia   . Depression   . Tobacco abuse   . Myalgia and myositis, unspecified   . Bipolar 1 disorder   . Allergy   . Asthma     per pt stated  . Blood transfusion     several x  . Cataract     recent surgery b/l  . Arthritis   . History of radiation therapy 04/11/12; 04/13/12; 04/15/12; 04/18/12; & 04/20/12    Rll lung 60Gy/5/fx  . GERD (gastroesophageal reflux disease)   . COPD (chronic obstructive pulmonary  disease)     home o2 at 2l/min  . Shortness of breath     home o2 at 2l/min  . lung ca dx'd 11/2009    chemo comp 05/2010,home o2 at 2l/min  . Lung cancer     invasive left upper lobe adenocarcinoma  . Complication of anesthesia     low oxygen  . PONV (postoperative nausea and vomiting)   . Fracture of humerus, proximal, left, closed 06/16/2013    Medications:   (Not in a hospital admission) Assessment: DC 3 days ago for ORIF of left humerus. Now presenting with resp dist on 2L Nasal cannula with sats in 60's. vanc and cefepime for empiric HAP coverage.   Goal of Therapy:  Vancomycin trough level 15-20 mcg/ml  Plan:  vanc 1gm now then 750 mg q12 h  And continue  cefepime 1gm q8h    Janice Coffin 06/19/2013,11:52 PM

## 2013-06-19 NOTE — ED Provider Notes (Signed)
CSN: [id number]     Arrival date & time 06/19/13  2302 History   First MD Initiated Contact with Patient 06/19/13 2315     Chief Complaint  Patient presents with  . Shortness of Breath    HPI  Patient was a multitude of past medical history. Most notably a recent fracture and ORIF of her left humerus. Discharge 3 days ago. Has history of COPD and lung disease. History of lung cancer. Presents restaurant distress. Increasing shortness of breath. Apparently on 2 L nasal cannula: Saturations in the 60s. Given continuous albuterol neb en route. Presents her diaphoretic dyspneic one to 2 word dyspnea. Became nauseated and did not tolerate CPAP en route. Denies any chest pain. History of present illness review of systems are limited by the patient's acuity and difficulty breathing.  Past Medical History  Diagnosis Date  . Migraine headache   . Anxiety   . Urticaria   . Low back pain syndrome   . Hyperlipidemia   . Hypercholesterolemia   . Depression   . Tobacco abuse   . Myalgia and myositis, unspecified   . Bipolar 1 disorder   . Allergy   . Asthma     per pt stated  . Blood transfusion     several x  . Cataract     recent surgery b/l  . Arthritis   . History of radiation therapy 04/11/12; 04/13/12; 04/15/12; 04/18/12; & 04/20/12    Rll lung 60Gy/5/fx  . GERD (gastroesophageal reflux disease)   . COPD (chronic obstructive pulmonary disease)     home o2 at 2l/min  . Shortness of breath     home o2 at 2l/min  . lung ca dx'd 11/2009    chemo comp 05/2010,home o2 at 2l/min  . Lung cancer     invasive left upper lobe adenocarcinoma  . Complication of anesthesia     low oxygen  . PONV (postoperative nausea and vomiting)   . Fracture of humerus, proximal, left, closed 06/16/2013   Past Surgical History  Procedure Laterality Date  . Back surgery      x 4 L3-S1 1996,2000, 2002  . Vesicovaginal fistula closure w/ tah    . Appendectomy    . Bronchoscopy  2011  . Lung biopsy   2011    needle  . Tonsillectomy    . Abdominal hysterectomy      with b/l oophoorectomy age 62  . Lumbar fusion     Family History  Problem Relation Age of Onset  . Heart disease      paternal side  . Rheum arthritis Mother   . Lung cancer    . Colon cancer    . Brain cancer    . Kidney cancer Maternal Grandmother    History  Substance Use Topics  . Smoking status: Former Smoker -- 1.00 packs/day for 51 years    Types: Cigarettes    Quit date: 06/29/2008  . Smokeless tobacco: Never Used     Comment: Electronic cig-no nicotine-vapor  . Alcohol Use: No   OB History   Grav Para Term Preterm Abortions TAB SAB Ect Mult Living                 Review of Systems  Unable to perform ROS: Acuity of condition    Allergies  Methocarbamol; Morphine sulfate er beads; Aspirin; Clarithromycin; Codeine; Lyrica; Morphine; and Levaquin  Home Medications   Current Outpatient Rx  Name  Route  Sig  Dispense  Refill  .  albuterol (PROVENTIL) (2.5 MG/3ML) 0.083% nebulizer solution   Nebulization   Take 2.5 mg by nebulization every 6 (six) hours as needed for wheezing.         Marland Kitchen buPROPion (WELLBUTRIN) 100 MG tablet   Oral   Take 100 mg by mouth 2 (two) times daily.         . clonazePAM (KLONOPIN) 1 MG tablet   Oral   Take 1-2 mg by mouth 3 (three) times daily.          . divalproex (DEPAKOTE) 250 MG DR tablet   Oral   Take 750 mg by mouth at bedtime.         . DULoxetine (CYMBALTA) 60 MG capsule   Oral   Take 60 mg by mouth daily before breakfast.          . folic acid (FOLVITE) 1 MG tablet   Oral   Take 1 mg by mouth daily.         . hydroxychloroquine (PLAQUENIL) 200 MG tablet   Oral   Take 400 mg by mouth daily.          Marland Kitchen omeprazole (PRILOSEC) 20 MG capsule   Oral   Take 20 mg by mouth daily.           Marland Kitchen oxyCODONE-acetaminophen (PERCOCET) 10-325 MG per tablet   Oral   Take 1-2 tablets by mouth every 6 (six) hours as needed. Pain   75 tablet   0    . promethazine (PHENERGAN) 25 MG tablet   Oral   Take 1 tablet (25 mg total) by mouth every 6 (six) hours as needed for nausea or vomiting.   30 tablet   0   . QUEtiapine (SEROQUEL) 25 MG tablet   Oral   Take 25 mg by mouth at bedtime.           . simvastatin (ZOCOR) 40 MG tablet   Oral   Take 40 mg by mouth daily.          . SUMAtriptan (IMITREX) 100 MG tablet   Oral   Take 100 mg by mouth every 2 (two) hours as needed. Headache          . traMADol (ULTRAM) 50 MG tablet   Oral   Take 50 mg by mouth every 6 (six) hours as needed for pain.         Marland Kitchen triamcinolone cream (KENALOG) 0.1 %   Topical   Apply 1 application topically 2 (two) times daily as needed (for itching).           BP 90/74  Pulse 101  Temp(Src) 97.3 F (36.3 C) (Axillary)  Resp 16  Ht 5\' 5"  (1.651 m)  Wt 148 lb (67.132 kg)  BMI 24.63 kg/m2  SpO2 100% Physical Exam  Constitutional: She appears ill. She appears distressed.  HENT:  No JVD. Conjunctiva not pale.  Eyes:  No scleral icterus or pale conjunctivae  Neck:  No JVD  Cardiovascular:  Tachycardic but regular.  Pulmonary/Chest:  Marked diminished right-sided breath sounds. Globally diminished breath sounds and prolongation on the left.  Abdominal:  Abdomen soft.  Musculoskeletal:       Arms: Skin:  Skin is pale diaphoretic  Psychiatric:  Anxious.    ED Course  CRITICAL CARE Performed by: Rolland Porter Authorized by: Rolland Porter Total critical care time: 90 minutes Critical care start time: 06/20/2013 1:20 AM Critical care end time: 06/20/2013 2:50 AM Critical care time was  exclusive of separately billable procedures and treating other patients. Critical care was necessary to treat or prevent imminent or life-threatening deterioration of the following conditions: respiratory failure. Critical care was time spent personally by me on the following activities: blood draw for specimens, discussions with consultants, evaluation  of patient's response to treatment, examination of patient, obtaining history from patient or surrogate, ordering and performing treatments and interventions, ordering and review of laboratory studies, ordering and review of radiographic studies, pulse oximetry, re-evaluation of patient's condition, review of old charts, ventilator management and vascular access procedures.  INTUBATION Date/Time: 06/20/2013 2:40 AM Performed by: Rolland Porter Authorized by: Rolland Porter Consent: Verbal consent obtained. written consent not obtained. Consent given by: patient and spouse Patient understanding: patient states understanding of the procedure being performed Patient identity confirmed: arm band and verbally with patient Indications: respiratory distress and respiratory failure Intubation method: direct Patient status: paralyzed (RSI) Preoxygenation: nonrebreather mask and BVM (BiPAP) Sedatives: etomidate Paralytic: succinylcholine Laryngoscope size: Miller 3 Tube size: 8.0 mm Tube type: cuffed Number of attempts: 1 Cricoid pressure: yes Cords visualized: yes Post-procedure assessment: chest rise and ETCO2 monitor Breath sounds: equal Cuff inflated: yes ETT to lip: 19 cm Tube secured with: ETT holder Chest x-ray interpreted by me. Chest x-ray findings: endotracheal tube in appropriate position Patient tolerance: Patient tolerated the procedure well with no immediate complications.   (including critical care time) Labs Review Labs Reviewed  CBC WITH DIFFERENTIAL - Abnormal; Notable for the following:    WBC 13.2 (*)    RBC 2.91 (*)    Hemoglobin 8.9 (*)    HCT 28.7 (*)    Platelets 494 (*)    Eosinophils Relative 6 (*)    Lymphs Abs 4.2 (*)    Eosinophils Absolute 0.8 (*)    All other components within normal limits  BASIC METABOLIC PANEL - Abnormal; Notable for the following:    Chloride 92 (*)    CO2 18 (*)    Glucose, Bld 302 (*)    All other components within normal limits    CG4 I-STAT (LACTIC ACID) - Abnormal; Notable for the following:    Lactic Acid, Venous 8.79 (*)    All other components within normal limits  POCT I-STAT TROPONIN I - Abnormal; Notable for the following:    Troponin i, poc 0.14 (*)    All other components within normal limits  POCT I-STAT 3, BLOOD GAS (G3+) - Abnormal; Notable for the following:    pH, Arterial 7.342 (*)    pCO2 arterial 45.3 (*)    pO2, Arterial 232.0 (*)    Bicarbonate 24.8 (*)    All other components within normal limits  POCT I-STAT TROPONIN I - Abnormal; Notable for the following:    Troponin i, poc 0.30 (*)    All other components within normal limits  CULTURE, BLOOD (ROUTINE X 2)  CULTURE, BLOOD (ROUTINE X 2)  CULTURE, RESPIRATORY (NON-EXPECTORATED)  BLOOD GAS, ARTERIAL  URINALYSIS, ROUTINE W REFLEX MICROSCOPIC   Imaging Review Ct Angio Chest Pe W/cm &/or Wo Cm  06/20/2013   CLINICAL DATA:  Hypoxemia; hypotensive.  EXAM: CT ANGIOGRAPHY CHEST WITH CONTRAST  TECHNIQUE: Multidetector CT imaging of the chest was performed using the standard protocol during bolus administration of intravenous contrast. Multiplanar CT image reconstructions including MIPs were obtained to evaluate the vascular anatomy.  CONTRAST:  OMNIPAQUE IOHEXOL 350 MG/ML SOLN  COMPARISON:  CT of the chest performed 12/26/2012, and chest radiograph performed 06/19/2013  FINDINGS: There  is no evidence of pulmonary embolus. Evaluation for pulmonary embolus is suboptimal in areas of airspace opacification.  A prominent spiculated hazy opacity at the right lower lobe is slightly less well defined than on the prior study, with somewhat increased architectural distortion. As before, this raises concern for bronchogenic carcinoma. There is now a trace right pleural effusion. The previously noted nodule along the right major fissure now measures 0.8 cm, increased in size from the prior study, and a new 0.9 cm nodule is seen more inferiorly along the right  major fissure. Dense consolidation and volume loss at the left upper lobe is grossly unchanged in appearance. Underlying emphysematous change is again noted bilaterally. There is no evidence of pneumothorax.  A small pericardial effusion is noted, similar in appearance to the prior study. The mediastinum is otherwise unremarkable in appearance. No mediastinal lymphadenopathy is seen. The great vessels are grossly unremarkable in appearance. No axillary lymphadenopathy is seen. The thyroid gland is unremarkable in appearance.  The visualized portions of the liver and spleen are unremarkable.  No acute osseous abnormalities are seen. Postoperative change is noted along the proximal left humerus. A right-sided chest port is noted.  Review of the MIP images confirms the above findings.  IMPRESSION: 1. No evidence for pulmonary embolus. 2. Prominent spiculated hazy opacity at the right lower lung lobe is slightly less well defined, with somewhat increased architectural distortion. As before, this is suspicious for bronchogenic carcinoma. There is now a trace right pleural effusion. Nodules along the right major fissure have also increased in size, or are new from the prior study, raising concern for satellite nodules. 3. Stable chronic dense consolidation involving most of the left upper lung lobe. 4. Bilateral emphysematous change again seen. 5. Stable small pericardial effusion noted.   Electronically Signed   By: Roanna Raider M.D.   On: 06/20/2013 01:35   Dg Chest Port 1 View  06/20/2013   CLINICAL DATA:  Evaluate endotracheal tube  EXAM: PORTABLE CHEST - 1 VIEW  COMPARISON:  Prior CT from the same day as well as earlier chest radiographs.  FINDINGS: Axis right Port-A-Cath is stable in position. Tip of the endotracheal tube is positioned 5.0 cm above the carina. Cardiac silhouette is unchanged.  New dense opacity within the left upper lobe with associated architectural distortion and volume loss is Stable the  prior. Hazy right lower lobe opacities again noted, unchanged. Emphysematous changes noted. The left lower lobe appears largely collapsed. No pneumothorax.  Fixation hardware seen within the thoracolumbar spine, partially visualized. Sequelae of prior ORIF again noted within the proximal left humerus. No acute osseous abnormality.  IMPRESSION: 1. Tip of the endotracheal tube 5.0 cm above the carina. 2. Otherwise stable appearance of the chest with chronic changes of the left hemi thorax and diffuse hazy opacity within the right lung base.   Electronically Signed   By: Rise Mu M.D.   On: 06/20/2013 02:46   Dg Chest Port 1 View  06/19/2013   CLINICAL DATA:  Short of breath.  EXAM: PORTABLE CHEST - 1 VIEW  COMPARISON:  06/15/2013.  FINDINGS: Chronic changes of the left hemithorax are present. There is new airspace disease at the right lung base which may represent aspiration, asymmetric pulmonary edema or pneumonia. Cardiopericardial silhouette appears similar. Power port from right IJ approach again noted, unchanged.  IMPRESSION: New right basilar airspace disease favored to represent aspiration or pneumonia. Asymmetric/atypical pulmonary edema is in the differential considerations.   Electronically Signed  By: Andreas Newport M.D.   On: 06/19/2013 23:40    EKG Interpretation    Date/Time:  Monday June 19 2013 23:04:21 EST Ventricular Rate:  116 PR Interval:  170 QRS Duration: 89 QT Interval:  341 QTC Calculation: 474 R Axis:   94 Text Interpretation:  Sinus tachycardia Right axis deviation Borderline T wave abnormalities Confirmed by Fayrene Fearing  MD, Akshita Italiano ([id number]) on 06/19/2013 11:44:35 PM            MDM   1. Respiratory failure   2. COPD exacerbation   3. Lung cancer, unspecified laterality     Arrival given IM Zofran IM Decadron or IV access was obtained. This able to be successfully placed on a CPAP at least temporarily. ABGs been obtained. X-ray is pending. Differential  diagnosis a COPD exacerbation. With her lung cancer recent surgery pulmonary embolus as a possibility. With her diminished breath sounds on the right pneumonia and effusion are in the differential as well.    Rolland Porter, MD 06/20/13 [phone]

## 2013-06-19 NOTE — ED Notes (Signed)
Lactic Acid results reported to Dr.James 

## 2013-06-19 NOTE — ED Notes (Signed)
EMS called to house for Pt. C/O of shortness of breath. Pt. States that today she started having shortness of breath and it has continued all day. Just had sur grey this past Friday.  Albuterol 5mg . En route then attempted CPAP. Pt. Improved with it but then became nauseated and could not tolerate CPAP.

## 2013-06-19 NOTE — ED Notes (Signed)
Critical troponin reported to Dr.James

## 2013-06-20 ENCOUNTER — Inpatient Hospital Stay (HOSPITAL_COMMUNITY): Payer: Medicare HMO

## 2013-06-20 ENCOUNTER — Emergency Department (HOSPITAL_COMMUNITY): Payer: Medicare HMO

## 2013-06-20 ENCOUNTER — Encounter (HOSPITAL_COMMUNITY): Payer: Self-pay | Admitting: Radiology

## 2013-06-20 DIAGNOSIS — C3490 Malignant neoplasm of unspecified part of unspecified bronchus or lung: Secondary | ICD-10-CM

## 2013-06-20 DIAGNOSIS — J9600 Acute respiratory failure, unspecified whether with hypoxia or hypercapnia: Secondary | ICD-10-CM

## 2013-06-20 DIAGNOSIS — J441 Chronic obstructive pulmonary disease with (acute) exacerbation: Secondary | ICD-10-CM

## 2013-06-20 DIAGNOSIS — I319 Disease of pericardium, unspecified: Secondary | ICD-10-CM

## 2013-06-20 DIAGNOSIS — J690 Pneumonitis due to inhalation of food and vomit: Secondary | ICD-10-CM

## 2013-06-20 LAB — URINE MICROSCOPIC-ADD ON

## 2013-06-20 LAB — CBC WITH DIFFERENTIAL/PLATELET
Basophils Absolute: 0.1 10*3/uL (ref 0.0–0.1)
Basophils Relative: 1 % (ref 0–1)
Eosinophils Relative: 6 % — ABNORMAL HIGH (ref 0–5)
HCT: 28.7 % — ABNORMAL LOW (ref 36.0–46.0)
Hemoglobin: 8.9 g/dL — ABNORMAL LOW (ref 12.0–15.0)
Lymphocytes Relative: 32 % (ref 12–46)
MCV: 98.6 fL (ref 78.0–100.0)
Monocytes Relative: 7 % (ref 3–12)
Neutro Abs: 7.2 10*3/uL (ref 1.7–7.7)
RBC: 2.91 MIL/uL — ABNORMAL LOW (ref 3.87–5.11)
WBC: 13.2 10*3/uL — ABNORMAL HIGH (ref 4.0–10.5)

## 2013-06-20 LAB — BASIC METABOLIC PANEL
CO2: 18 mEq/L — ABNORMAL LOW (ref 19–32)
Calcium: 8.7 mg/dL (ref 8.4–10.5)
Calcium: 7.5 mg/dL — ABNORMAL LOW (ref 8.4–10.5)
Chloride: 92 mEq/L — ABNORMAL LOW (ref 96–112)
GFR calc Af Amer: >90 mL/min (ref >90)
GFR calc non Af Amer: >90 mL/min (ref >90)
GFR calc non Af Amer: >90 mL/min (ref >90)
Potassium: 4 mEq/L (ref 3.5–5.1)
Potassium: 3.3 mEq/L — ABNORMAL LOW (ref 3.5–5.1)
Sodium: 135 mEq/L (ref 135–145)
Sodium: 140 mEq/L (ref 135–145)

## 2013-06-20 LAB — MAGNESIUM: Magnesium: 1.7 mg/dL (ref 1.5–2.5)

## 2013-06-20 LAB — POCT I-STAT TROPONIN I: Troponin i, poc: 0.3 ng/mL (ref 0.00–0.08)

## 2013-06-20 LAB — CBC
MCH: 30.6 pg (ref 26.0–34.0)
MCHC: 31.4 g/dL (ref 30.0–36.0)
RDW: 14.7 % (ref 11.5–15.5)

## 2013-06-20 LAB — GLUCOSE, CAPILLARY
Glucose-Capillary: 163 mg/dL — ABNORMAL HIGH (ref 70–99)
Glucose-Capillary: 213 mg/dL — ABNORMAL HIGH (ref 70–99)
Glucose-Capillary: 129 mg/dL — ABNORMAL HIGH (ref 70–99)
Glucose-Capillary: 109 mg/dL — ABNORMAL HIGH (ref 70–99)

## 2013-06-20 LAB — INFLUENZA PANEL BY PCR (TYPE A & B)
H1N1 flu by pcr: NOT DETECTED
Influenza A By PCR: NEGATIVE
Influenza B By PCR: NEGATIVE

## 2013-06-20 LAB — POCT I-STAT 3, ART BLOOD GAS (G3+)
Acid-base deficit: 3 mmol/L — ABNORMAL HIGH (ref 0.0–2.0)
O2 Saturation: 53 %
TCO2: 25 mmol/L (ref 0–100)
pCO2 arterial: 48.8 mmHg — ABNORMAL HIGH (ref 35.0–45.0)
pO2, Arterial: 31 mmHg — CL (ref 80.0–100.0)

## 2013-06-20 LAB — PHOSPHORUS: Phosphorus: 3.7 mg/dL (ref 2.3–4.6)

## 2013-06-20 LAB — URINALYSIS, ROUTINE W REFLEX MICROSCOPIC
Hgb urine dipstick: NEGATIVE
Leukocytes, UA: NEGATIVE
Nitrite: NEGATIVE
Protein, ur: 30 mg/dL — AB
Specific Gravity, Urine: 1.02 (ref 1.005–1.030)
Urobilinogen, UA: 1 mg/dL (ref 0.0–1.0)

## 2013-06-20 LAB — LEGIONELLA ANTIGEN, URINE: Legionella Antigen, Urine: NEGATIVE

## 2013-06-20 LAB — MRSA PCR SCREENING: MRSA by PCR: NEGATIVE

## 2013-06-20 LAB — STREP PNEUMONIAE URINARY ANTIGEN: Strep Pneumo Urinary Antigen: NEGATIVE

## 2013-06-20 LAB — TROPONIN I: Troponin I: 0.68 ng/mL (ref <0.30)

## 2013-06-20 MED ORDER — HYDROXYCHLOROQUINE SULFATE 200 MG PO TABS
400.0000 mg | ORAL_TABLET | Freq: Every day | ORAL | Status: DC
  Administered 2013-06-20 – 2013-06-30 (×11): 400 mg via ORAL
  Filled 2013-06-20 (×12): qty 2

## 2013-06-20 MED ORDER — NOREPINEPHRINE BITARTRATE 1 MG/ML IJ SOLN
5.0000 ug/min | INTRAVENOUS | Status: DC
  Administered 2013-06-20: 15 ug/min via INTRAVENOUS
  Filled 2013-06-20: qty 4

## 2013-06-20 MED ORDER — SODIUM CHLORIDE 0.9 % IV SOLN
INTRAVENOUS | Status: DC
  Administered 2013-06-20: 04:00:00 via INTRAVENOUS

## 2013-06-20 MED ORDER — ALBUTEROL SULFATE (5 MG/ML) 0.5% IN NEBU
2.5000 mg | INHALATION_SOLUTION | Freq: Four times a day (QID) | RESPIRATORY_TRACT | Status: DC
  Administered 2013-06-20 – 2013-06-22 (×9): 2.5 mg via RESPIRATORY_TRACT
  Filled 2013-06-20 (×8): qty 0.5

## 2013-06-20 MED ORDER — METHYLPREDNISOLONE SODIUM SUCC 125 MG IJ SOLR
60.0000 mg | Freq: Four times a day (QID) | INTRAMUSCULAR | Status: DC
  Administered 2013-06-20 – 2013-06-22 (×9): 60 mg via INTRAVENOUS
  Filled 2013-06-20 (×4): qty 0.96
  Filled 2013-06-20: qty 2
  Filled 2013-06-20 (×9): qty 0.96

## 2013-06-20 MED ORDER — SODIUM CHLORIDE 0.9 % IV BOLUS (SEPSIS): 1000.0000 mL | INTRAVENOUS | Status: DC | PRN

## 2013-06-20 MED ORDER — MIDAZOLAM HCL 2 MG/2ML IJ SOLN: 2.0000 mg | INTRAMUSCULAR | Status: DC | PRN

## 2013-06-20 MED ORDER — DULOXETINE HCL 60 MG PO CPEP
60.0000 mg | ORAL_CAPSULE | Freq: Every day | ORAL | Status: DC
  Administered 2013-06-20: 60 mg via ORAL
  Filled 2013-06-20 (×3): qty 1

## 2013-06-20 MED ORDER — FENTANYL CITRATE 0.05 MG/ML IJ SOLN: 50.0000 ug | Freq: Once | INTRAMUSCULAR | Status: DC

## 2013-06-20 MED ORDER — INSULIN ASPART 100 UNIT/ML ~~LOC~~ SOLN
0.0000 [IU] | SUBCUTANEOUS | Status: DC
  Administered 2013-06-20: 3 [IU] via SUBCUTANEOUS
  Administered 2013-06-20: 5 [IU] via SUBCUTANEOUS
  Administered 2013-06-20 – 2013-06-22 (×8): 2 [IU] via SUBCUTANEOUS
  Administered 2013-06-22: 3 [IU] via SUBCUTANEOUS
  Administered 2013-06-22 (×3): 2 [IU] via SUBCUTANEOUS
  Administered 2013-06-23: 3 [IU] via SUBCUTANEOUS
  Administered 2013-06-23: 2 [IU] via SUBCUTANEOUS
  Administered 2013-06-23: 3 [IU] via SUBCUTANEOUS
  Administered 2013-06-24: 2 [IU] via SUBCUTANEOUS

## 2013-06-20 MED ORDER — BUPROPION HCL 100 MG PO TABS
100.0000 mg | ORAL_TABLET | Freq: Two times a day (BID) | ORAL | Status: DC
  Administered 2013-06-20 (×2): 100 mg via ORAL
  Filled 2013-06-20 (×4): qty 1

## 2013-06-20 MED ORDER — SODIUM CHLORIDE 0.9 % IV BOLUS (SEPSIS)
1000.0000 mL | Freq: Once | INTRAVENOUS | Status: AC
  Administered 2013-06-20: 1000 mL via INTRAVENOUS

## 2013-06-20 MED ORDER — ALBUTEROL SULFATE (2.5 MG/3ML) 0.083% IN NEBU
2.5000 mg | INHALATION_SOLUTION | RESPIRATORY_TRACT | Status: DC | PRN
  Administered 2013-06-20: 2.5 mg via RESPIRATORY_TRACT
  Filled 2013-06-20 (×2): qty 0.5

## 2013-06-20 MED ORDER — DOPAMINE-DEXTROSE 3.2-5 MG/ML-% IV SOLN
2.0000 ug/kg/min | Freq: Once | INTRAVENOUS | Status: AC
  Administered 2013-06-20: 15 ug/kg/min via INTRAVENOUS
  Filled 2013-06-20: qty 250

## 2013-06-20 MED ORDER — SODIUM CHLORIDE 0.9 % IV SOLN
250.0000 mL | INTRAVENOUS | Status: DC | PRN
  Administered 2013-06-23: via INTRAVENOUS

## 2013-06-20 MED ORDER — IPRATROPIUM BROMIDE 0.02 % IN SOLN
0.5000 mg | Freq: Four times a day (QID) | RESPIRATORY_TRACT | Status: DC
  Administered 2013-06-20 – 2013-06-22 (×9): 0.5 mg via RESPIRATORY_TRACT
  Filled 2013-06-20 (×8): qty 2.5

## 2013-06-20 MED ORDER — IPRATROPIUM BROMIDE 0.02 % IN SOLN
0.5000 mg | RESPIRATORY_TRACT | Status: DC | PRN
  Administered 2013-06-20: 0.5 mg via RESPIRATORY_TRACT
  Filled 2013-06-20: qty 2.5

## 2013-06-20 MED ORDER — FENTANYL CITRATE 0.05 MG/ML IJ SOLN
INTRAMUSCULAR | Status: AC
  Filled 2013-06-20: qty 2

## 2013-06-20 MED ORDER — HEPARIN SODIUM (PORCINE) 5000 UNIT/ML IJ SOLN
5000.0000 [IU] | Freq: Three times a day (TID) | INTRAMUSCULAR | Status: DC
  Administered 2013-06-20 – 2013-06-30 (×32): 5000 [IU] via SUBCUTANEOUS
  Filled 2013-06-20 (×34): qty 1

## 2013-06-20 MED ORDER — NOREPINEPHRINE BITARTRATE 1 MG/ML IJ SOLN
2.0000 ug/min | INTRAVENOUS | Status: DC
  Administered 2013-06-20: 15 ug/min via INTRAVENOUS
  Filled 2013-06-20: qty 4

## 2013-06-20 MED ORDER — ALBUTEROL (5 MG/ML) CONTINUOUS INHALATION SOLN
10.0000 mg/h | INHALATION_SOLUTION | RESPIRATORY_TRACT | Status: DC
  Administered 2013-06-20: 10 mg/h via RESPIRATORY_TRACT

## 2013-06-20 MED ORDER — DEXMEDETOMIDINE HCL IN NACL 200 MCG/50ML IV SOLN
0.0000 ug/kg/h | INTRAVENOUS | Status: DC
  Administered 2013-06-20: 0.1 ug/kg/h via INTRAVENOUS
  Filled 2013-06-20: qty 50

## 2013-06-20 MED ORDER — DIVALPROEX SODIUM 500 MG PO DR TAB
750.0000 mg | DELAYED_RELEASE_TABLET | Freq: Every day | ORAL | Status: DC
  Administered 2013-06-20: 750 mg via ORAL
  Filled 2013-06-20 (×2): qty 1

## 2013-06-20 MED ORDER — PANTOPRAZOLE SODIUM 40 MG IV SOLR
40.0000 mg | Freq: Every day | INTRAVENOUS | Status: DC
  Administered 2013-06-20 – 2013-06-21 (×2): 40 mg via INTRAVENOUS
  Filled 2013-06-20 (×3): qty 40

## 2013-06-20 MED ORDER — IOHEXOL 350 MG/ML SOLN
100.0000 mL | Freq: Once | INTRAVENOUS | Status: AC | PRN
  Administered 2013-06-20: 100 mL via INTRAVENOUS

## 2013-06-20 MED ORDER — FENTANYL BOLUS VIA INFUSION
50.0000 ug | INTRAVENOUS | Status: DC | PRN
  Filled 2013-06-20: qty 100

## 2013-06-20 MED ORDER — PROPOFOL 10 MG/ML IV EMUL
5.0000 ug/kg/min | Freq: Once | INTRAVENOUS | Status: AC
  Administered 2013-06-20: 5 ug/kg/min via INTRAVENOUS

## 2013-06-20 MED ORDER — MIDAZOLAM HCL 2 MG/2ML IJ SOLN
INTRAMUSCULAR | Status: AC
  Filled 2013-06-20: qty 2

## 2013-06-20 MED ORDER — CLONAZEPAM 0.5 MG PO TABS
1.0000 mg | ORAL_TABLET | Freq: Two times a day (BID) | ORAL | Status: DC
  Administered 2013-06-20 (×2): 1 mg via ORAL
  Filled 2013-06-20 (×2): qty 2

## 2013-06-20 MED ORDER — SODIUM CHLORIDE 0.9 % IV BOLUS (SEPSIS)
500.0000 mL | Freq: Once | INTRAVENOUS | Status: AC
  Administered 2013-06-20: 500 mL via INTRAVENOUS

## 2013-06-20 MED ORDER — SODIUM CHLORIDE 0.9 % IV SOLN
0.0000 mg/h | INTRAVENOUS | Status: DC
  Administered 2013-06-20 (×2): 2 mg/h via INTRAVENOUS
  Administered 2013-06-21 – 2013-06-22 (×4): 4 mg/h via INTRAVENOUS
  Filled 2013-06-20 (×7): qty 10

## 2013-06-20 MED ORDER — FENTANYL CITRATE 0.05 MG/ML IJ SOLN
100.0000 ug | INTRAMUSCULAR | Status: DC | PRN
  Administered 2013-06-20: 100 ug via INTRAVENOUS

## 2013-06-20 MED ORDER — CHLORHEXIDINE GLUCONATE 0.12 % MT SOLN
15.0000 mL | Freq: Two times a day (BID) | OROMUCOSAL | Status: DC
  Administered 2013-06-20 – 2013-06-23 (×8): 15 mL via OROMUCOSAL
  Filled 2013-06-20 (×8): qty 15

## 2013-06-20 MED ORDER — MIDAZOLAM HCL 2 MG/2ML IJ SOLN
2.0000 mg | INTRAMUSCULAR | Status: DC | PRN
  Administered 2013-06-20: 2 mg via INTRAVENOUS

## 2013-06-20 MED ORDER — PROPOFOL 10 MG/ML IV EMUL
INTRAVENOUS | Status: AC
  Filled 2013-06-20: qty 100

## 2013-06-20 MED ORDER — BIOTENE DRY MOUTH MT LIQD
15.0000 mL | Freq: Four times a day (QID) | OROMUCOSAL | Status: DC
  Administered 2013-06-20 – 2013-06-24 (×16): 15 mL via OROMUCOSAL

## 2013-06-20 MED ORDER — SODIUM CHLORIDE 0.9 % IV SOLN
0.0000 ug/h | INTRAVENOUS | Status: DC
  Administered 2013-06-20: 50 ug/h via INTRAVENOUS
  Administered 2013-06-21: 200 ug/h via INTRAVENOUS
  Administered 2013-06-22: 50 ug/h via INTRAVENOUS
  Administered 2013-06-23: 200 ug/h via INTRAVENOUS
  Filled 2013-06-20 (×7): qty 50

## 2013-06-20 MED FILL — Albuterol Sulfate Soln Nebu 0.5% (5 MG/ML): RESPIRATORY_TRACT | Qty: 20 | Status: AC

## 2013-06-20 NOTE — Procedures (Signed)
Arterial Catheter Insertion Procedure Note MARLIA SCHEWE [id number] 1950-10-13  Procedure: Insertion of Arterial Catheter  Indications: Blood pressure monitoring and Frequent blood sampling  Procedure Details Consent: Unable to obtain consent because of altered level of consciousness. Time Out: Verified patient identification, verified procedure, site/side was marked, verified correct patient position, special equipment/implants available, medications/allergies/relevent history reviewed, required imaging and test results available.  Performed  Maximum sterile technique was used including antiseptics, cap, gloves, gown, hand hygiene, mask and sheet. Skin prep: Chlorhexidine; local anesthetic administered 20 gauge catheter was inserted into right radial artery using the Seldinger technique.  Evaluation Blood flow good; BP tracing good. Complications: No apparent complications.   Inez Pilgrim 06/20/2013

## 2013-06-20 NOTE — Progress Notes (Signed)
Echo Lab  2D Echocardiogram completed.  Jana Swartzlander L Jordan Pardini, RDCS 06/20/2013 11:05 AM

## 2013-06-20 NOTE — ED Notes (Signed)
Took muscle relaxers she knew she was not suppose to take.

## 2013-06-20 NOTE — ED Notes (Addendum)
Pt. states she can not breathe. HR- 120-130. RR- 30-40. Cont. On Bipap. Dr. Fayrene Fearing at bedside. Pt. Telling him she doesn't fell like she is getting any air. Decision made to intubate Pt. Pt.'s family at bedside.

## 2013-06-20 NOTE — Progress Notes (Signed)
PULMONARY / CRITICAL CARE MEDICINE  Name: Maria Lucero MRN: [id number] DOB: 04-Apr-1951    ADMISSION DATE:  06/19/2013 CONSULTATION DATE:  06/19/2013  REFERRING MD :  EDP PRIMARY SERVICE:  PCCM  CHIEF COMPLAINT:  Acute respiratory failure  BRIEF PATIENT DESCRIPTION: 62 yo smoker with past medical history of COPD, lung CA and recent shoulder fracture ORIF admitted with hypercarbic respiratory failure and RLL pneumonia.  SIGNIFICANT EVENTS / STUDIES:  12/23  CT angio >>> The previously noted nodule along the right major fissure now measures 0.8 cm, increased in size from the prior study, and a new 0.9 cm nodule is seen more inferiorly along the right major fissure. Dense consolidation and volume loss at the left upper lobe is grossly unchanged in appearance 12/23  TTE >>> Limited study, EF 45%, anterior / anteroseptal WMA, mildly dilated RV with possible hypokinesis, PAP 55 torr  LINES / TUBES: OETT 12/23 >>> OGT 12/23 >>> Foley 12/23  >>> Medi-Port >>> R rad A-line 12/23 >>>  CULTURES: 12/23  MRSA PCR >>> neg 12/23 Blood >>> 12/23 Urine >>> 12/23 Respiratory >>>  ANTIBIOTICS: Cefepime 12/23 >>> Vancomycin 12/23 >>>  INTERVAL HISTORY:  Intermittently agitated.  VITAL SIGNS: Temp:  [98 F (36.7 C)-100 F (37.8 C)] 99.5 F (37.5 C) (12/24 0700) Pulse Rate:  [93-141] 94 (12/24 0700) Resp:  [10-20] 12 (12/24 0700) BP: (42-154)/(30-108) 96/45 mmHg (12/24 0700) SpO2:  [93 %-100 %] 93 % (12/24 0700) Arterial Line BP: (77-158)/(45-93) 96/93 mmHg (12/24 0700) FiO2 (%):  [40 %] 40 % (12/24 0349) Weight:  [71.2 kg (156 lb 15.5 oz)] 71.2 kg (156 lb 15.5 oz) (12/24 0500) HEMODYNAMICS:   VENTILATOR SETTINGS: Vent Mode:  [-] PRVC FiO2 (%):  [40 %] 40 % Set Rate:  [12 bmp] 12 bmp Vt Set:  [460 mL] 460 mL PEEP:  [5 cmH20] 5 cmH20 Pressure Support:  [5 cmH20-10 cmH20] 5 cmH20 Plateau Pressure:  [19 cmH20-22 cmH20] 22 cmH20  INTAKE / OUTPUT: Intake/Output     12/23 0701 -  12/24 0700 12/24 0701 - 12/25 0700   P.O.     I.V. (mL/kg) 1573.6 (22.1)    NG/GT 30    IV Piggyback 650    Total Intake(mL/kg) 2253.6 (31.7)    Urine (mL/kg/hr) 890 (0.5)    Emesis/NG output 550 (0.3)    Total Output 1440     Net +813.6           PHYSICAL EXAMINATION: General:  Appears acutely ill, mechanically ventilated, synchronous Neuro:  Encephalopathic, agitated, nonfocal, cough / gag present HEENT:  PERRL, OETT / OGT Cardiovascular:  RRR, no m/r/g Lungs:  Bilateral diminished air entry (L>R) Abdomen:  Soft, nontender, bowel sounds diminished Musculoskeletal:  Moves all extremities, no edema, sling LUE Skin:  Intact  LABS: CBC  Recent Labs Lab 06/19/13 2319 06/20/13 0500 06/21/13 0518  WBC 13.2* 18.9* 14.2*  HGB 8.9* 8.5* 7.3*  HCT 28.7* 27.1* 23.3*  PLT 494* 431* 296   Coag's No results found for this basename: APTT, INR,  in the last 168 hours BMET  Recent Labs Lab 06/19/13 2319 06/20/13 0500 06/21/13 0518  NA 135 140 140  K 4.0 3.3* 4.2  CL 92* 103 105  CO2 18* 24 26  BUN 8 9 10   CREATININE 0.68 0.45* 0.57  GLUCOSE 302* 219* 112*   Electrolytes  Recent Labs Lab 06/19/13 2319 06/20/13 0500 06/21/13 0518  CALCIUM 8.7 7.5* 8.2*  MG  --  1.7 1.8  PHOS  --  3.7 3.3   Sepsis Markers  Recent Labs Lab 06/19/13 2331  LATICACIDVEN 8.79*   ABG  Recent Labs Lab 06/19/13 2345 06/20/13 0352  PHART 7.342* 7.294*  PCO2ART 45.3* 48.8*  PO2ART 232.0* 31.0*   Liver Enzymes No results found for this basename: AST, ALT, ALKPHOS, BILITOT, ALBUMIN,  in the last 168 hours Cardiac Enzymes  Recent Labs Lab 06/20/13 0830 06/20/13 1246  TROPONINI 0.68* 0.47*   Glucose  Recent Labs Lab 06/20/13 0744 06/20/13 1242 06/20/13 1609 06/20/13 1957 06/20/13 2324 06/21/13 0317  GLUCAP 213* 129* 136* 109* 118* 121*   CXR:  12/24 >>> Hardware in good position, opacification L chest with volume loss  ASSESSMENT / PLAN:  PULMONARY A:  Acute  respiratory failure.  Suspected aspiration pneumonia. History of treated lung CA, now recurrent / enlarging RLL nodule. Opacification chest with volume loss, likely mucus plug / atelectasis. Pulmonary hypertension. P:   Therapeutic bronchoscopy today Goal SpO2>92, pH>7.30 Full mechanical support, consider weaning after bronchoscopy Daily SBT Ventilator bundle Trend ABG / CXR Albuterol / Atrovent Solu-medrol 60 q6h  CARDIOVASCULAR A: Septic shock. NSTEMI. Prolonged QT. Acute ( on chronic?) systolic congestive heart failure. P:  Goal MAP>60 Recheck troponin / lactate Follow TTE results Levophed gtt ASA intolerance BB/ACEI unable due to hypotension Start Lipitor  RENAL A:  No active issues. P:   Trend BMP D/c IVF  GASTROINTESTINAL A:  No active issues. P:   NPO as intubated Protonix for GI Px Start TF  HEMATOLOGIC A:  Anemia ( acute? chronic?) P:  Trend CBC Heparin for DVT Px  INFECTIOUS A:  Suspected aspiration pneumonia. P:   Cultures and antibiotics as above Preadmission Plaquenil  ENDOCRINE  A:  Hyperglycemia - stress, steroids.  P:   SSI  NEUROLOGIC A:  Acute encephalopathy. P:   Goal RASS 0 to -1 Daily WUA Fentanyl / Versed gtt Avoid Seroquel ( prolonged QT ) Hold Wellbutrin, Cymbalta, Klonopin, Depakote   I have personally obtained history, examined patient, evaluated and interpreted laboratory and imaging results, reviewed medical records, formulated assessment / plan and placed orders.  CRITICAL CARE:  The patient is critically ill with multiple organ systems failure and requires high complexity decision making for assessment and support, frequent evaluation and titration of therapies, application of advanced monitoring technologies and extensive interpretation of multiple databases. Critical Care Time devoted to patient care services described in this note is 35 minutes.   Lonia Farber, MD Pulmonary and Critical Care  Medicine [facility] Pager: [phone]  06/21/2013, 7:41 AM

## 2013-06-20 NOTE — ED Notes (Addendum)
Pt. Intubated with #7.5 ETT. 19cm at the lip. + color change noted of Co2 detector and breath sounds heard all fields by Dr. Fayrene Fearing. Radiology here to do CXR.

## 2013-06-20 NOTE — ED Notes (Signed)
Critical troponin reported to Dr.James 

## 2013-06-20 NOTE — ED Notes (Signed)
Propofol started at 16mcq/kg/min.

## 2013-06-20 NOTE — ED Provider Notes (Signed)
Procedure note: Ultrasound Guided Peripheral IV Ultrasound guided 20 g peripheral 1.88 inch angiocath IV placement performed by me. Indications: Nursing unable to place IV, need for CT IV contrast. Details: The RIGHT antecubital fossa and upper arm was evaluated with a multifrequency linear probe. Several patent brachial veins are noted. 2 attempts were made to cannulate a RIGHT vein under realtime US guidance with successful cannulation of the vein and catheter placement. There is return of non-pulsatile dark red blood. The patient tolerated the procedure well without complications. An ultrasound image not archived.  Imagene Sheller, MD 06/20/13 [phone]

## 2013-06-20 NOTE — Progress Notes (Signed)
Met pt's husband who was bedside. Pt's husband admitted to having some anger because he did not understand why pt was going through so much. He expressed feelings of helplessness. I listened and offered comfort and compassion. Pt's husband appreciated visit. He said he knows all is in God's hands but was not interested in prayer because of his feelings of anger. Pt's husband would appreciate additional support as his wife was intubated. Marjory Lies Chaplain  06/20/13 [phone]  Clinical Encounter Type  Visited With Family

## 2013-06-20 NOTE — Progress Notes (Signed)
Tracheal Aspirate Sent to lab.

## 2013-06-20 NOTE — Progress Notes (Signed)
INITIAL NUTRITION ASSESSMENT  DOCUMENTATION CODES Per approved criteria  -Not Applicable   INTERVENTION: If enteral nutrition desired, recommend initiation of Vital High Protein at 25 ml/hr, advance by 10 ml q 4 hours, to goal of 55 ml/hr. This will provide: 1320 kcal, 116 grams protein, 1034 ml free water. RD to continue to follow nutrition care plan.  NUTRITION DIAGNOSIS: Inadequate oral intake related to inability to eat as evidenced by NPO status.   Goal: Intake to meet >90% of estimated nutrition needs.  Monitor:  weight trends, lab trends, I/O's, vent status/settings, enteral nutrition  Reason for Assessment: Malnutrition Screening Tool  62 y.o. female  Admitting Dx: RLL PNA  ASSESSMENT: PMHx significant for ORIF L shoulder, HA, anxiety, depression, bipolar 1 disorder, COPD, GERD, lung CA. Admitted with RLL PNA, per MD, pt also with probable recurrent lung CA.  Per MD note, plan to start TFs in 24 hours.  Patient is currently intubated on ventilator support.  MV: 5.8 L/min Temp (24hrs), Avg:98.2 F (36.8 C), Min:97.1 F (36.2 C), Max:99.3 F (37.4 C)  Propofol: none  Brief physical exam reveals no significant fat and muscle mass loss. No family present at bedside. Potassium low at 3.3. Magnesium and phosphorus are WNL.  Height: Ht Readings from Last 1 Encounters:  06/19/13 5\' 5"  (1.651 m)    Weight: Wt Readings from Last 1 Encounters:  06/20/13 156 lb 15.5 oz (71.2 kg)    Ideal Body Weight: 125 lb  % Ideal Body Weight: 125%  Wt Readings from Last 10 Encounters:  06/20/13 156 lb 15.5 oz (71.2 kg)  06/13/13 148 lb (67.132 kg)  06/13/13 148 lb (67.132 kg)  06/13/13 148 lb (67.132 kg)  06/15/13 148 lb (67.132 kg)  06/11/13 148 lb (67.132 kg)  01/11/13 150 lb 14.4 oz (68.448 kg)  12/28/12 146 lb 12.8 oz (66.588 kg)  11/29/12 145 lb 6.4 oz (65.953 kg)  06/27/12 151 lb (68.493 kg)    Usual Body Weight: 145 - 150 lb  % Usual Body Weight:  105%  BMI:  Body mass index is 26.12 kg/(m^2). Overweight  Estimated Nutritional Needs: Kcal: 1430 Protein: 115 - 125 grams Fluid: 1.8 - 2 liters  Skin: L shoulder incision  Diet Order: NPO  EDUCATION NEEDS: -No education needs identified at this time   Intake/Output Summary (Last 24 hours) at 06/20/13 1509 Last data filed at 06/20/13 0804  Gross per 24 hour  Intake 3811.58 ml  Output    550 ml  Net 3261.58 ml    Last BM: PTA  Labs:   Recent Labs Lab 06/17/13 0455 06/19/13 2319 06/20/13 0500  NA 138 135 140  K 3.8 4.0 3.3*  CL 98 92* 103  CO2 31 18* 24  BUN 5* 8 9  CREATININE 0.56 0.68 0.45*  CALCIUM 8.8 8.7 7.5*  MG  --   --  1.7  PHOS  --   --  3.7  GLUCOSE 112* 302* 219*    CBG (last 3)   Recent Labs  06/20/13 0522 06/20/13 0744 06/20/13 1242  GLUCAP 163* 213* 129*    Scheduled Meds: . albuterol  2.5 mg Nebulization Q6H   And  . ipratropium  0.5 mg Nebulization Q6H  . antiseptic oral rinse  15 mL Mouth Rinse QID  . buPROPion  100 mg Oral BID  . ceFEPime (MAXIPIME) IV  1 g Intravenous Q8H  . chlorhexidine  15 mL Mouth Rinse BID  . clonazePAM  1 mg Oral BID  .  divalproex  750 mg Oral QHS  . DULoxetine  60 mg Oral QAC breakfast  . fentaNYL  50 mcg Intravenous Once  . heparin  5,000 Units Subcutaneous Q8H  . hydroxychloroquine  400 mg Oral Daily  . insulin aspart  0-15 Units Subcutaneous Q4H  . methylPREDNISolone (SOLU-MEDROL) injection  60 mg Intravenous Q6H  . pantoprazole (PROTONIX) IV  40 mg Intravenous QHS  . vancomycin  750 mg Intravenous Q12H    Continuous Infusions: . sodium chloride 50 mL/hr at 06/20/13 0416  . albuterol 10 mg/hr (06/20/13 0206)  . fentaNYL infusion INTRAVENOUS 50 mcg/hr (06/20/13 1245)  . midazolam (VERSED) infusion 1 mg/hr (06/20/13 1245)  . norepinephrine (LEVOPHED) Adult infusion Stopped (06/20/13 2130)    Past Medical History  Diagnosis Date  . Migraine headache   . Anxiety   . Urticaria   . Low  back pain syndrome   . Hyperlipidemia   . Hypercholesterolemia   . Depression   . Tobacco abuse   . Myalgia and myositis, unspecified   . Bipolar 1 disorder   . Allergy   . Asthma     per pt stated  . Blood transfusion     several x  . Cataract     recent surgery b/l  . Arthritis   . History of radiation therapy 04/11/12; 04/13/12; 04/15/12; 04/18/12; & 04/20/12    Rll lung 60Gy/5/fx  . GERD (gastroesophageal reflux disease)   . COPD (chronic obstructive pulmonary disease)     home o2 at 2l/min  . Shortness of breath     home o2 at 2l/min  . lung ca dx'd 11/2009    chemo comp 05/2010,home o2 at 2l/min  . Lung cancer     invasive left upper lobe adenocarcinoma  . Complication of anesthesia     low oxygen  . PONV (postoperative nausea and vomiting)   . Fracture of humerus, proximal, left, closed 06/16/2013    Past Surgical History  Procedure Laterality Date  . Back surgery      x 4 L3-S1 1996,2000, 2002  . Vesicovaginal fistula closure w/ tah    . Appendectomy    . Bronchoscopy  2011  . Lung biopsy  2011    needle  . Tonsillectomy    . Abdominal hysterectomy      with b/l oophoorectomy age 85  . Lumbar fusion      Jarold Motto MS, RD, LDN Pager: [phone] After-hours pager: [phone]

## 2013-06-20 NOTE — ED Notes (Signed)
#  14Fr foley placed per NT. Clear yellow urine returned.

## 2013-06-20 NOTE — H&P (Signed)
Name: Maria Lucero MRN: [id number] DOB: [birth year]    ADMISSION DATE:  06/19/2013  PRIMARY SERVICE: PCCM PCP- Crissie Sickles harris  CHIEF COMPLAINT:  shortness of breath  BRIEF PATIENT DESCRIPTION: 62 y.o ex smoker s/p ORIF lt shoulder on 12/19 [name]) BIBEMS for sudden onset sob, failed bipap & intubated in ED for hypercarbic resp failure & RLL pneumonia.  PMH - bipolar, severe COPD with h/o Unresectable stage IIB lung cancer s/p chemo-RT in 2011 & Curative radiotherapy to a right lower lobe nodule, neg PET  12/2012    SIGNIFICANT EVENTS / STUDIES:  12/23 CT angio >>The previously noted nodule along the right major fissure now measures 0.8 cm, increased in size from the prior study, and a new 0.9 cm nodule is seen more inferiorly along the right major fissure. Dense consolidation and volume loss at the left upper lobe  is grossly unchanged in appearance   LINES / TUBES:  ETT 12/23 >>  CULTURES: bld 12/23 >> resp 12/23 >> Urine 12/23 >>  ANTIBIOTICS: 12/23 vanc >> 12/23 cefepime >>  HISTORY OF PRESENT ILLNESS:  62 y.o ex smoker s/p ORIF lt shoulder on 12/19 [name]) BIBEMS for sudden onset sob, failed bipap & intubated in ED for hypercarbic resp failure & RLL pneumonia.  PMH - bipolar, severe COPD on 2L [place] ([place]) with h/o Unresectable stage IIB lung cancer s/p chemo-RT in 2011 & Curative radiotherapy to a right lower lobe nodule, neg PET  12/2012  Prior admissions for resp failure attributed to narcotics & muscle relaxants Difficult to control bipolar per family- will not go to psych Husband sick with bronchitis but pt did not have URI symptoms   PAST MEDICAL HISTORY :  Past Medical History  Diagnosis Date  . Migraine headache   . Anxiety   . Urticaria   . Low back pain syndrome   . Hyperlipidemia   . Hypercholesterolemia   . Depression   . Tobacco abuse   . Myalgia and myositis, unspecified   . Bipolar 1 disorder   . Allergy   . Asthma     per pt stated  . Blood  transfusion     several x  . Cataract     recent surgery b/l  . Arthritis   . History of radiation therapy 04/11/12; 04/13/12; 04/15/12; 04/18/12; & 04/20/12    Rll lung 60Gy/5/fx  . GERD (gastroesophageal reflux disease)   . COPD (chronic obstructive pulmonary disease)     home o2 at 2l/min  . Shortness of breath     home o2 at 2l/min  . lung ca dx'd 11/2009    chemo comp 05/2010,home o2 at 2l/min  . Lung cancer     invasive left upper lobe adenocarcinoma  . Complication of anesthesia     low oxygen  . PONV (postoperative nausea and vomiting)   . Fracture of humerus, proximal, left, closed 06/16/2013   Past Surgical History  Procedure Laterality Date  . Back surgery      x 4 L3-S1 1996,2000, 2002  . Vesicovaginal fistula closure w/ tah    . Appendectomy    . Bronchoscopy  2011  . Lung biopsy  2011    needle  . Tonsillectomy    . Abdominal hysterectomy      with b/l oophoorectomy age 86  . Lumbar fusion     Prior to Admission medications   Medication Sig Start Date End Date Taking? Authorizing Provider  albuterol (PROVENTIL) (2.5 MG/3ML) 0.083% nebulizer solution  Take 2.5 mg by nebulization every 6 (six) hours as needed for wheezing.    Historical Provider, MD  buPROPion (WELLBUTRIN) 100 MG tablet Take 100 mg by mouth 2 (two) times daily.    Historical Provider, MD  clonazePAM (KLONOPIN) 1 MG tablet Take 1-2 mg by mouth 3 (three) times daily.     Historical Provider, MD  divalproex (DEPAKOTE) 250 MG DR tablet Take 750 mg by mouth at bedtime.    Historical Provider, MD  DULoxetine (CYMBALTA) 60 MG capsule Take 60 mg by mouth daily before breakfast.     Historical Provider, MD  folic acid (FOLVITE) 1 MG tablet Take 1 mg by mouth daily.    Historical Provider, MD  hydroxychloroquine (PLAQUENIL) 200 MG tablet Take 400 mg by mouth daily.     Historical Provider, MD  omeprazole (PRILOSEC) 20 MG capsule Take 20 mg by mouth daily.      Historical Provider, MD    oxyCODONE-acetaminophen (PERCOCET) 10-325 MG per tablet Take 1-2 tablets by mouth every 6 (six) hours as needed. Pain 06/16/13   Eulas Post, MD  promethazine (PHENERGAN) 25 MG tablet Take 1 tablet (25 mg total) by mouth every 6 (six) hours as needed for nausea or vomiting. 06/16/13   Eulas Post, MD  QUEtiapine (SEROQUEL) 25 MG tablet Take 25 mg by mouth at bedtime.      Historical Provider, MD  simvastatin (ZOCOR) 40 MG tablet Take 40 mg by mouth daily.  08/27/11   Historical Provider, MD  SUMAtriptan (IMITREX) 100 MG tablet Take 100 mg by mouth every 2 (two) hours as needed. Headache     Historical Provider, MD  traMADol (ULTRAM) 50 MG tablet Take 50 mg by mouth every 6 (six) hours as needed for pain.    [name], MD  triamcinolone cream (KENALOG) 0.1 % Apply 1 application topically 2 (two) times daily as needed (for itching).  10/18/12   Historical Provider, MD   Allergies  Allergen Reactions  . Methocarbamol Nausea And Vomiting  . Morphine Sulfate Er Beads Other (See Comments)    confusion  . Aspirin Nausea And Vomiting  . Clarithromycin Other (See Comments)    Stomach problems   . Codeine Nausea And Vomiting  . Lyrica [Pregabalin] Other (See Comments)    Weight gain   . Morphine Other (See Comments)    Passed out  . Levaquin [Levofloxacin] Rash    FAMILY HISTORY:  Family History  Problem Relation Age of Onset  . Heart disease      paternal side  . Rheum arthritis Mother   . Lung cancer    . Colon cancer    . Brain cancer    . Kidney cancer Maternal Grandmother    SOCIAL HISTORY:  reports that she quit smoking about 4 years ago. Her smoking use included Cigarettes. She has a 51 pack-year smoking history. She has never used smokeless tobacco. She reports that she does not drink alcohol or use illicit drugs.  REVIEW OF SYSTEMS:  Unable to obtain  SUBJECTIVE:   VITAL SIGNS: Temp:  [97.3 F (36.3 C)] 97.3 F (36.3 C) (12/22 2339) Pulse Rate:  [101-136]  113 (12/23 0425) Resp:  [12-37] 12 (12/23 0425) BP: (56-142)/(15-97) 111/67 mmHg (12/23 0425) SpO2:  [93 %-100 %] 97 % (12/23 0400) FiO2 (%):  [40 %] 40 % (12/23 0425) Weight:  [67.132 kg (148 lb)] 67.132 kg (148 lb) (12/22 2309) HEMODYNAMICS:   VENTILATOR SETTINGS: Vent Mode:  [-] PRVC FiO2 (%):  [  40 %] 40 % Set Rate:  [12 bmp] 12 bmp Vt Set:  [460 mL] 460 mL PEEP:  [5 cmH20] 5 cmH20 Plateau Pressure:  [29 cmH20-32 cmH20] 29 cmH20 INTAKE / OUTPUT: Intake/Output     12/22 0701 - 12/23 0700   P.O. 3500   Total Intake(mL/kg) 3500 (52.1)   Net +3500         PHYSICAL EXAMINATION: Gen. Chronically ill, well-nourished, in no distress, intubated orally, sedated on propofol ENT - no lesions, no post nasal drip Neck: No JVD, no thyromegaly, no carotid bruits Lungs: no use of accessory muscles, no dullness to percussion, clear without rales, faint exp rhonchi  Cardiovascular: Rhythm regular, heart sounds  normal, no murmurs, no peripheral edema Abdomen: soft and non-tender, no hepatosplenomegaly, BS normal. Musculoskeletal: No deformities, no cyanosis or clubbing Neuro:  Seadted, responds to1 step commands Skin:  Warm, no lesions/ rash   LABS:  CBC  Recent Labs Lab 06/16/13 2200 06/17/13 0455 06/19/13 2319  WBC 7.6 5.9 13.2*  HGB 8.5* 8.2* 8.9*  HCT 26.2* 24.8* 28.7*  PLT 309 310 494*   Coag's No results found for this basename: APTT, INR,  in the last 168 hours BMET  Recent Labs Lab 06/15/13 1430 06/16/13 2200 06/17/13 0455 06/19/13 2319  NA 131*  --  138 135  K 3.8  --  3.8 4.0  CL 95*  --  98 92*  CO2 23  --  31 18*  BUN 10  --  5* 8  CREATININE 0.66 0.53 0.56 0.68  GLUCOSE 63*  --  112* 302*   Electrolytes  Recent Labs Lab 06/15/13 1430 06/17/13 0455 06/19/13 2319  CALCIUM 9.2 8.8 8.7   Sepsis Markers  Recent Labs Lab 06/19/13 2331  LATICACIDVEN 8.79*   ABG  Recent Labs Lab 06/19/13 2345 06/20/13 0352  PHART 7.342* 7.294*  PCO2ART  45.3* 48.8*  PO2ART 232.0* 31.0*   Liver Enzymes No results found for this basename: AST, ALT, ALKPHOS, BILITOT, ALBUMIN,  in the last 168 hours Cardiac Enzymes No results found for this basename: TROPONINI, PROBNP,  in the last 168 hours Glucose  Recent Labs Lab 06/17/13 0705  GLUCAP 97    Imaging Ct Angio Chest Pe W/cm &/or Wo Cm  06/20/2013   CLINICAL DATA:  Hypoxemia; hypotensive.  EXAM: CT ANGIOGRAPHY CHEST WITH CONTRAST  TECHNIQUE: Multidetector CT imaging of the chest was performed using the standard protocol during bolus administration of intravenous contrast. Multiplanar CT image reconstructions including MIPs were obtained to evaluate the vascular anatomy.  CONTRAST:  OMNIPAQUE IOHEXOL 350 MG/ML SOLN  COMPARISON:  CT of the chest performed 12/26/2012, and chest radiograph performed 06/19/2013  FINDINGS: There is no evidence of pulmonary embolus. Evaluation for pulmonary embolus is suboptimal in areas of airspace opacification.  A prominent spiculated hazy opacity at the right lower lobe is slightly less well defined than on the prior study, with somewhat increased architectural distortion. As before, this raises concern for bronchogenic carcinoma. There is now a trace right pleural effusion. The previously noted nodule along the right major fissure now measures 0.8 cm, increased in size from the prior study, and a new 0.9 cm nodule is seen more inferiorly along the right major fissure. Dense consolidation and volume loss at the left upper lobe is grossly unchanged in appearance. Underlying emphysematous change is again noted bilaterally. There is no evidence of pneumothorax.  A small pericardial effusion is noted, similar in appearance to the prior study. The mediastinum  is otherwise unremarkable in appearance. No mediastinal lymphadenopathy is seen. The great vessels are grossly unremarkable in appearance. No axillary lymphadenopathy is seen. The thyroid gland is unremarkable in  appearance.  The visualized portions of the liver and spleen are unremarkable.  No acute osseous abnormalities are seen. Postoperative change is noted along the proximal left humerus. A right-sided chest port is noted.  Review of the MIP images confirms the above findings.  IMPRESSION: 1. No evidence for pulmonary embolus. 2. Prominent spiculated hazy opacity at the right lower lung lobe is slightly less well defined, with somewhat increased architectural distortion. As before, this is suspicious for bronchogenic carcinoma. There is now a trace right pleural effusion. Nodules along the right major fissure have also increased in size, or are new from the prior study, raising concern for satellite nodules. 3. Stable chronic dense consolidation involving most of the left upper lung lobe. 4. Bilateral emphysematous change again seen. 5. Stable small pericardial effusion noted.   Electronically Signed   By: Roanna Raider M.D.   On: 06/20/2013 01:35   Dg Chest Port 1 View  06/20/2013   CLINICAL DATA:  Evaluate endotracheal tube  EXAM: PORTABLE CHEST - 1 VIEW  COMPARISON:  Prior CT from the same day as well as earlier chest radiographs.  FINDINGS: Axis right Port-A-Cath is stable in position. Tip of the endotracheal tube is positioned 5.0 cm above the carina. Cardiac silhouette is unchanged.  New dense opacity within the left upper lobe with associated architectural distortion and volume loss is Stable the prior. Hazy right lower lobe opacities again noted, unchanged. Emphysematous changes noted. The left lower lobe appears largely collapsed. No pneumothorax.  Fixation hardware seen within the thoracolumbar spine, partially visualized. Sequelae of prior ORIF again noted within the proximal left humerus. No acute osseous abnormality.  IMPRESSION: 1. Tip of the endotracheal tube 5.0 cm above the carina. 2. Otherwise stable appearance of the chest with chronic changes of the left hemi thorax and diffuse hazy opacity  within the right lung base.   Electronically Signed   By: Rise Mu M.D.   On: 06/20/2013 02:46   Dg Chest Port 1 View  06/19/2013   CLINICAL DATA:  Short of breath.  EXAM: PORTABLE CHEST - 1 VIEW  COMPARISON:  06/15/2013.  FINDINGS: Chronic changes of the left hemithorax are present. There is new airspace disease at the right lung base which may represent aspiration, asymmetric pulmonary edema or pneumonia. Cardiopericardial silhouette appears similar. Power port from right IJ approach again noted, unchanged.  IMPRESSION: New right basilar airspace disease favored to represent aspiration or pneumonia. Asymmetric/atypical pulmonary edema is in the differential considerations.   Electronically Signed   By: Andreas Newport M.D.   On: 06/19/2013 23:40     CXR: RLL consolidation, lt unchanged prior scarring  ASSESSMENT / PLAN:  PULMONARY A:Acute hypercarbic resp failure Favor aspiration pna, rather than ILI -given recent surgery & med list with narcotics, benzos etc, also no prodrome H/o treated lun cancer as above - concern for RLL recurrent nodule P:   PRVC, keep RR low to avoid autoPEEP, permit some resp acidosis Albuterol/atrovent nebs Low dose solumedrol Need realistic discussion about goals once acute issues subsided given overall prognosis  CARDIOVASCULAR A: Septic shock Pos trop Prolonged QT P:  Serial enzymes Modified septic shock protocol - use portocath Volume resuscitate clinically Avoid drugs like seroquel -follow EKG  RENAL A:  No issues, at risk for AKi - contrast , shock P:  Hydrate   GASTROINTESTINAL A:  No issues P:   Start TFs in 24h   HEMATOLOGIC A:  Anemia -recent surgery, chronic disease P:  Transfuse for Hb < 7 only  INFECTIOUS A:  Aspiration pna P:   Cefepime/ vanc Check flu swab, urine strep ag  ENDOCRINE A:  Hyperglycemia - stress, steroids   P:   SSI  NEUROLOGIC A:  Bipolar, poorly controlled P:   Versed/ fent gtt while  in shock Resume bipolar meds -avoid seroquel (long qT0  TODAY'S SUMMARY: Septic shock frmo RLL pna (favor aspiration) 2 ds after surgery , possibly related to meds  - underlying COPD & probably recurrent lung cancer. Upadted family at bedside - given poor baseline, will not insert CVL , they will discuss care limitations & get back to [place] Give volume clinically or use portocath for CVp  I have personally obtained a history, examined the patient, evaluated laboratory and imaging results, formulated the assessment and plan and placed orders. CRITICAL CARE: The patient is critically ill with multiple organ systems failure and requires high complexity decision making for assessment and support, frequent evaluation and titration of therapies, application of advanced monitoring technologies and extensive interpretation of multiple databases. Critical Care Time devoted to patient care services described in this note is 60  minutes.   Oretha Milch  Pulmonary and Critical Care Medicine [facility] Pager: [phone]  06/20/2013, 4:29 AM

## 2013-06-21 ENCOUNTER — Inpatient Hospital Stay (HOSPITAL_COMMUNITY): Payer: Medicare HMO

## 2013-06-21 LAB — CBC WITH DIFFERENTIAL/PLATELET
Basophils Absolute: 0 10*3/uL (ref 0.0–0.1)
Basophils Relative: 0 % (ref 0–1)
HCT: 23.3 % — ABNORMAL LOW (ref 36.0–46.0)
Lymphocytes Relative: 5 % — ABNORMAL LOW (ref 12–46)
MCH: 30.5 pg (ref 26.0–34.0)
MCHC: 31.3 g/dL (ref 30.0–36.0)
MCV: 97.5 fL (ref 78.0–100.0)
Monocytes Absolute: 0.9 10*3/uL (ref 0.1–1.0)
Neutro Abs: 12.7 10*3/uL — ABNORMAL HIGH (ref 1.7–7.7)
Neutrophils Relative %: 89 % — ABNORMAL HIGH (ref 43–77)
Platelets: 296 10*3/uL (ref 150–400)
RBC: 2.39 MIL/uL — ABNORMAL LOW (ref 3.87–5.11)
RDW: 15.2 % (ref 11.5–15.5)
WBC: 14.2 10*3/uL — ABNORMAL HIGH (ref 4.0–10.5)

## 2013-06-21 LAB — GLUCOSE, CAPILLARY
Glucose-Capillary: 118 mg/dL — ABNORMAL HIGH (ref 70–99)
Glucose-Capillary: 121 mg/dL — ABNORMAL HIGH (ref 70–99)
Glucose-Capillary: 121 mg/dL — ABNORMAL HIGH (ref 70–99)
Glucose-Capillary: 119 mg/dL — ABNORMAL HIGH (ref 70–99)

## 2013-06-21 LAB — BLOOD GAS, ARTERIAL
Bicarbonate: 27.2 mEq/L — ABNORMAL HIGH (ref 20.0–24.0)
FIO2: 100 %
O2 Saturation: 100 %
PEEP: 10 cmH2O
TCO2: 28.8 mmol/L (ref 0–100)
pH, Arterial: 7.335 — ABNORMAL LOW (ref 7.350–7.450)
pO2, Arterial: 271 mmHg — ABNORMAL HIGH (ref 80.0–100.0)

## 2013-06-21 LAB — POCT I-STAT 3, ART BLOOD GAS (G3+)
Acid-Base Excess: 4 mmol/L — ABNORMAL HIGH (ref 0.0–2.0)
Bicarbonate: 30.2 mEq/L — ABNORMAL HIGH (ref 20.0–24.0)
O2 Saturation: 92 %
Patient temperature: 99.8
TCO2: 32 mmol/L (ref 0–100)
pO2, Arterial: 70 mmHg — ABNORMAL LOW (ref 80.0–100.0)

## 2013-06-21 LAB — BASIC METABOLIC PANEL
BUN: 10 mg/dL (ref 6–23)
Chloride: 105 mEq/L (ref 96–112)
GFR calc Af Amer: >90 mL/min (ref >90)
GFR calc non Af Amer: >90 mL/min (ref >90)
Glucose, Bld: 112 mg/dL — ABNORMAL HIGH (ref 70–99)
Potassium: 4.2 mEq/L (ref 3.5–5.1)

## 2013-06-21 LAB — TROPONIN I: Troponin I: <0.3 ng/mL (ref <0.30)

## 2013-06-21 LAB — MAGNESIUM: Magnesium: 1.8 mg/dL (ref 1.5–2.5)

## 2013-06-21 LAB — PHOSPHORUS: Phosphorus: 3.3 mg/dL (ref 2.3–4.6)

## 2013-06-21 MED ORDER — VITAL HIGH PROTEIN PO LIQD
1000.0000 mL | ORAL | Status: DC
  Administered 2013-06-21 – 2013-06-23 (×3): 1000 mL
  Filled 2013-06-21 (×6): qty 1000

## 2013-06-21 MED ORDER — ATORVASTATIN CALCIUM 40 MG PO TABS
40.0000 mg | ORAL_TABLET | Freq: Every day | ORAL | Status: DC
  Administered 2013-06-21 – 2013-06-30 (×9): 40 mg via ORAL
  Filled 2013-06-21 (×10): qty 1

## 2013-06-21 MED ORDER — ETOMIDATE 2 MG/ML IV SOLN
10.0000 mg | Freq: Once | INTRAVENOUS | Status: AC
  Administered 2013-06-21: 10 mg via INTRAVENOUS

## 2013-06-21 MED ORDER — ETOMIDATE 2 MG/ML IV SOLN
INTRAVENOUS | Status: AC
  Administered 2013-06-21: 10 mg
  Filled 2013-06-21: qty 10

## 2013-06-21 NOTE — Progress Notes (Signed)
Video bronchoscopy procedure performed. Bronchial washings intervention performed 

## 2013-06-21 NOTE — Plan of Care (Signed)
Problem: Phase I Progression Outcomes Goal: Voiding-avoid urinary catheter unless indicated Outcome: Not Met (add Reason) Pt has foley     

## 2013-06-21 NOTE — Anesthesia Postprocedure Evaluation (Signed)
  Anesthesia Post-op Note  Patient: Maria Lucero  Procedure(s) Performed: Procedure(s): OPEN REDUCTION INTERNAL FIXATION (ORIF) LEFT SHOULDER (Left)  Patient Location: PACU  Anesthesia Type:GA combined with regional for post-op pain  Level of Consciousness: awake, alert  and oriented  Airway and Oxygen Therapy: Patient Spontanous Breathing and Patient connected to nasal cannula oxygen  Post-op Pain: mild  Post-op Assessment: Post-op Vital signs reviewed  Post-op Vital Signs: Reviewed  Complications: No apparent anesthesia complications

## 2013-06-21 NOTE — Procedures (Signed)
Bedside Bronchoscopy Procedure Note Maria Lucero [id number] January 20, 1951  Procedure: Bronchoscopy Indications: Diagnostic evaluation of the airways, Obtain specimens for culture and/or other diagnostic studies and Remove secretions  Procedure Details: ET Tube Size:7.5 ET Tube secured at lip (cm):21 Bite block in place: No In preparation for procedure, Patient hyper-oxygenated with 100 % FiO2 Airway entered and the following bronchi were examined: RUL, RML, RLL, LUL, LLL and Bronchi.   Bronchoscope removed.  , Patient placed back on 100% FiO2 at conclusion of procedure.    Evaluation BP 155/96  Pulse 114  Temp(Src) 99.1 F (37.3 C) (Core (Comment))  Resp 22  Ht 5\' 5"  (1.651 m)  Wt 156 lb 15.5 oz (71.2 kg)  BMI 26.12 kg/m2  SpO2 100% Breath Sounds:Diminished O2 sats: stable throughout Patient's Current Condition: stable Specimens:  Bronchial washings Complications: No apparent complications Patient did tolerate procedure well.   Leonard Downing 06/21/2013, 10:35 AM

## 2013-06-21 NOTE — ED Provider Notes (Signed)
Medical screening examination/treatment/procedure(s) were performed by non-physician practitioner and as supervising physician I was immediately available for consultation/collaboration.  EKG Interpretation    Date/Time:  Monday June 19 2013 23:04:21 EST Ventricular Rate:  116 PR Interval:  170 QRS Duration: 89 QT Interval:  341 QTC Calculation: 474 R Axis:   94 Text Interpretation:  Sinus tachycardia Right axis deviation Borderline T wave abnormalities Confirmed by Fayrene Fearing  MD, Madline Oesterling ([id number]) on 06/19/2013 11:44:35 PM              Rolland Porter, MD 06/21/13 [phone]

## 2013-06-21 NOTE — Procedures (Signed)
Name:  [name] MRN:  [id number] DOB:  09-Feb-1951  PROCEDURE NOTE  Procedure:   Flexible bronchoscopy (29562) Bronchoalveolar lavage [phone]  Indications:  Left lung collapse with volume loss, suspected mucus plug  Consent:  Procedure, benefits, risks and alternatives discussed with husband ( medical POA ).  Questions answered.  Consent obtained.  Anesthesia:  Already sedated for mechanical ventilation. Additionally Etomidate 10 mg IV x 1.  Procedure summary:  Appropriate equipment was assembled.  The patient was identified as Maria Lucero.  Safety timeout was performed. The patient was placed supine and adequate level of sedation was assured.  Flexible bronchoscope was lubricated and inserted via the endotracheal tube.  Airway examination was performed bilaterally to subsegmental level.  Thick secretions were noted and aspirated. No overt endobronchial lesions were identified.  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  Bronchial alveolar lavage of the RML was performed with 50 mL of normal saline and return of 20 mL of fluid, after which the bronchoscope was withdrawn.  Specimens sent:  Bronchial alveolar lavage specimen of the RML for routine cultures and cytology assessment.  Complications:  No immediate complications were noted.  Hemodynamic parameters and oxygenation remained stable throughout the procedure.  Estimated blood loss:  Less then 5 mL.  Orlean Bradford, M.D. Pulmonary and Critical Care Medicine [facility] Pager: [phone]  06/21/2013, 10:05 AM

## 2013-06-21 NOTE — Progress Notes (Signed)
NUTRITION FOLLOW UP  Intervention:    Initiate TF via OGT with Vital High Protein at 20 ml/h, increase by 10 ml every 4 hours to goal rate of 60 ml/h to provide 1440 kcals, 126 gm protein, 1204 ml free water daily.  Nutrition Dx:   Inadequate oral intake related to inability to eat as evidenced by NPO status. Ongoing.  Goal:   Intake to meet >90% of estimated nutrition needs. Progressing.  Monitor:   TF tolerance/adequacy, weight trend, labs, vent status.  Assessment:   PMHx significant for ORIF L shoulder, HA, anxiety, depression, bipolar 1 disorder, COPD, GERD, lung CA. Admitted with RLL PNA, per MD, pt also with probable recurrent lung CA.  Received MD Consult for TF initiation and management. OGT is in place.   Patient is currently intubated on ventilator support.  MV: 9 L/min Temp (24hrs), Avg:99.3 F (37.4 C), Min:98 F (36.7 C), Max:100 F (37.8 C)   Height: Ht Readings from Last 1 Encounters:  06/19/13 5\' 5"  (1.651 m)    Weight Status:   Wt Readings from Last 1 Encounters:  06/21/13 156 lb 15.5 oz (71.2 kg)  06/20/13  156 lb 15.5 oz (71.2 kg)   Re-estimated needs:  Kcal: 1550 Protein: 115-125 gm Fluid: 1.6-2 L  Skin: left shoulder incision  Diet Order: NPO   Intake/Output Summary (Last 24 hours) at 06/21/13 1046 Last data filed at 06/21/13 0800  Gross per 24 hour  Intake 2183.63 ml  Output   1315 ml  Net 868.63 ml    Last BM: unknown   Labs:   Recent Labs Lab 06/19/13 2319 06/20/13 0500 06/21/13 0518  NA 135 140 140  K 4.0 3.3* 4.2  CL 92* 103 105  CO2 18* 24 26  BUN 8 9 10   CREATININE 0.68 0.45* 0.57  CALCIUM 8.7 7.5* 8.2*  MG  --  1.7 1.8  PHOS  --  3.7 3.3  GLUCOSE 302* 219* 112*    CBG (last 3)   Recent Labs  06/20/13 2324 06/21/13 0317 06/21/13 0736  GLUCAP 118* 121* 121*    Scheduled Meds: . albuterol  2.5 mg Nebulization Q6H   And  . ipratropium  0.5 mg Nebulization Q6H  . antiseptic oral rinse  15 mL Mouth  Rinse QID  . atorvastatin  40 mg Oral q1800  . ceFEPime (MAXIPIME) IV  1 g Intravenous Q8H  . chlorhexidine  15 mL Mouth Rinse BID  . etomidate  10 mg Intravenous Once  . fentaNYL  50 mcg Intravenous Once  . heparin  5,000 Units Subcutaneous Q8H  . hydroxychloroquine  400 mg Oral Daily  . insulin aspart  0-15 Units Subcutaneous Q4H  . methylPREDNISolone (SOLU-MEDROL) injection  60 mg Intravenous Q6H  . pantoprazole (PROTONIX) IV  40 mg Intravenous QHS  . vancomycin  750 mg Intravenous Q12H    Continuous Infusions: . fentaNYL infusion INTRAVENOUS Stopped (06/21/13 0800)  . midazolam (VERSED) infusion Stopped (06/21/13 0800)  . norepinephrine (LEVOPHED) Adult infusion Stopped (06/20/13 0615)     Joaquin Courts, RD, LDN, CNSC Pager [phone] After Hours Pager [phone]

## 2013-06-22 ENCOUNTER — Inpatient Hospital Stay (HOSPITAL_COMMUNITY): Payer: Medicare HMO

## 2013-06-22 DIAGNOSIS — J9600 Acute respiratory failure, unspecified whether with hypoxia or hypercapnia: Secondary | ICD-10-CM

## 2013-06-22 LAB — CBC
HCT: 23.9 % — ABNORMAL LOW (ref 36.0–46.0)
Hemoglobin: 7.4 g/dL — ABNORMAL LOW (ref 12.0–15.0)
MCH: 31 pg (ref 26.0–34.0)
MCHC: 31 g/dL (ref 30.0–36.0)
MCV: 100 fL (ref 78.0–100.0)
Platelets: 314 10*3/uL (ref 150–400)
RDW: 15.8 % — ABNORMAL HIGH (ref 11.5–15.5)
WBC: 14.1 10*3/uL — ABNORMAL HIGH (ref 4.0–10.5)

## 2013-06-22 LAB — BASIC METABOLIC PANEL
BUN: 22 mg/dL (ref 6–23)
Calcium: 8.2 mg/dL — ABNORMAL LOW (ref 8.4–10.5)
Chloride: 106 mEq/L (ref 96–112)
Creatinine, Ser: 0.62 mg/dL (ref 0.50–1.10)
GFR calc Af Amer: >90 mL/min (ref >90)
GFR calc non Af Amer: >90 mL/min (ref >90)
Glucose, Bld: 144 mg/dL — ABNORMAL HIGH (ref 70–99)
Potassium: 3.9 mEq/L (ref 3.5–5.1)

## 2013-06-22 LAB — GLUCOSE, CAPILLARY
Glucose-Capillary: 133 mg/dL — ABNORMAL HIGH (ref 70–99)
Glucose-Capillary: 172 mg/dL — ABNORMAL HIGH (ref 70–99)
Glucose-Capillary: 150 mg/dL — ABNORMAL HIGH (ref 70–99)

## 2013-06-22 LAB — CULTURE, RESPIRATORY W GRAM STAIN

## 2013-06-22 MED ORDER — IPRATROPIUM BROMIDE 0.02 % IN SOLN
0.5000 mg | RESPIRATORY_TRACT | Status: DC
  Administered 2013-06-22 – 2013-06-25 (×20): 0.5 mg via RESPIRATORY_TRACT
  Filled 2013-06-22 (×24): qty 2.5

## 2013-06-22 MED ORDER — PANTOPRAZOLE SODIUM 40 MG PO PACK
40.0000 mg | PACK | Freq: Every day | ORAL | Status: DC
  Administered 2013-06-22: 40 mg
  Filled 2013-06-22 (×2): qty 20

## 2013-06-22 MED ORDER — METHYLPREDNISOLONE SODIUM SUCC 125 MG IJ SOLR
60.0000 mg | Freq: Two times a day (BID) | INTRAMUSCULAR | Status: DC
  Administered 2013-06-22 – 2013-06-24 (×4): 60 mg via INTRAVENOUS
  Filled 2013-06-22 (×6): qty 0.96

## 2013-06-22 MED ORDER — ACETYLCYSTEINE 20 % IN SOLN
3.0000 mL | RESPIRATORY_TRACT | Status: DC
  Administered 2013-06-22 – 2013-06-24 (×11): 3 mL via RESPIRATORY_TRACT
  Filled 2013-06-22 (×19): qty 4

## 2013-06-22 MED ORDER — CARVEDILOL 3.125 MG PO TABS
3.1250 mg | ORAL_TABLET | Freq: Two times a day (BID) | ORAL | Status: DC
  Administered 2013-06-22: 3.125 mg via ORAL
  Filled 2013-06-22 (×6): qty 1

## 2013-06-22 MED ORDER — ALBUTEROL SULFATE (2.5 MG/3ML) 0.083% IN NEBU
2.5000 mg | INHALATION_SOLUTION | RESPIRATORY_TRACT | Status: DC
  Administered 2013-06-22 – 2013-06-25 (×20): 2.5 mg via RESPIRATORY_TRACT
  Filled 2013-06-22: qty 3
  Filled 2013-06-22 (×5): qty 0.5
  Filled 2013-06-22: qty 3
  Filled 2013-06-22 (×4): qty 0.5
  Filled 2013-06-22: qty 3
  Filled 2013-06-22 (×4): qty 0.5
  Filled 2013-06-22: qty 3
  Filled 2013-06-22 (×3): qty 0.5
  Filled 2013-06-22: qty 3
  Filled 2013-06-22: qty 0.5
  Filled 2013-06-22: qty 3
  Filled 2013-06-22: qty 0.5
  Filled 2013-06-22: qty 3
  Filled 2013-06-22 (×6): qty 0.5
  Filled 2013-06-22: qty 3
  Filled 2013-06-22 (×2): qty 0.5

## 2013-06-22 NOTE — Progress Notes (Signed)
ANTIBIOTIC CONSULT NOTE - INITIAL  Pharmacy Consult for vancomycin and cefepime Indication: HAP ARDS  Allergies  Allergen Reactions  . Methocarbamol Nausea And Vomiting  . Morphine Sulfate Er Beads Other (See Comments)    confusion  . Aspirin Nausea And Vomiting  . Clarithromycin Other (See Comments)    Stomach problems   . Codeine Nausea And Vomiting  . Lyrica [Pregabalin] Other (See Comments)    Weight gain   . Morphine Other (See Comments)    Passed out  . Levaquin [Levofloxacin] Rash    Patient Measurements: Height: 5\' 5"  (165.1 cm) Weight: 161 lb 9.6 oz (73.3 kg) IBW/kg (Calculated) : 57 Adjusted Body Weight:   Vital Signs: Temp: 100.3 F (37.9 C) (12/25 0941) Temp src: Core (Comment) (12/25 0000) BP: 136/85 mmHg (12/25 0941) Pulse Rate: 109 (12/25 0941) Intake/Output from previous day: 12/24 0701 - 12/25 0700 In: 2338 [I.V.:978; NG/GT:960; IV Piggyback:400] Out: 700 [Urine:650; Emesis/NG output:50] Intake/Output from this shift: Total I/O In: 268 [I.V.:88; NG/GT:180] Out: 165 [Urine:165]  Labs:  Recent Labs  06/20/13 0500 06/21/13 0518 06/22/13 0430  WBC 18.9* 14.2* 14.1*  HGB 8.5* 7.3* 7.4*  PLT 431* 296 314  CREATININE 0.45* 0.57 0.62   Estimated Creatinine Clearance: 71.2 ml/min (by C-G formula based on Cr of 0.62). No results found for this basename: VANCOTROUGH, VANCOPEAK, VANCORANDOM, GENTTROUGH, GENTPEAK, GENTRANDOM, TOBRATROUGH, TOBRAPEAK, TOBRARND, AMIKACINPEAK, AMIKACINTROU, AMIKACIN,  in the last 72 hours   Microbiology: Recent Results (from the past 720 hour(s))  CULTURE, BLOOD (ROUTINE X 2)     Status: None   Collection Time    06/19/13 11:30 PM      Result Value Range Status   Specimen Description BLOOD PORTA CATH   Final   Special Requests BOTTLES DRAWN AEROBIC ONLY 10CC   Final   Culture  Setup Time     Final   Value: 06/20/2013 04:12     Performed at [facility]   Culture     Final   Value:        BLOOD CULTURE  RECEIVED NO GROWTH TO DATE CULTURE WILL BE HELD FOR 5 DAYS BEFORE ISSUING A FINAL NEGATIVE REPORT     Performed at [facility]   Report Status PENDING   Incomplete  CULTURE, BLOOD (ROUTINE X 2)     Status: None   Collection Time    06/20/13 12:25 AM      Result Value Range Status   Specimen Description BLOOD RIGHT ARM   Final   Special Requests BOTTLES DRAWN AEROBIC ONLY 10CC   Final   Culture  Setup Time     Final   Value: 06/20/2013 04:12     Performed at [facility]   Culture     Final   Value:        BLOOD CULTURE RECEIVED NO GROWTH TO DATE CULTURE WILL BE HELD FOR 5 DAYS BEFORE ISSUING A FINAL NEGATIVE REPORT     Performed at [facility]   Report Status PENDING   Incomplete  CULTURE, RESPIRATORY (NON-EXPECTORATED)     Status: None   Collection Time    06/20/13  3:04 AM      Result Value Range Status   Specimen Description TRACHEAL ASPIRATE   Final   Special Requests NONE   Final   Gram Stain     Final   Value: FEW WBC PRESENT,BOTH PMN AND MONONUCLEAR     NO SQUAMOUS EPITHELIAL CELLS SEEN  NO ORGANISMS SEEN     Performed at [facility]   Culture     Final   Value: Non-Pathogenic Oropharyngeal-type Flora Isolated.     Performed at [facility]   Report Status 06/22/2013 FINAL   Final  MRSA PCR SCREENING     Status: None   Collection Time    06/20/13  4:46 AM      Result Value Range Status   MRSA by PCR NEGATIVE  NEGATIVE Final   Comment:            The GeneXpert MRSA Assay (FDA     approved for NASAL specimens     only), is one component of a     comprehensive MRSA colonization     surveillance program. It is not     intended to diagnose MRSA     infection nor to guide or     monitor treatment for     MRSA infections.  CULTURE, RESPIRATORY (NON-EXPECTORATED)     Status: None   Collection Time    06/21/13 10:14 AM      Result Value Range Status   Specimen Description BRONCHIAL ALVEOLAR LAVAGE   Final   Special  Requests NONE   Final   Gram Stain     Final   Value: FEW WBC PRESENT, PREDOMINANTLY PMN     NO SQUAMOUS EPITHELIAL CELLS SEEN     NO ORGANISMS SEEN     Performed at [facility]   Culture     Final   Value: NO GROWTH 1 DAY     Performed at [facility]   Report Status PENDING   Incomplete    Medical History: Past Medical History  Diagnosis Date  . Migraine headache   . Anxiety   . Urticaria   . Low back pain syndrome   . Hyperlipidemia   . Hypercholesterolemia   . Depression   . Tobacco abuse   . Myalgia and myositis, unspecified   . Bipolar 1 disorder   . Allergy   . Asthma     per pt stated  . Blood transfusion     several x  . Cataract     recent surgery b/l  . Arthritis   . History of radiation therapy 04/11/12; 04/13/12; 04/15/12; 04/18/12; & 04/20/12    Rll lung 60Gy/5/fx  . GERD (gastroesophageal reflux disease)   . COPD (chronic obstructive pulmonary disease)     home o2 at 2l/min  . Shortness of breath     home o2 at 2l/min  . lung ca dx'd 11/2009    chemo comp 05/2010,home o2 at 2l/min  . Lung cancer     invasive left upper lobe adenocarcinoma  . Complication of anesthesia     low oxygen  . PONV (postoperative nausea and vomiting)   . Fracture of humerus, proximal, left, closed 06/16/2013    Medications:  Prescriptions prior to admission  Medication Sig Dispense Refill  . albuterol (PROVENTIL) (2.5 MG/3ML) 0.083% nebulizer solution Take 2.5 mg by nebulization every 6 (six) hours as needed for wheezing.      Marland Kitchen buPROPion (WELLBUTRIN) 100 MG tablet Take 100 mg by mouth 2 (two) times daily.      . clonazePAM (KLONOPIN) 1 MG tablet Take 1-2 mg by mouth 3 (three) times daily.       . DULoxetine (CYMBALTA) 60 MG capsule Take 60 mg by mouth daily before breakfast.       .  folic acid (FOLVITE) 1 MG tablet Take 1 mg by mouth daily.      . hydroxychloroquine (PLAQUENIL) 200 MG tablet Take 400 mg by mouth daily.       Marland Kitchen omeprazole (PRILOSEC) 20  MG capsule Take 20 mg by mouth daily.        Marland Kitchen oxyCODONE-acetaminophen (PERCOCET) 10-325 MG per tablet Take 1-2 tablets by mouth every 6 (six) hours as needed for pain.      Marland Kitchen PRESCRIPTION MEDICATION Take by mouth. Muscle Relaxer(s)      . promethazine (PHENERGAN) 25 MG tablet Take 1 tablet (25 mg total) by mouth every 6 (six) hours as needed for nausea or vomiting.  30 tablet  0  . simvastatin (ZOCOR) 40 MG tablet Take 40 mg by mouth daily.       . SUMAtriptan (IMITREX) 100 MG tablet Take 100 mg by mouth every 2 (two) hours as needed. Headache      . tiZANidine (ZANAFLEX) 2 MG tablet Take by mouth every 6 (six) hours as needed for muscle spasms.      . traMADol (ULTRAM) 50 MG tablet Take 50 mg by mouth every 6 (six) hours as needed for pain.      Marland Kitchen triamcinolone cream (KENALOG) 0.1 % Apply 1 application topically 2 (two) times daily as needed (for itching).       Marland Kitchen divalproex (DEPAKOTE) 250 MG DR tablet Take 750 mg by mouth at bedtime.      Marland Kitchen QUEtiapine (SEROQUEL) 25 MG tablet Take 25 mg by mouth at bedtime.         Assessment: Pt on Vancomycin andCefepime Day #3 for asp PNA. LA= 8.79 ->0.8. Tm 100.7. WBC down to 14.1. Renal function remains stable but UOP not great.  12/22 vanc>> 12/22 cefepime>>  12/23 Bld x2 >>ngtd  12/23 Trach asp >>ngtd  Neg flu, strep pneumo, legionella  Goal of Therapy:  Vancomycin trough level 15-20 mcg/ml  Plan:  - Continue Cefepime 1gm IV q8h. - Continue Vancomycin 750mg  IV q12h. Consider trough tomorrow if continues - f/u cx, renal fxn   Christoper Fabian, PharmD, BCPS Clinical pharmacist, pager [phone] 06/22/2013,10:58 AM

## 2013-06-22 NOTE — Progress Notes (Signed)
PULMONARY / CRITICAL CARE MEDICINE  Name: Maria Lucero MRN: [id number] DOB: [birth year]    ADMISSION DATE:  06/19/2013 CONSULTATION DATE:  06/19/2013  REFERRING MD :  EDP PRIMARY SERVICE:  PCCM  CHIEF COMPLAINT:  Acute respiratory failure  BRIEF PATIENT DESCRIPTION: 62 yo smoker with past medical history of COPD, lung CA and recent shoulder fracture ORIF admitted with hypercarbic respiratory failure and RLL pneumonia.  SIGNIFICANT EVENTS / STUDIES:  12/23  CT angio >>> The previously noted nodule along the right major fissure now measures 0.8 cm, increased in size from the prior study, and a new 0.9 cm nodule is seen more inferiorly along the right major fissure. Dense consolidation and volume loss at the left upper lobe is grossly unchanged in appearance 12/23  TTE >>> Limited study, EF 45%, anterior / anteroseptal WMA, mildly dilated RV with possible hypokinesis, PAP 55 torr  12/24  Bronchoscopy >>> mucus plugs L mainstem, severe tracheobronchomalacia   LINES / TUBES: OETT 12/23 >>> OGT 12/23 >>> Foley 12/23  >>> Medi-Port >>> R rad A-line 12/23 >>>  CULTURES: 12/23  MRSA PCR >>> neg 12/23 Blood >>> 12/23 Respiratory >>>  ANTIBIOTICS: Cefepime 12/23 >>> Vancomycin 12/23 >>>  INTERVAL HISTORY:  No overnight issues.  VITAL SIGNS: Temp:  [99 F (37.2 C)-100.4 F (38 C)] 100.4 F (38 C) (12/25 0600) Pulse Rate:  [78-116] 93 (12/25 0716) Resp:  [11-27] 12 (12/25 0716) BP: (85-155)/(36-96) 128/58 mmHg (12/25 0716) SpO2:  [94 %-100 %] 99 % (12/25 0716) Arterial Line BP: (88-174)/(45-111) 133/67 mmHg (12/25 0600) FiO2 (%):  [40 %-100 %] 40 % (12/25 0716) Weight:  [73.3 kg (161 lb 9.6 oz)] 73.3 kg (161 lb 9.6 oz) (12/25 0424) HEMODYNAMICS:   VENTILATOR SETTINGS: Vent Mode:  [-] PRVC FiO2 (%):  [40 %-100 %] 40 % Set Rate:  [12 bmp] 12 bmp Vt Set:  [460 mL] 460 mL PEEP:  [5 cmH20-10 cmH20] 5 cmH20 Pressure Support:  [5 cmH20] 5 cmH20 Plateau Pressure:  [19 cmH20-25  cmH20] 25 cmH20  INTAKE / OUTPUT: Intake/Output     12/24 0701 - 12/25 0700 12/25 0701 - 12/26 0700   I.V. (mL/kg) 934 (12.7)    NG/GT 900    IV Piggyback 400    Total Intake(mL/kg) 2234 (30.5)    Urine (mL/kg/hr) 650 (0.4)    Emesis/NG output 50 (0)    Total Output 700     Net +1534           PHYSICAL EXAMINATION: General:  Appears acutely ill, mechanically ventilated, synchronous Neuro:  Encephalopathic, but follows commands HEENT:  PERRL, OETT / OGT Cardiovascular:  RRR, no m/r/g Lungs:  Bilateral diminished air entry, few rhonchi Abdomen:  Soft, nontender, bowel sounds diminished Musculoskeletal:  Moves all extremities, no edema, sling LUE Skin:  Intact  LABS: CBC  Recent Labs Lab 06/20/13 0500 06/21/13 0518 06/22/13 0430  WBC 18.9* 14.2* 14.1*  HGB 8.5* 7.3* 7.4*  HCT 27.1* 23.3* 23.9*  PLT 431* 296 314   Coag's No results found for this basename: APTT, INR,  in the last 168 hours BMET  Recent Labs Lab 06/20/13 0500 06/21/13 0518 06/22/13 0430  NA 140 140 142  K 3.3* 4.2 3.9  CL 103 105 106  CO2 24 26 28   BUN 9 10 22   CREATININE 0.45* 0.57 0.62  GLUCOSE 219* 112* 144*   Electrolytes  Recent Labs Lab 06/20/13 0500 06/21/13 0518 06/22/13 0430  CALCIUM 7.5* 8.2* 8.2*  MG 1.7 1.8  --  PHOS 3.7 3.3  --    Sepsis Markers  Recent Labs Lab 06/19/13 2331 06/21/13 0830  LATICACIDVEN 8.79* 0.8   ABG  Recent Labs Lab 06/20/13 0352 06/21/13 0521 06/21/13 1138  PHART 7.294* 7.354 7.335*  PCO2ART 48.8* 54.5* 52.8*  PO2ART 31.0* 70.0* 271.0*   Liver Enzymes No results found for this basename: AST, ALT, ALKPHOS, BILITOT, ALBUMIN,  in the last 168 hours Cardiac Enzymes  Recent Labs Lab 06/20/13 0830 06/20/13 1246 06/21/13 0830  TROPONINI 0.68* 0.47* <0.30   Glucose  Recent Labs Lab 06/21/13 0736 06/21/13 1216 06/21/13 1549 06/21/13 1929 06/21/13 2322 06/22/13 0336  GLUCAP 121* 119* 130* 134* 159* 137*   CXR:  12/25 >>>  Hardware in good position, improved aeration  lung  ASSESSMENT / PLAN:  PULMONARY A:  Acute respiratory failure.  Suspected aspiration pneumonia. History of treated lung CA, now recurrent / enlarging RLL nodule. Opacification chest with volume loss, likely mucus plug / atelectasis - improved after bronchoscopy. Severe tracheobronchomalacia. Pulmonary hypertension. P:   Goal SpO2>92, pH>7.30 Full mechanical support Daily SBT, but need to discuss possible tracheostomy with family as lung function is marginal Ventilator bundle Trend ABG / CXR Albuterol / Atrovent change to q4h and PRN Add Mucomyst Solu-medrol change to q12h  CARDIOVASCULAR A: Septic shock - resolving, off vasopressors, now hypertensive.  NSTEMI. Prolonged QT. Acute ( on chronic?) systolic congestive heart failure. P:  Goal MAP>60 D/c Levophed gtt ASA intolerance Lipitor  Add Coreg 3.125 q12h Consider ACEI later  RENAL A:  No active issues. P:   Trend BMP  GASTROINTESTINAL A:  No active issues. P:   NPO as intubated Protonix for GI Px TF  HEMATOLOGIC A:  Anemia ( acute? chronic?) P:  Trend CBC Heparin for DVT Px  INFECTIOUS A:  Suspected aspiration pneumonia. P:   Cultures and antibiotics as above Preadmission Plaquenil  ENDOCRINE  A:  Hyperglycemia - stress, steroids.  P:   SSI  NEUROLOGIC A:  Acute encephalopathy. P:   Goal RASS 0 to -1 Daily WUA Fentanyl / Versed gtt Avoid Seroquel ( prolonged QT ) Hold Wellbutrin, Cymbalta, Klonopin, Depakote   I have personally obtained history, examined patient, evaluated and interpreted laboratory and imaging results, reviewed medical records, formulated assessment / plan and placed orders.  CRITICAL CARE:  The patient is critically ill with multiple organ systems failure and requires high complexity decision making for assessment and support, frequent evaluation and titration of therapies, application of advanced monitoring technologies and  extensive interpretation of multiple databases. Critical Care Time devoted to patient care services described in this note is 35 minutes.   Lonia Farber, MD Pulmonary and Critical Care Medicine [facility] Pager: [phone]  06/22/2013, 7:55 AM

## 2013-06-22 NOTE — Progress Notes (Signed)
Pt coughing/gagging on ETT.  Versed and fentanyl increased.

## 2013-06-23 ENCOUNTER — Inpatient Hospital Stay (HOSPITAL_COMMUNITY): Payer: Medicare HMO

## 2013-06-23 LAB — BASIC METABOLIC PANEL
CO2: 28 mEq/L (ref 19–32)
Calcium: 8.3 mg/dL — ABNORMAL LOW (ref 8.4–10.5)
Creatinine, Ser: 0.52 mg/dL (ref 0.50–1.10)
GFR calc Af Amer: >90 mL/min (ref >90)
GFR calc non Af Amer: >90 mL/min (ref >90)
Glucose, Bld: 142 mg/dL — ABNORMAL HIGH (ref 70–99)
Potassium: 4 mEq/L (ref 3.5–5.1)
Sodium: 144 mEq/L (ref 135–145)

## 2013-06-23 LAB — CBC
Hemoglobin: 7.4 g/dL — ABNORMAL LOW (ref 12.0–15.0)
MCH: 29.8 pg (ref 26.0–34.0)
Platelets: 342 10*3/uL (ref 150–400)
RBC: 2.48 MIL/uL — ABNORMAL LOW (ref 3.87–5.11)
RDW: 16 % — ABNORMAL HIGH (ref 11.5–15.5)

## 2013-06-23 LAB — CULTURE, RESPIRATORY W GRAM STAIN: Culture: NO GROWTH

## 2013-06-23 LAB — GLUCOSE, CAPILLARY
Glucose-Capillary: 131 mg/dL — ABNORMAL HIGH (ref 70–99)
Glucose-Capillary: 165 mg/dL — ABNORMAL HIGH (ref 70–99)
Glucose-Capillary: 107 mg/dL — ABNORMAL HIGH (ref 70–99)

## 2013-06-23 MED ORDER — CARVEDILOL 6.25 MG PO TABS
6.2500 mg | ORAL_TABLET | Freq: Two times a day (BID) | ORAL | Status: DC
  Administered 2013-06-23 – 2013-06-30 (×14): 6.25 mg via ORAL
  Filled 2013-06-23 (×17): qty 1

## 2013-06-23 MED ORDER — ONDANSETRON HCL 4 MG/2ML IJ SOLN
INTRAMUSCULAR | Status: AC
  Administered 2013-06-23: 4 mg
  Filled 2013-06-23: qty 2

## 2013-06-23 MED ORDER — ONDANSETRON HCL 4 MG/2ML IJ SOLN
4.0000 mg | Freq: Four times a day (QID) | INTRAMUSCULAR | Status: DC | PRN
  Administered 2013-06-23 – 2013-06-27 (×10): 4 mg via INTRAVENOUS
  Filled 2013-06-23 (×10): qty 2

## 2013-06-23 MED ORDER — MORPHINE SULFATE 2 MG/ML IJ SOLN
1.0000 mg | INTRAMUSCULAR | Status: DC | PRN
  Administered 2013-06-23 – 2013-06-25 (×3): 2 mg via INTRAVENOUS
  Filled 2013-06-23 (×4): qty 1

## 2013-06-23 MED ORDER — PROMETHAZINE HCL 25 MG/ML IJ SOLN
25.0000 mg | Freq: Once | INTRAMUSCULAR | Status: AC
  Administered 2013-06-23: 25 mg via INTRAVENOUS
  Filled 2013-06-23: qty 1

## 2013-06-23 MED ORDER — MORPHINE SULFATE 2 MG/ML IJ SOLN
INTRAMUSCULAR | Status: AC
  Filled 2013-06-23: qty 1

## 2013-06-23 MED ORDER — OXYCODONE HCL 5 MG PO TABS
5.0000 mg | ORAL_TABLET | Freq: Four times a day (QID) | ORAL | Status: DC | PRN
  Administered 2013-06-24 – 2013-06-30 (×17): 5 mg via ORAL
  Filled 2013-06-23 (×17): qty 1

## 2013-06-23 MED ORDER — PANTOPRAZOLE SODIUM 40 MG IV SOLR
40.0000 mg | INTRAVENOUS | Status: DC
  Administered 2013-06-23: 40 mg via INTRAVENOUS
  Filled 2013-06-23 (×3): qty 40

## 2013-06-23 MED ORDER — LISINOPRIL 2.5 MG PO TABS
2.5000 mg | ORAL_TABLET | Freq: Every day | ORAL | Status: DC
  Administered 2013-06-23 – 2013-06-30 (×8): 2.5 mg via ORAL
  Filled 2013-06-23 (×8): qty 1

## 2013-06-23 NOTE — Progress Notes (Signed)
Fentanyl and versed drip discontinued when patient was extubated. 200 mcg of Fentanyl and 20 mg of Versed wasted in the sink. Witness Linton Flemings, RN

## 2013-06-23 NOTE — Progress Notes (Signed)
PULMONARY / CRITICAL CARE MEDICINE  Name: Maria Lucero MRN: [id number] DOB: [birth year]    ADMISSION DATE:  06/19/2013 CONSULTATION DATE:  06/19/2013  REFERRING MD :  EDP PRIMARY SERVICE:  PCCM  CHIEF COMPLAINT:  Acute respiratory failure  BRIEF PATIENT DESCRIPTION: 62 yo smoker with past medical history of COPD, lung CA and recent shoulder fracture ORIF admitted with hypercarbic respiratory failure and RLL pneumonia.  SIGNIFICANT EVENTS / STUDIES:  12/23  CT angio >>> The previously noted nodule along the right major fissure now measures 0.8 cm, increased in size from the prior study, and a new 0.9 cm nodule is seen more inferiorly along the right major fissure. Dense consolidation and volume loss at the left upper lobe is grossly unchanged in appearance 12/23  TTE >>> Limited study, EF 45%, anterior / anteroseptal WMA, mildly dilated RV with possible hypokinesis, PAP 55 torr  12/24  Bronchoscopy >>> mucus plugs L mainstem, severe tracheobronchomalacia   LINES / TUBES: OETT 12/23 >>> OGT 12/23 >>> Foley 12/23  >>> Medi-Port >>> R rad A-line 12/23 >>> 12/25  CULTURES: 12/23  MRSA PCR >>> neg 12/23 Blood >>>  12/23 Respiratory >>> neg 12/24 BAL >>>  ANTIBIOTICS: Cefepime 12/23 >>> Vancomycin 12/23 >>> 12/26  INTERVAL HISTORY:  Doing well on SBT this am  VITAL SIGNS: Temp:  [98.9 F (37.2 C)-100.4 F (38 C)] 100 F (37.8 C) (12/26 0746) Pulse Rate:  [69-133] 110 (12/26 0746) Resp:  [10-22] 16 (12/26 0746) BP: (111-172)/(48-119) 146/74 mmHg (12/26 0746) SpO2:  [90 %-100 %] 99 % (12/26 0746) Arterial Line BP: (107-161)/(52-109) 161/109 mmHg (12/25 1700) FiO2 (%):  [40 %] 40 % (12/26 0800) Weight:  [74.6 kg (164 lb 7.4 oz)] 74.6 kg (164 lb 7.4 oz) (12/26 0100) HEMODYNAMICS:   VENTILATOR SETTINGS: Vent Mode:  [-] PSV;CPAP FiO2 (%):  [40 %] 40 % Set Rate:  [12 bmp] 12 bmp Vt Set:  [460 mL] 460 mL PEEP:  [5 cmH20] 5 cmH20 Pressure Support:  [5 cmH20-10 cmH20] 5  cmH20 Plateau Pressure:  [6 cmH20-21 cmH20] 21 cmH20  INTAKE / OUTPUT: Intake/Output     12/25 0701 - 12/26 0700 12/26 0701 - 12/27 0700   I.V. (mL/kg) 882 (11.8) 20 (0.3)   NG/GT 1575 60   IV Piggyback 400    Total Intake(mL/kg) 2857 (38.3) 80 (1.1)   Urine (mL/kg/hr) 1225 (0.7) 120 (1)   Emesis/NG output     Total Output 1225 120   Net +1632 -40         PHYSICAL EXAMINATION: General:  No distress on SBT Neuro:  Follows commands HEENT:  PERRL, OETT / OGT Cardiovascular:  RRR, no m/r/g Lungs:  Bilateral diminished air entry Abdomen:  Soft, nontender, bowel sounds diminished Musculoskeletal:  Mild generalized edema, sling LUE Skin:  Intact  LABS: CBC  Recent Labs Lab 06/21/13 0518 06/22/13 0430 06/23/13 0425  WBC 14.2* 14.1* 16.2*  HGB 7.3* 7.4* 7.4*  HCT 23.3* 23.9* 24.6*  PLT 296 314 342   Coag's No results found for this basename: APTT, INR,  in the last 168 hours BMET  Recent Labs Lab 06/21/13 0518 06/22/13 0430 06/23/13 0425  NA 140 142 144  K 4.2 3.9 4.0  CL 105 106 107  CO2 26 28 28   BUN 10 22 32*  CREATININE 0.57 0.62 0.52  GLUCOSE 112* 144* 142*   Electrolytes  Recent Labs Lab 06/20/13 0500 06/21/13 0518 06/22/13 0430 06/23/13 0425  CALCIUM 7.5* 8.2* 8.2* 8.3*  MG 1.7 1.8  --   --   PHOS 3.7 3.3  --   --    Sepsis Markers  Recent Labs Lab 06/19/13 2331 06/21/13 0830  LATICACIDVEN 8.79* 0.8   ABG  Recent Labs Lab 06/20/13 0352 06/21/13 0521 06/21/13 1138  PHART 7.294* 7.354 7.335*  PCO2ART 48.8* 54.5* 52.8*  PO2ART 31.0* 70.0* 271.0*   Liver Enzymes No results found for this basename: AST, ALT, ALKPHOS, BILITOT, ALBUMIN,  in the last 168 hours Cardiac Enzymes  Recent Labs Lab 06/20/13 0830 06/20/13 1246 06/21/13 0830  TROPONINI 0.68* 0.47* <0.30   Glucose  Recent Labs Lab 06/22/13 1203 06/22/13 1523 06/22/13 1944 06/22/13 2318 06/23/13 0323 06/23/13 0756  GLUCAP 172* 150* 131* 131* 146* 165*   CXR:   12/26 >>> Hardware in good position, slightly improved bilateral airspace disease  ASSESSMENT / PLAN:  PULMONARY A:  Acute respiratory failure.  Suspected aspiration pneumonia. History of treated lung CA, now recurrent / enlarging RLL nodule. Opacification chest with volume loss, likely mucus plug / atelectasis - improved after bronchoscopy. Severe tracheobronchomalacia. Pulmonary hypertension. P:   Goal SpO2>92, pH>7.30 Full mechanical support Daily SBT, will attempt extubation today Ventilator bundle Trend ABG / CXR Albuterol / Atrovent change to q4h and PRN Mucomyst Solu-medrol 60 q12h  CARDIOVASCULAR A: Septic shock - resolving, off vasopressors, now hypertensive.  NSTEMI. Prolonged QT. Acute ( on chronic?) systolic congestive heart failure. P:  Goal MAP>60 ASA intolerance Lipitor  Increase Coreg 6.25 q12h Add Lisinopril 2.5  RENAL A:  No active issues. P:   Trend BMP  GASTROINTESTINAL A:  No active issues. P:   NPO as intubated Protonix for GI Px TF, place on hold for possible extubation  HEMATOLOGIC A:  Anemia ( acute? chronic?) P:  Trend CBC Heparin for DVT Px  INFECTIOUS A:  Suspected aspiration pneumonia. P:   Cultures and antibiotics as above Preadmission Plaquenil D/c Vancomycin  ENDOCRINE  A:  Hyperglycemia - stress, steroids.  P:   SSI  NEUROLOGIC A:  Acute encephalopathy. P:   Goal RASS 0 to -1 Daily WUA Fentanyl / Versed gtt, place on hold for possible extubation Avoid Seroquel ( prolonged QT ) Hold Wellbutrin, Cymbalta, Klonopin, Depakote   I have personally obtained history, examined patient, evaluated and interpreted laboratory and imaging results, reviewed medical records, formulated assessment / plan and placed orders.  CRITICAL CARE:  The patient is critically ill with multiple organ systems failure and requires high complexity decision making for assessment and support, frequent evaluation and titration of therapies, application  of advanced monitoring technologies and extensive interpretation of multiple databases. Critical Care Time devoted to patient care services described in this note is 35 minutes.   Lonia Farber, MD Pulmonary and Critical Care Medicine [facility] Pager: [phone]  06/23/2013, 8:36 AM

## 2013-06-23 NOTE — Procedures (Signed)
Extubation Procedure Note  Patient Details:   Name: VERETTA SABOURIN DOB: 27-Jul-1950 MRN: [id number]   Airway Documentation:     Evaluation  O2 sats: stable throughout Complications: No apparent complications Patient did tolerate procedure well. Bilateral Breath Sounds: Clear;Diminished Suctioning: Airway Yes  Pt is stable, placed on 4L [place] and tolerating well. No complications.  Valleri, Hendricksen D 06/23/2013, 10:35 AM

## 2013-06-24 DIAGNOSIS — J9600 Acute respiratory failure, unspecified whether with hypoxia or hypercapnia: Secondary | ICD-10-CM

## 2013-06-24 LAB — BASIC METABOLIC PANEL
Calcium: 8.1 mg/dL — ABNORMAL LOW (ref 8.4–10.5)
Chloride: 103 mEq/L (ref 96–112)
Creatinine, Ser: 0.54 mg/dL (ref 0.50–1.10)
GFR calc Af Amer: >90 mL/min (ref >90)
GFR calc non Af Amer: >90 mL/min (ref >90)
Glucose, Bld: 110 mg/dL — ABNORMAL HIGH (ref 70–99)

## 2013-06-24 LAB — CBC
MCH: 30.2 pg (ref 26.0–34.0)
MCV: 100 fL (ref 78.0–100.0)
Platelets: 349 10*3/uL (ref 150–400)
RDW: 16.4 % — ABNORMAL HIGH (ref 11.5–15.5)
WBC: 16.4 10*3/uL — ABNORMAL HIGH (ref 4.0–10.5)

## 2013-06-24 LAB — GLUCOSE, CAPILLARY
Glucose-Capillary: 117 mg/dL — ABNORMAL HIGH (ref 70–99)
Glucose-Capillary: 107 mg/dL — ABNORMAL HIGH (ref 70–99)

## 2013-06-24 MED ORDER — BENZTROPINE MESYLATE 1 MG/ML IJ SOLN
1.0000 mg | Freq: Once | INTRAMUSCULAR | Status: AC
  Administered 2013-06-24: 1 mg via INTRAVENOUS
  Filled 2013-06-24: qty 1

## 2013-06-24 MED ORDER — PREDNISONE 20 MG PO TABS
40.0000 mg | ORAL_TABLET | Freq: Every day | ORAL | Status: DC
  Administered 2013-06-24 – 2013-06-26 (×3): 40 mg via ORAL
  Filled 2013-06-24 (×4): qty 2

## 2013-06-24 MED ORDER — ONDANSETRON HCL 4 MG/2ML IJ SOLN
4.0000 mg | Freq: Once | INTRAMUSCULAR | Status: AC
  Administered 2013-06-24: 4 mg via INTRAVENOUS
  Filled 2013-06-24: qty 2

## 2013-06-24 NOTE — Progress Notes (Signed)
eLink Physician-Brief Progress Note Patient Name: Maria Lucero DOB: Jan 26, 1951 MRN: [id number]  Date of Service  06/24/2013   HPI/Events of Note  Nausea not relieved by zofran, given phenergan x 1 dose with extrapyramidal reaction - eyes wide open, open mouth, less responsive   eICU Interventions  Given cogentin x1 IV with improvement Will list phenergan as allergy   Intervention Category Intermediate Interventions: Medication change / dose adjustment  [name] V. 06/24/2013, 12:53 AM

## 2013-06-24 NOTE — Progress Notes (Signed)
PULMONARY / CRITICAL CARE MEDICINE  Name: Maria Lucero MRN: [id number] DOB: 09-26-1950    ADMISSION DATE:  06/19/2013 CONSULTATION DATE:  06/19/2013  REFERRING MD :  EDP PRIMARY SERVICE:  PCCM  CHIEF COMPLAINT:  Acute respiratory failure  BRIEF PATIENT DESCRIPTION: 62 yo smoker with past medical history of COPD, lung CA and recent shoulder fracture ORIF admitted with hypercarbic respiratory failure and RLL pneumonia.  SIGNIFICANT EVENTS / STUDIES:  12/23  CT angio >>> The previously noted nodule along the right major fissure now measures 0.8 cm, increased in size from the prior study, and a new 0.9 cm nodule is seen more inferiorly along the right major fissure. Dense consolidation and volume loss at the left upper lobe is grossly unchanged in appearance 12/23  TTE >>> Limited study, EF 45%, anterior / anteroseptal WMA, mildly dilated RV with possible hypokinesis, PAP 55 torr  12/24  Bronchoscopy >>> mucus plugs L mainstem, severe tracheobronchomalacia  12/26  extubated  LINES / TUBES: OETT 12/23 >>> 12/26 OGT 12/23 >>> 12/26 Foley 12/23  >>> Medi-Port >>> R rad A-line 12/23 >>> 12/25  CULTURES: 12/23  MRSA PCR >>> neg 12/23 Blood >>>  12/23 Respiratory >>> neg 12/24 BAL >>> nad  ANTIBIOTICS: Cefepime 12/23 >>> Vancomycin 12/23 >>> 12/26  INTERVAL HISTORY:  Remains extubated. No distress overnight.  VITAL SIGNS: Temp:  [98.7 F (37.1 C)-100 F (37.8 C)] 99.4 F (37.4 C) (12/27 0600) Pulse Rate:  [93-130] 97 (12/27 0600) Resp:  [10-27] 11 (12/27 0600) BP: (106-213)/(52-122) 139/79 mmHg (12/27 0600) SpO2:  [95 %-100 %] 96 % (12/27 0600) FiO2 (%):  [40 %] 40 % (12/26 1000) Weight:  [76.9 kg (169 lb 8.5 oz)] 76.9 kg (169 lb 8.5 oz) (12/27 0500)  HEMODYNAMICS:   VENTILATOR SETTINGS: Vent Mode:  [-] PSV;CPAP FiO2 (%):  [40 %] 40 % PEEP:  [5 cmH20] 5 cmH20 Pressure Support:  [5 cmH20] 5 cmH20  INTAKE / OUTPUT: Intake/Output     12/26 0701 - 12/27 0700 12/27  0701 - 12/28 0700   P.O. 20    I.V. (mL/kg) 440 (5.7)    NG/GT 90    IV Piggyback 100    Total Intake(mL/kg) 650 (8.5)    Urine (mL/kg/hr) 1225 (0.7)    Total Output 1225     Net -575           PHYSICAL EXAMINATION: General:  Resting comfortable Neuro:  Sleepy, arouses to stimulation, appropriate HEENT:  PERRL Cardiovascular:  RRR, no m/r/g Lungs:  Bilateral diminished air entry Abdomen:  Soft, nontender, bowel sounds diminished Musculoskeletal:  Mild generalized edema, sling LUE Skin:  Intact  LABS: CBC  Recent Labs Lab 06/21/13 0518 06/22/13 0430 06/23/13 0425  WBC 14.2* 14.1* 16.2*  HGB 7.3* 7.4* 7.4*  HCT 23.3* 23.9* 24.6*  PLT 296 314 342   Coag's No results found for this basename: APTT, INR,  in the last 168 hours BMET  Recent Labs Lab 06/21/13 0518 06/22/13 0430 06/23/13 0425  NA 140 142 144  K 4.2 3.9 4.0  CL 105 106 107  CO2 26 28 28   BUN 10 22 32*  CREATININE 0.57 0.62 0.52  GLUCOSE 112* 144* 142*   Electrolytes  Recent Labs Lab 06/20/13 0500 06/21/13 0518 06/22/13 0430 06/23/13 0425  CALCIUM 7.5* 8.2* 8.2* 8.3*  MG 1.7 1.8  --   --   PHOS 3.7 3.3  --   --    Sepsis Markers  Recent Labs Lab 06/19/13 2331  06/21/13 0830  LATICACIDVEN 8.79* 0.8   ABG  Recent Labs Lab 06/20/13 0352 06/21/13 0521 06/21/13 1138  PHART 7.294* 7.354 7.335*  PCO2ART 48.8* 54.5* 52.8*  PO2ART 31.0* 70.0* 271.0*   Liver Enzymes No results found for this basename: AST, ALT, ALKPHOS, BILITOT, ALBUMIN,  in the last 168 hours Cardiac Enzymes  Recent Labs Lab 06/20/13 0830 06/20/13 1246 06/21/13 0830  TROPONINI 0.68* 0.47* <0.30   Glucose  Recent Labs Lab 06/23/13 0756 06/23/13 1118 06/23/13 1627 06/23/13 1933 06/23/13 2344 06/24/13 0409  GLUCAP 165* 118* 160* 107* 146* 87   CXR:  None today.  ASSESSMENT / PLAN:  PULMONARY A:  Acute respiratory failure.  Suspected aspiration pneumonia. History of treated lung CA, now recurrent /  enlarging RLL nodule. Opacification chest with volume loss, likely mucus plug / atelectasis - improved after bronchoscopy. Severe tracheobronchomalacia. Pulmonary hypertension. P:   Goal SpO2>92 Supplemental oxygen Albuterol / Atroven D/c Mucomyst D/c Solu-medrol 60 q12h Start Prednisone 40 Needs goals of care addressed given marginal baseline lung function  CARDIOVASCULAR A: Septic shock - resolving, off vasopressors, now hypertensive.  NSTEMI. Prolonged QT. Acute ( on chronic?) systolic congestive heart failure. P:  Goal MAP>60 ASA intolerance Lipitor  Coreg 6.25 q12h Lisinopril 2.5  RENAL A:  No active issues. P:   Trend BMP  GASTROINTESTINAL A:  No active issues. P:   Advance diet D/c Protonix as extubated  HEMATOLOGIC A:  Anemia ( acute? chronic?) P:  Trend CBC Heparin for DVT Px  INFECTIOUS A:  Suspected aspiration pneumonia. P:   Cultures and antibiotics as above Preadmission Plaquenil Continue abx x 10 days  ENDOCRINE  A:  Hyperglycemia - stress, steroids.  P:   SSI, change to ac&hs  NEUROLOGIC A:  Acute encephalopathy, resolving. Strong history of prescription drug abuse ( especially opioids ). Bipolar? Recent ortho surgery.  P:   Oxycodone / Morphine PRN Hold Wellbutrin, Cymbalta, Klonopin, Depakote   Transfer to SDU under PCCM.  I have personally obtained history, examined patient, evaluated and interpreted laboratory and imaging results, reviewed medical records, formulated assessment / plan and placed orders.  Lonia Farber, MD Pulmonary and Critical Care Medicine [facility] Pager: [phone]  06/24/2013, 7:28 AM

## 2013-06-24 NOTE — Progress Notes (Signed)
At 2345 patient given phenergan for ongoing nausea. Pt finished RT treatment of mucomyst, albuterol and atrovent. RT notified this RN that patient breathing pattern had changed after finished with breathing treatment at approximately 0000. Patient responds to pain and her name but will not answer when asked a question. Patient in NAD and hemodynamically stable throughout event.  Pharmacy notified of cogentin order and administered to patient at 12:50.

## 2013-06-25 LAB — GLUCOSE, CAPILLARY
Glucose-Capillary: 109 mg/dL — ABNORMAL HIGH (ref 70–99)
Glucose-Capillary: 119 mg/dL — ABNORMAL HIGH (ref 70–99)
Glucose-Capillary: 105 mg/dL — ABNORMAL HIGH (ref 70–99)
Glucose-Capillary: 107 mg/dL — ABNORMAL HIGH (ref 70–99)
Glucose-Capillary: 87 mg/dL (ref 70–99)

## 2013-06-25 LAB — BASIC METABOLIC PANEL
Chloride: 98 mEq/L (ref 96–112)
Creatinine, Ser: 0.68 mg/dL (ref 0.50–1.10)
GFR calc Af Amer: >90 mL/min (ref >90)
GFR calc non Af Amer: >90 mL/min (ref >90)
Potassium: 4.1 mEq/L (ref 3.5–5.1)

## 2013-06-25 LAB — CBC
HCT: 33.6 % — ABNORMAL LOW (ref 36.0–46.0)
MCH: 30.4 pg (ref 26.0–34.0)
MCV: 100.3 fL — ABNORMAL HIGH (ref 78.0–100.0)
Platelets: 418 10*3/uL — ABNORMAL HIGH (ref 150–400)
RDW: 17.2 % — ABNORMAL HIGH (ref 11.5–15.5)
WBC: 20.5 10*3/uL — ABNORMAL HIGH (ref 4.0–10.5)

## 2013-06-25 MED ORDER — INSULIN ASPART 100 UNIT/ML ~~LOC~~ SOLN
0.0000 [IU] | Freq: Every day | SUBCUTANEOUS | Status: DC
Start: 2013-06-25 — End: 2013-06-30

## 2013-06-25 MED ORDER — LORAZEPAM 2 MG/ML IJ SOLN
1.0000 mg | INTRAMUSCULAR | Status: DC | PRN
  Administered 2013-06-25 – 2013-06-29 (×5): 1 mg via INTRAVENOUS
  Filled 2013-06-25 (×5): qty 1

## 2013-06-25 MED ORDER — DIVALPROEX SODIUM 500 MG PO DR TAB
750.0000 mg | DELAYED_RELEASE_TABLET | Freq: Every day | ORAL | Status: DC
  Administered 2013-06-25 – 2013-06-29 (×5): 750 mg via ORAL
  Filled 2013-06-25 (×6): qty 1

## 2013-06-25 MED ORDER — CLONAZEPAM 1 MG PO TABS
1.0000 mg | ORAL_TABLET | Freq: Two times a day (BID) | ORAL | Status: DC
  Administered 2013-06-25 – 2013-06-30 (×11): 1 mg via ORAL
  Filled 2013-06-25 (×4): qty 2
  Filled 2013-06-25: qty 1
  Filled 2013-06-25: qty 2
  Filled 2013-06-25 (×2): qty 1
  Filled 2013-06-25: qty 2
  Filled 2013-06-25: qty 1
  Filled 2013-06-25: qty 2

## 2013-06-25 MED ORDER — MIDAZOLAM HCL 2 MG/2ML IJ SOLN
1.0000 mg | INTRAMUSCULAR | Status: DC | PRN
  Administered 2013-06-25: 1 mg via INTRAVENOUS
  Filled 2013-06-25: qty 2

## 2013-06-25 MED ORDER — INSULIN ASPART 100 UNIT/ML ~~LOC~~ SOLN
0.0000 [IU] | Freq: Three times a day (TID) | SUBCUTANEOUS | Status: DC
  Administered 2013-06-28: 2 [IU] via SUBCUTANEOUS

## 2013-06-25 NOTE — Addendum Note (Signed)
Addendum created 06/25/13 0751 by Kerby Nora, MD   Modules edited: Anesthesia Responsible Staff

## 2013-06-25 NOTE — Progress Notes (Signed)
PULMONARY / CRITICAL CARE MEDICINE  Name: Maria Lucero MRN: [id number] DOB: [birth year]    ADMISSION DATE:  06/19/2013 CONSULTATION DATE:  06/19/2013  REFERRING MD :  EDP PRIMARY SERVICE:  PCCM  CHIEF COMPLAINT:  Acute respiratory failure  BRIEF PATIENT DESCRIPTION: 62 yo smoker with past medical history of COPD, lung CA and recent shoulder fracture ORIF admitted with hypercarbic respiratory failure and RLL pneumonia.  SIGNIFICANT EVENTS / STUDIES:  12/23  CT angio >>> The previously noted nodule along the right major fissure now measures 0.8 cm, increased in size from the prior study, and a new 0.9 cm nodule is seen more inferiorly along the right major fissure. Dense consolidation and volume loss at the left upper lobe is grossly unchanged in appearance 12/23  TTE >>> Limited study, EF 45%, anterior / anteroseptal WMA, mildly dilated RV with possible hypokinesis, PAP 55 torr  12/24  Bronchoscopy >>> mucus plugs L mainstem, severe tracheobronchomalacia  12/26  extubated  LINES / TUBES: OETT 12/23 >>> 12/26 OGT 12/23 >>> 12/26 Foley 12/23  >>> Medi-Port >>> R rad A-line 12/23 >>> 12/25  CULTURES: 12/23  MRSA PCR >>> neg 12/23 Blood >>>  12/23 Respiratory >>> neg 12/24 BAL >>> nad  ANTIBIOTICS: Cefepime 12/23 >>> Vancomycin 12/23 >>> 12/26  INTERVAL HISTORY:  Some anxiety associated with chest pain overnight. No EKG changes.  VITAL SIGNS: Temp:  [98 F (36.7 C)-100 F (37.8 C)] 99 F (37.2 C) (12/28 0600) Pulse Rate:  [92-116] 105 (12/28 0600) Resp:  [12-30] 29 (12/28 0600) BP: (146-171)/(74-135) 157/85 mmHg (12/28 0600) SpO2:  [86 %-100 %] 100 % (12/28 0600)  HEMODYNAMICS:   VENTILATOR SETTINGS:    INTAKE / OUTPUT: Intake/Output     12/27 0701 - 12/28 0700 12/28 0701 - 12/29 0700   P.O.     I.V. (mL/kg) 460 (6)    NG/GT     IV Piggyback 150    Total Intake(mL/kg) 610 (7.9)    Urine (mL/kg/hr) 945 (0.5)    Total Output 945     Net -335            PHYSICAL EXAMINATION: General:  Appears anxious, somewhat tachyonic. Neuro:  Awake, alert, somewhat agitated HEENT:  PERRL Cardiovascular:  Tachycardic, regular Lungs:  Bilateral diminished air entry, few rales Abdomen:  Soft, nontender, bowel sounds present Musculoskeletal:  Mild generalized edema, sling LUE Skin:  Intact  LABS: CBC  Recent Labs Lab 06/23/13 0425 06/24/13 1225 06/25/13 0410  WBC 16.2* 16.4* 20.5*  HGB 7.4* 8.5* 10.2*  HCT 24.6* 28.1* 33.6*  PLT 342 349 418*   Coag's No results found for this basename: APTT, INR,  in the last 168 hours BMET  Recent Labs Lab 06/23/13 0425 06/24/13 1225 06/25/13 0410  NA 144 141 138  K 4.0 3.7 4.1  CL 107 103 98  CO2 28 29 27   BUN 32* 29* 38*  CREATININE 0.52 0.54 0.68  GLUCOSE 142* 110* 111*   Electrolytes  Recent Labs Lab 06/20/13 0500 06/21/13 0518  06/23/13 0425 06/24/13 1225 06/25/13 0410  CALCIUM 7.5* 8.2*  < > 8.3* 8.1* 8.9  MG 1.7 1.8  --   --   --   --   PHOS 3.7 3.3  --   --   --   --   < > = values in this interval not displayed. Sepsis Markers  Recent Labs Lab 06/19/13 2331 06/21/13 0830  LATICACIDVEN 8.79* 0.8   ABG  Recent Labs Lab 06/20/13  0981 06/21/13 0521 06/21/13 1138  PHART 7.294* 7.354 7.335*  PCO2ART 48.8* 54.5* 52.8*  PO2ART 31.0* 70.0* 271.0*   Liver Enzymes No results found for this basename: AST, ALT, ALKPHOS, BILITOT, ALBUMIN,  in the last 168 hours Cardiac Enzymes  Recent Labs Lab 06/20/13 1246 06/21/13 0830 06/25/13 0517  TROPONINI 0.47* <0.30 0.39*   Glucose  Recent Labs Lab 06/24/13 0704 06/24/13 1057 06/24/13 1509 06/24/13 1914 06/25/13 0008 06/25/13 0345  GLUCAP 102* 109* 117* 107* 109* 123*   CXR:  None today.  ASSESSMENT / PLAN:  PULMONARY A:  Acute respiratory failure.  Suspected aspiration pneumonia. History of treated lung CA, now recurrent / enlarging RLL nodule. Opacification chest with volume loss, likely mucus plug /  atelectasis - improved after bronchoscopy. Severe tracheobronchomalacia. Pulmonary hypertension. P:   Goal SpO2>92 Supplemental oxygen Albuterol / Atroven Prednisone 40  CARDIOVASCULAR A: Septic shock - resolving, off vasopressors, now hypertensive.  NSTEMI. Prolonged QT. Acute ( on chronic?) systolic congestive heart failure. P:  Goal MAP>60 ASA intolerance Lipitor  Coreg 6.25 q12h Lisinopril 2.5  RENAL A:  No active issues. P:   Trend BMP  GASTROINTESTINAL A:  No active issues. P:   Advance diet  HEMATOLOGIC A:  Anemia ( acute? chronic?) P:  Trend CBC Heparin for DVT Px  INFECTIOUS A:  Suspected aspiration pneumonia. P:   Cultures and antibiotics as above Preadmission Plaquenil Continue abx x 10 days  ENDOCRINE  A:  Hyperglycemia - stress, steroids.  P:   SSI  NEUROLOGIC A:  Acute encephalopathy, resolving. Strong history of prescription drug abuse ( especially opioids ). Bipolar? Recent ortho surgery.  P:   Oxycodone / Morphine PRN Hold Wellbutrin, Cymbalta Start Klonopin, Depakote D/c Versed Start Ativan PRN  SDU status. Suspect may not do well given very poor lung function and multiple medical / psychiatric comorbidities. Palliative care consulted to address goals of care and assist with symptomatic management.  I have personally obtained history, examined patient, evaluated and interpreted laboratory and imaging results, reviewed medical records, formulated assessment / plan and placed orders.  Lonia Farber, MD Pulmonary and Critical Care Medicine [facility] Pager: [phone]  06/25/2013, 7:31 AM

## 2013-06-25 NOTE — Progress Notes (Signed)
Patient Maria Lucero      DOB: 12/12/1950      AVW:[id number]  Left message for spouse to call me for goals of care. Will also go to the bedside.  Torien Ramroop L. Ladona Ridgel, MD MBA The Palliative Medicine Team at [facility] Phone: [phone] Pager: [phone]

## 2013-06-25 NOTE — Progress Notes (Signed)
Thank you for consulting the Palliative Medicine Team at [facility] to meet your patient's and family's needs.   The reason that you asked [place] to see your patient is  For GOC, advanced planning  We have scheduled your patient for a meeting: 12/29 at 5 pm  The Surrogate decision make [id number], spouse and daughter Contact information: see facesheet  Other family members that need to be present: at their discretion    Your patient is able/unable to participate: some  Additional Narrative:  Family not understanding the extent of patient's illness   Tristin Vandeusen L. Ladona Ridgel, MD MBA The Palliative Medicine Team at [facility] Phone: [phone] Pager: [phone]

## 2013-06-25 NOTE — Progress Notes (Signed)
Patient extremely agitated and anxious. Ativan given

## 2013-06-25 NOTE — Progress Notes (Signed)
eLink Physician-Brief Progress Note Patient Name: CARLETA WOODROW DOB: 09-Dec-1950 MRN: [id number]  Date of Service  06/25/2013   HPI/Events of Note   nonspec changes  eICU Interventions  trop      Nelda Bucks. 06/25/2013, 4:41 AM

## 2013-06-25 NOTE — Progress Notes (Signed)
eLink Physician-Brief Progress Note Patient Name: Maria Lucero DOB: 04/04/1951 MRN: [id number]  Date of Service  06/25/2013   HPI/Events of Note   Reports anxiety  eICU Interventions  Versed, ecg ( some cp)   Intervention Category Minor Interventions: Agitation / anxiety - evaluation and management  Nelda Bucks. 06/25/2013, 3:42 AM

## 2013-06-25 NOTE — Progress Notes (Signed)
ANTIBIOTIC CONSULT NOTE - FOLLOW UP  Pharmacy Consult for Cefepime Indication: aspiration PNA  Allergies  Allergen Reactions  . Methocarbamol Nausea And Vomiting  . Morphine Sulfate Er Beads Other (See Comments)    confusion  . Aspirin Nausea And Vomiting  . Clarithromycin Other (See Comments)    Stomach problems   . Codeine Nausea And Vomiting  . Lyrica [Pregabalin] Other (See Comments)    Weight gain   . Morphine Other (See Comments)    Passed out  . Phenergan [Promethazine Hcl]     Extrapyramidal reaction  . Levaquin [Levofloxacin] Rash    Patient Measurements: Height: 5\' 5"  (165.1 cm) Weight: 169 lb 8.5 oz (76.9 kg) IBW/kg (Calculated) : 57  Vital Signs: Temp: 99 F (37.2 C) (12/28 1400) BP: 127/63 mmHg (12/28 1400) Pulse Rate: 84 (12/28 1400) Intake/Output from previous day: 12/27 0701 - 12/28 0700 In: 630 [I.V.:480; IV Piggyback:150] Out: 945 [Urine:945] Intake/Output from this shift: Total I/O In: 100 [I.V.:100] Out: 100 [Urine:100]  Labs:  Recent Labs  06/23/13 0425 06/24/13 1225 06/25/13 0410  WBC 16.2* 16.4* 20.5*  HGB 7.4* 8.5* 10.2*  PLT 342 349 418*  CREATININE 0.52 0.54 0.68   Estimated Creatinine Clearance: 72.9 ml/min (by C-G formula based on Cr of 0.68). No results found for this basename: VANCOTROUGH, VANCOPEAK, VANCORANDOM, GENTTROUGH, GENTPEAK, GENTRANDOM, TOBRATROUGH, TOBRAPEAK, TOBRARND, AMIKACINPEAK, AMIKACINTROU, AMIKACIN,  in the last 72 hours   Microbiology: Recent Results (from the past 720 hour(s))  CULTURE, BLOOD (ROUTINE X 2)     Status: None   Collection Time    06/19/13 11:30 PM      Result Value Range Status   Specimen Description BLOOD PORTA CATH   Final   Special Requests BOTTLES DRAWN AEROBIC ONLY 10CC   Final   Culture  Setup Time     Final   Value: 06/20/2013 04:12     Performed at [facility]   Culture     Final   Value:        BLOOD CULTURE RECEIVED NO GROWTH TO DATE CULTURE WILL BE HELD FOR 5 DAYS  BEFORE ISSUING A FINAL NEGATIVE REPORT     Performed at [facility]   Report Status PENDING   Incomplete  CULTURE, BLOOD (ROUTINE X 2)     Status: None   Collection Time    06/20/13 12:25 AM      Result Value Range Status   Specimen Description BLOOD RIGHT ARM   Final   Special Requests BOTTLES DRAWN AEROBIC ONLY 10CC   Final   Culture  Setup Time     Final   Value: 06/20/2013 04:12     Performed at [facility]   Culture     Final   Value:        BLOOD CULTURE RECEIVED NO GROWTH TO DATE CULTURE WILL BE HELD FOR 5 DAYS BEFORE ISSUING A FINAL NEGATIVE REPORT     Performed at [facility]   Report Status PENDING   Incomplete  CULTURE, RESPIRATORY (NON-EXPECTORATED)     Status: None   Collection Time    06/20/13  3:04 AM      Result Value Range Status   Specimen Description TRACHEAL ASPIRATE   Final   Special Requests NONE   Final   Gram Stain     Final   Value: FEW WBC PRESENT,BOTH PMN AND MONONUCLEAR     NO SQUAMOUS EPITHELIAL CELLS SEEN     NO ORGANISMS  SEEN     Performed at [facility]   Culture     Final   Value: Non-Pathogenic Oropharyngeal-type Flora Isolated.     Performed at [facility]   Report Status 06/22/2013 FINAL   Final  MRSA PCR SCREENING     Status: None   Collection Time    06/20/13  4:46 AM      Result Value Range Status   MRSA by PCR NEGATIVE  NEGATIVE Final   Comment:            The GeneXpert MRSA Assay (FDA     approved for NASAL specimens     only), is one component of a     comprehensive MRSA colonization     surveillance program. It is not     intended to diagnose MRSA     infection nor to guide or     monitor treatment for     MRSA infections.  CULTURE, RESPIRATORY (NON-EXPECTORATED)     Status: None   Collection Time    06/21/13 10:14 AM      Result Value Range Status   Specimen Description BRONCHIAL ALVEOLAR LAVAGE   Final   Special Requests NONE   Final   Gram Stain     Final   Value: FEW WBC  PRESENT, PREDOMINANTLY PMN     NO SQUAMOUS EPITHELIAL CELLS SEEN     NO ORGANISMS SEEN     Performed at [facility]   Culture     Final   Value: NO GROWTH 2 DAYS     Performed at [facility]   Report Status 06/23/2013 FINAL   Final    Anti-infectives   Start     Dose/Rate Route Frequency Ordered Stop   06/20/13 1200  vancomycin (VANCOCIN) IVPB 750 mg/150 ml premix  Status:  Discontinued     750 mg 150 mL/hr over 60 Minutes Intravenous Every 12 hours 06/19/13 2356 06/23/13 0847   06/20/13 1000  hydroxychloroquine (PLAQUENIL) tablet 400 mg     400 mg Oral Daily 06/20/13 0459     06/20/13 0800  ceFEPIme (MAXIPIME) 1 g in dextrose 5 % 50 mL IVPB     1 g 100 mL/hr over 30 Minutes Intravenous 3 times per day 06/19/13 2358     06/20/13 0000  ceFEPIme (MAXIPIME) 1 g in dextrose 5 % 50 mL IVPB     1 g 100 mL/hr over 30 Minutes Intravenous  Once 06/19/13 2348 06/20/13 0045   06/20/13 0000  vancomycin (VANCOCIN) IVPB 1000 mg/200 mL premix     1,000 mg 200 mL/hr over 60 Minutes Intravenous NOW 06/19/13 2356 06/20/13 0100      Assessment: 62 year old female on Day #6 of an anticipated 10-day course of Cefepime for aspiration pneumonia.  Her renal function remains stable and her cultures are unrevealing.  Plan:  Continue Cefepime 1gm IV q8h Monitor renal function Follow available micro data Follow for duration of therapy  Estella Husk, Pharm.D., BCPS, AAHIVP Clinical Pharmacist Phone: [phone] or [phone] 06/25/2013, 2:54 PM

## 2013-06-25 NOTE — Progress Notes (Signed)
CRITICAL VALUE ALERT  Critical value received:  0.39 troponin  Date of notification:  06/25/2013  Time of notification:  0600  Critical value read back:yes  Nurse who received alert:  Winfred Leeds RN- relayed to this RN, Willia Craze RN  MD notified (1st page):  Tyson Alias MD  Time of first page:  0615  MD notified (2nd page):  Time of second page:  Responding MD:  Tyson Alias MD  Time MD responded:  [phone]

## 2013-06-26 LAB — CBC
Hemoglobin: 10.7 g/dL — ABNORMAL LOW (ref 12.0–15.0)
MCV: 99.1 fL (ref 78.0–100.0)
Platelets: 360 10*3/uL (ref 150–400)
RBC: 3.43 MIL/uL — ABNORMAL LOW (ref 3.87–5.11)
WBC: 17.1 10*3/uL — ABNORMAL HIGH (ref 4.0–10.5)

## 2013-06-26 LAB — BASIC METABOLIC PANEL
CO2: 29 mEq/L (ref 19–32)
Chloride: 100 mEq/L (ref 96–112)
GFR calc Af Amer: >90 mL/min (ref >90)
Potassium: 5.1 mEq/L (ref 3.5–5.1)
Sodium: 140 mEq/L (ref 135–145)

## 2013-06-26 LAB — GLUCOSE, CAPILLARY
Glucose-Capillary: 75 mg/dL (ref 70–99)
Glucose-Capillary: 71 mg/dL (ref 70–99)
Glucose-Capillary: 102 mg/dL — ABNORMAL HIGH (ref 70–99)
Glucose-Capillary: 113 mg/dL — ABNORMAL HIGH (ref 70–99)

## 2013-06-26 LAB — CULTURE, BLOOD (ROUTINE X 2): Culture: NO GROWTH

## 2013-06-26 MED ORDER — PREDNISONE 20 MG PO TABS
20.0000 mg | ORAL_TABLET | Freq: Every day | ORAL | Status: DC
  Administered 2013-06-27 – 2013-06-30 (×4): 20 mg via ORAL
  Filled 2013-06-26 (×5): qty 1

## 2013-06-26 MED ORDER — FUROSEMIDE 10 MG/ML IJ SOLN
40.0000 mg | Freq: Once | INTRAMUSCULAR | Status: AC
  Administered 2013-06-26: 40 mg via INTRAVENOUS
  Filled 2013-06-26: qty 4

## 2013-06-26 NOTE — Progress Notes (Signed)
NUTRITION FOLLOW UP  Intervention:    Recommend diet advancement to Dysphagia 3 with thin liquids per SLP.   Once diet advanced, if PO intake is poor, recommend add Ensure Complete PO BID.  Nutrition Dx:   Inadequate oral intake related to inability to eat as evidenced by NPO status. Ongoing.  Goal:   Intake to meet >90% of estimated nutrition needs. Unmet.  Monitor:   Diet advancement, PO intake, weight trend, labs.  Assessment:   PMHx significant for ORIF L shoulder, HA, anxiety, depression, bipolar 1 disorder, COPD, GERD, lung CA. Admitted with RLL PNA, per MD, pt also with probable recurrent lung CA.  Patient was extubated on 12/26. Discussed patient in ICU rounds today. Currently requiring oxygen via nasal cannula. Palliative Care Team is following patient with plans for a goals of care meeting with family later today. SLP following for swallowing difficulty; recommended diet upgrade to dysphagia 3 with thin liquids today.   Height: Ht Readings from Last 1 Encounters:  06/19/13 5\' 5"  (1.651 m)    Weight Status:   Wt Readings from Last 1 Encounters:  06/24/13 169 lb 8.5 oz (76.9 kg)  06/21/13  156 lb 15.5 oz (71.2 kg)  06/20/13  156 lb 15.5 oz (71.2 kg)   Re-estimated needs:  Kcal: 1550-1750 Protein: 100-115 gm Fluid: 1.6-2 L  Skin: left shoulder incision  Diet Order: NPO   Intake/Output Summary (Last 24 hours) at 06/26/13 1154 Last data filed at 06/26/13 1100  Gross per 24 hour  Intake    570 ml  Output    865 ml  Net   -295 ml    Last BM: unknown   Labs:   Recent Labs Lab 06/20/13 0500 06/21/13 0518  06/24/13 1225 06/25/13 0410 06/26/13 0720  NA 140 140  < > 141 138 140  K 3.3* 4.2  < > 3.7 4.1 5.1  CL 103 105  < > 103 98 100  CO2 24 26  < > 29 27 29   BUN 9 10  < > 29* 38* 36*  CREATININE 0.45* 0.57  < > 0.54 0.68 0.76  CALCIUM 7.5* 8.2*  < > 8.1* 8.9 8.5  MG 1.7 1.8  --   --   --   --   PHOS 3.7 3.3  --   --   --   --   GLUCOSE 219* 112*   < > 110* 111* 81  < > = values in this interval not displayed.  CBG (last 3)   Recent Labs  06/25/13 2330 06/26/13 0332 06/26/13 0828  GLUCAP 88 75 71    Scheduled Meds: . ipratropium  0.5 mg Nebulization Q4H   And  . albuterol  2.5 mg Nebulization Q4H  . atorvastatin  40 mg Oral q1800  . carvedilol  6.25 mg Oral BID WC  . ceFEPime (MAXIPIME) IV  1 g Intravenous Q8H  . clonazePAM  1 mg Oral BID  . divalproex  750 mg Oral QHS  . heparin  5,000 Units Subcutaneous Q8H  . hydroxychloroquine  400 mg Oral Daily  . insulin aspart  0-15 Units Subcutaneous TID WC  . insulin aspart  0-5 Units Subcutaneous QHS  . lisinopril  2.5 mg Oral Daily  . predniSONE  40 mg Oral Q breakfast    Continuous Infusions: None     Joaquin Courts, RD, LDN, CNSC Pager [phone] After Hours Pager [phone]

## 2013-06-26 NOTE — Consult Note (Signed)
Patient WJ:XBJYN [name]      DOB: 1950/10/25      WGN:[id number]   Summary of Goals of Care, full note to follow:  Met with patient , spouse, grandaughter, and daughter.  Reviewed patient underlying medical conditions connecting up the severity of her emphysema with her aspiration and explaining that even if her cancer has not progressed that we are faced with the reality that she will get sick again and that we could be facing the end of her life in 6 months or less.  We got to talk about how hospice might be helpful in bringing more quality and joy into her life.  Patient was able to vocalize that she knows how sick she is .  She specifically stated that when it is her time she wants to go on and does not what people to resuscitate her, what she means by that is if she has no pulse and is not breathing.  She is, however, torn about using a ventilator if she has a pulse and is in distress.  For now she states that she would still want the opportunity to have that support with bridging bipap.  She is not far , however , from making that change and will need the love and support of her family to actually assert that on her behalf.  At this time we will accept the limitations that she was able to verbalize- no CPR, no Defibrillation.  She was able to let me talk about the benefits of having hospice involved in her care.  She stated she did not want to go to hospice but that she would be ok with them coming to her home to care for her.  She gave me permission to see if we could have the psychiatrist review her medications and help with her bipolar disease and anxiety .  For now she has been able to tolerate low dose ativan and the return of her Depakote.  She was also wondering if Dr. [name] could look at her shoulder as she was supposed to see him next week anyway.  Recommend:  1.  Change code status to limited: NO CPR, No Defibrillation. Use Vent or bipap for respiratory distress for now.  Will continue to  work with her on this. Out of facility DNR should be DNR with stipulation on MOST form to use Bipap and intubation as a bridge if she is in respiratory distress but alive.   2.  Anxiety: agree with low dose ativan,  3. Bipolar : agree with Depakote to stabilize mood, consider psych consult to make long term recommendations  4.  Chronic pain: oxycodone every 6 prn.  Consider lidocaine patch to right hip.  She was thinking about whether to try this.   5. Emphysema: continue curative and maintenance treatments.    6.  Arthritis: continue plaquenil.   Total time: 500 pm- 630 pm  Chrisanne Loose L. Ladona Ridgel, MD MBA The Palliative Medicine Team at [facility] Phone: [phone] Pager: [phone]

## 2013-06-26 NOTE — Evaluation (Signed)
Clinical/Bedside Swallow Evaluation Patient Details  Name: Maria Lucero MRN: [id number] Date of Birth: 12-29-1950  Today's Date: 06/26/2013 Time: 4098-1191 SLP Time Calculation (min): 13 min  Past Medical History:  Past Medical History  Diagnosis Date  . Migraine headache   . Anxiety   . Urticaria   . Low back pain syndrome   . Hyperlipidemia   . Hypercholesterolemia   . Depression   . Tobacco abuse   . Myalgia and myositis, unspecified   . Bipolar 1 disorder   . Allergy   . Asthma     per pt stated  . Blood transfusion     several x  . Cataract     recent surgery b/l  . Arthritis   . History of radiation therapy 04/11/12; 04/13/12; 04/15/12; 04/18/12; & 04/20/12    Rll lung 60Gy/5/fx  . GERD (gastroesophageal reflux disease)   . COPD (chronic obstructive pulmonary disease)     home o2 at 2l/min  . Shortness of breath     home o2 at 2l/min  . lung ca dx'd 11/2009    chemo comp 05/2010,home o2 at 2l/min  . Lung cancer     invasive left upper lobe adenocarcinoma  . Complication of anesthesia     low oxygen  . PONV (postoperative nausea and vomiting)   . Fracture of humerus, proximal, left, closed 06/16/2013   Past Surgical History:  Past Surgical History  Procedure Laterality Date  . Back surgery      x 4 L3-S1 1996,2000, 2002  . Vesicovaginal fistula closure w/ tah    . Appendectomy    . Bronchoscopy  2011  . Lung biopsy  2011    needle  . Tonsillectomy    . Abdominal hysterectomy      with b/l oophoorectomy age 62  . Lumbar fusion    . Orif humerus fracture Left 06/16/2013    Procedure: OPEN REDUCTION INTERNAL FIXATION (ORIF) LEFT SHOULDER;  Surgeon: Eulas Post, MD;  Location: [facility] OR;  Service: Orthopedics;  Laterality: Left;   HPI:  62 y.o ex smoker s/p ORIF lt shoulder on 12/19, admitted for sudden onset sob, failed bipap & intubated (12/23-12/26) in ED for hypercarbic resp failure & RLL pneumonia.  PMH - bipolar, severe COPD on 2L [place] ([place]) with  h/o Unresectable stage IIB lung cancer s/p chemo-RT in 2011 & Curative radiotherapy to a right lower lobe nodule, neg PET 12/2012, GERD, prior admissions for resp failure attributed to narcotics & muscle relaxants, Difficult to control bipolar per family- will not go to psych.  CXR Mildly improved ventilation at the right lung base. Stable left lung with lower lobe hypoventilation superimposed on chronic left perihilar consolidation.   Assessment / Plan / Recommendation Clinical Impression  Pt.'s vocal qualtiy is clear and she denies odnophagia.  Oral phase of swallow is minimally impaired with delayed oral manipulation with cracker.  Do not recommend straws which transited the liquid too posterior in oral cavity and likely larger sip and resulting in delayed throat clear.  SLP recommends a Dys 3 diet texture with thin liquids, no straws, pills whole in applesauce and reflux precautions.  SLP encouraged pt. to ask family to bring upper denture plate to hospital.  SLP will continue to follow briefly to further ensure diet/liquid toleration and upgrade when appropriate.    Aspiration Risk  Mild    Diet Recommendation Dysphagia 3 (Mechanical Soft);Thin liquid   Liquid Administration via: Cup;No straw Medication Administration: Whole meds with  puree Supervision: Patient able to self feed;Intermittent supervision to cue for compensatory strategies Compensations: Slow rate;Small sips/bites Postural Changes and/or Swallow Maneuvers: Seated upright 90 degrees;Upright 30-60 min after meal    Other  Recommendations Oral Care Recommendations: Oral care BID   Follow Up Recommendations   (TBD)    Frequency and Duration min 1 x/week  2 weeks   Pertinent Vitals/Pain WDL         Swallow Study         Oral/Motor/Sensory Function Overall Oral Motor/Sensory Function: Appears within functional limits for tasks assessed   Ice Chips Ice chips: Not tested   Thin Liquid Thin Liquid:  Impaired Presentation: Cup;Straw Pharyngeal  Phase Impairments: Throat Clearing - Delayed    Nectar Thick Nectar Thick Liquid: Not tested   Honey Thick Honey Thick Liquid: Not tested   Puree Puree: Within functional limits   Solid   GO    Solid: Impaired Oral Phase Impairments:  (delayed transit mild)       Royce Macadamia M.Ed [employer] [phone]  06/26/2013

## 2013-06-26 NOTE — Progress Notes (Signed)
PULMONARY / CRITICAL CARE MEDICINE  Name: Maria Lucero MRN: [id number] DOB: [birth year]    ADMISSION DATE:  06/19/2013 CONSULTATION DATE:  06/19/2013  REFERRING MD :  EDP PRIMARY SERVICE:  PCCM  CHIEF COMPLAINT:  Acute respiratory failure  BRIEF PATIENT DESCRIPTION: 62 yo smoker with past medical history of COPD, lung CA and recent shoulder fracture ORIF admitted with hypercarbic respiratory failure and RLL pneumonia.  SIGNIFICANT EVENTS / STUDIES:  12/23  CT angio >>> The previously noted nodule along the right major fissure now measures 0.8 cm, increased in size from the prior study, and a new 0.9 cm nodule is seen more inferiorly along the right major fissure. Dense consolidation and volume loss at the left upper lobe is grossly unchanged in appearance 12/23  TTE >>> Limited study, EF 45%, anterior / anteroseptal WMA, mildly dilated RV with possible hypokinesis, PAP 55 torr  12/24  Bronchoscopy >>> mucus plugs L mainstem, severe tracheobronchomalacia  12/26  extubated  LINES / TUBES: OETT 12/23 >>> 12/26 OGT 12/23 >>> 12/26 Foley 12/23  >>> Medi-Port >>> R rad A-line 12/23 >>> 12/25  CULTURES: 12/23  MRSA PCR >>> neg 12/23 Blood >>>  12/23 Respiratory >>> neg 12/24 BAL >>> nad  ANTIBIOTICS: Cefepime 12/23 >>> Vancomycin 12/23 >>> 12/26  INTERVAL HISTORY:  Feels well, refusing breathing treatments  VITAL SIGNS: Temp:  [97.9 F (36.6 C)-99.3 F (37.4 C)] 98.6 F (37 C) (12/29 1500) Pulse Rate:  [77-102] 78 (12/29 1500) Resp:  [15-32] 15 (12/29 1500) BP: (106-156)/(62-105) 143/65 mmHg (12/29 1500) SpO2:  [91 %-100 %] 100 % (12/29 1500)  HEMODYNAMICS:   VENTILATOR SETTINGS:    INTAKE / OUTPUT: Intake/Output     12/28 0701 - 12/29 0700 12/29 0701 - 12/30 0700   I.V. (mL/kg) 460 (6) 80 (1)   IV Piggyback 150 50   Total Intake(mL/kg) 610 (7.9) 130 (1.7)   Urine (mL/kg/hr) 780 (0.4) 410 (0.6)   Total Output 780 410   Net -170 -280         PHYSICAL  EXAMINATION: General:  Comfortable HEENT: NCAT, EOMi PULM: Mild wheezing, poor air movement CV: RRR, no mgr AB: BS+, soft, nontender Ext: ankle edema Neuro: awake and alert, oriented x4  LABS: CBC  Recent Labs Lab 06/24/13 1225 06/25/13 0410 06/26/13 0720  WBC 16.4* 20.5* 17.1*  HGB 8.5* 10.2* 10.7*  HCT 28.1* 33.6* 34.0*  PLT 349 418* 360   Coag's No results found for this basename: APTT, INR,  in the last 168 hours BMET  Recent Labs Lab 06/24/13 1225 06/25/13 0410 06/26/13 0720  NA 141 138 140  K 3.7 4.1 5.1  CL 103 98 100  CO2 29 27 29   BUN 29* 38* 36*  CREATININE 0.54 0.68 0.76  GLUCOSE 110* 111* 81   Electrolytes  Recent Labs Lab 06/20/13 0500 06/21/13 0518  06/24/13 1225 06/25/13 0410 06/26/13 0720  CALCIUM 7.5* 8.2*  < > 8.1* 8.9 8.5  MG 1.7 1.8  --   --   --   --   PHOS 3.7 3.3  --   --   --   --   < > = values in this interval not displayed. Sepsis Markers  Recent Labs Lab 06/19/13 2331 06/21/13 0830  LATICACIDVEN 8.79* 0.8   ABG  Recent Labs Lab 06/20/13 0352 06/21/13 0521 06/21/13 1138  PHART 7.294* 7.354 7.335*  PCO2ART 48.8* 54.5* 52.8*  PO2ART 31.0* 70.0* 271.0*   Liver Enzymes No results found for this  basename: AST, ALT, ALKPHOS, BILITOT, ALBUMIN,  in the last 168 hours Cardiac Enzymes  Recent Labs Lab 06/20/13 1246 06/21/13 0830 06/25/13 0517  TROPONINI 0.47* <0.30 0.39*   Glucose  Recent Labs Lab 06/25/13 1501 06/25/13 1924 06/25/13 2330 06/26/13 0332 06/26/13 0828 06/26/13 1207  GLUCAP 107* 87 88 75 71 102*   CXR:  None today.  ASSESSMENT / PLAN:  PULMONARY A:  Acute respiratory failure due to pneumonia, severe COPD, tracheomalacia, mucus plugging Stage IIb medically non-resectable lung cancer diagnosed in 2011, treated with Chemo/XRT Overall her pulmonary condition is poor based on her severe COPD and tracheomalacia, would be a poor candidate for repeat mechanical ventilation P:   Incentive  spiro Out of bed Supplemental oxygen Albuterol / Atroven [place] prednisone  CARDIOVASCULAR A: Septic shock - resovled NSTEMI.  Likely diastolic dysfunction, slightly volume up 12/29 P:  Lasix x1 12/29 ASA intolerance Lipitor  Coreg 6.25 q12h Lisinopril 2.5  RENAL A:  No active issues. P:   Trend BMP  GASTROINTESTINAL A:  No active issues. P:   Advance diet  HEMATOLOGIC A:  Anemia ( acute? chronic?) P:  Trend CBC Heparin for DVT Px  INFECTIOUS A:  Suspected aspiration pneumonia. P:    Continue cefepime through Jun 29, 2013  ENDOCRINE  A:  Hyperglycemia - stress, steroids.  P:   SSI  NEUROLOGIC A:   Acute encephalopathy, resolved.  Strong history of prescription drug abuse ( especially opioids ).  Bipolar? Recent ortho surgery.  P:   Oxycodone / Morphine PRN Hold Wellbutrin, Cymbalta Continue Klonopin, Depakote Low dose Ativan PRN  Family/Goals of care: I explained to Ms. Maria Lucero and her husband today that there is minimal evidence that her lung cancer is worse, but that her baseline lung function is poor due to emphysema, tracheomalacia and mucus plugging.  Going back on the vent would be a very difficult process for her and she would be unlikely to make a swift recovery.  Despite 20-30 minutes of discussion I don't think that she grasps the significance of the conversation.  Hopefully Dr. Ladona Lucero can help the patient decide if she really wants to go through mechanical ventilation again, which I recommended against.  PCCM will follow  Maria Lucero PCCM Pager: [phone] Cell: [phone] If no response, call [phone]  06/26/2013, 3:58 PM

## 2013-06-27 DIAGNOSIS — F411 Generalized anxiety disorder: Secondary | ICD-10-CM

## 2013-06-27 DIAGNOSIS — I10 Essential (primary) hypertension: Secondary | ICD-10-CM | POA: Diagnosis present

## 2013-06-27 DIAGNOSIS — G8929 Other chronic pain: Secondary | ICD-10-CM | POA: Diagnosis present

## 2013-06-27 DIAGNOSIS — Z515 Encounter for palliative care: Secondary | ICD-10-CM

## 2013-06-27 DIAGNOSIS — J9602 Acute respiratory failure with hypercapnia: Secondary | ICD-10-CM | POA: Diagnosis present

## 2013-06-27 DIAGNOSIS — R110 Nausea: Secondary | ICD-10-CM | POA: Diagnosis present

## 2013-06-27 DIAGNOSIS — Z9889 Other specified postprocedural states: Secondary | ICD-10-CM

## 2013-06-27 LAB — BASIC METABOLIC PANEL
BUN: 26 mg/dL — ABNORMAL HIGH (ref 6–23)
CO2: 32 mEq/L (ref 19–32)
Calcium: 8.2 mg/dL — ABNORMAL LOW (ref 8.4–10.5)
Chloride: 96 mEq/L (ref 96–112)
Creatinine, Ser: 0.62 mg/dL (ref 0.50–1.10)
Glucose, Bld: 83 mg/dL (ref 70–99)
Sodium: 140 mEq/L (ref 137–147)

## 2013-06-27 LAB — GLUCOSE, CAPILLARY
Glucose-Capillary: 90 mg/dL (ref 70–99)
Glucose-Capillary: 93 mg/dL (ref 70–99)
Glucose-Capillary: 117 mg/dL — ABNORMAL HIGH (ref 70–99)
Glucose-Capillary: 115 mg/dL — ABNORMAL HIGH (ref 70–99)
Glucose-Capillary: 183 mg/dL — ABNORMAL HIGH (ref 70–99)

## 2013-06-27 MED ORDER — TRAMADOL HCL 50 MG PO TABS
50.0000 mg | ORAL_TABLET | Freq: Four times a day (QID) | ORAL | Status: DC | PRN
  Administered 2013-06-30: 50 mg via ORAL
  Filled 2013-06-27: qty 1

## 2013-06-27 MED ORDER — ONDANSETRON HCL 4 MG/2ML IJ SOLN
4.0000 mg | INTRAMUSCULAR | Status: DC | PRN
  Administered 2013-06-27 – 2013-06-30 (×10): 4 mg via INTRAVENOUS
  Filled 2013-06-27 (×11): qty 2

## 2013-06-27 MED ORDER — BOOST / RESOURCE BREEZE PO LIQD
1.0000 | Freq: Three times a day (TID) | ORAL | Status: DC
  Administered 2013-06-29 (×2): 1 via ORAL

## 2013-06-27 MED ORDER — ENSURE COMPLETE PO LIQD
237.0000 mL | Freq: Two times a day (BID) | ORAL | Status: DC
  Administered 2013-06-28 – 2013-06-30 (×3): 237 mL via ORAL

## 2013-06-27 MED ORDER — ALBUTEROL SULFATE (2.5 MG/3ML) 0.083% IN NEBU: 2.5000 mg | INHALATION_SOLUTION | RESPIRATORY_TRACT | Status: DC | PRN

## 2013-06-27 NOTE — Progress Notes (Signed)
Patient Maria Lucero      DOB: 1950-08-25      EXB:[id number]   Palliative Medicine Team at [facility] Progress Note    Subjective: Patient sitting in chair.  Said she had a fair day.  Daughter Maria Lucero called earlier this am with lots of questions .  She had not been at goals of care but did talk with her dad and sister.  Maria Lucero tells me it is ok to go ahead and get hospice involved at discharge.  States pain is okay , appetite better.  Filed Vitals:   06/27/13 2016  BP:   Pulse:   Temp: 98.2 F (36.8 C)  Resp:    Physical exam:   PERRL, EOMI, mm dry endentulous Chest : decreased with some rhonchi  Right shoulder in sling CVS: regular S1, S2 Abd: soft. Ext: left shoulder in sling, surgical site dressed Neuro: awake alert and calm. Oriented and seemingly recalls our conversation in full.     Assessment and plan:  62 yr old white female with very advanced COPD, aspiration , and Stage IIIB lung cancer.  She was admitted for Respiratory failure related to likely aspiration.  Patient is open to having hospice help care fore her related to COPD.  Patient had be working with speech therapy.  She is giving me permission to arrange hospice at home on discharge.  1.  No CPR but would like to use intubation if she has a pneumonia that could be treated.  2.  Shoulder surgery will update Dr. Shelba Flake office.  3. Will communicate with care manager regarding patient's wishes  For hospice at discharge.  4.  Bipolar: mood seemingly stable on current meds .  Would not hurt to have psych see her if primary team feels it is necessary.  Total time 25 min  Samad Thon L. Ladona Ridgel, MD MBA The Palliative Medicine Team at [facility] Phone: [phone] Pager: [phone]

## 2013-06-27 NOTE — Progress Notes (Signed)
PULMONARY / CRITICAL CARE MEDICINE  Name: Maria Lucero MRN: [id number] DOB: 07-Jul-1950    ADMISSION DATE:  06/19/2013 CONSULTATION DATE:  06/19/2013  REFERRING MD :  EDP PRIMARY SERVICE:  PCCM  CHIEF COMPLAINT:  Acute respiratory failure  BRIEF PATIENT DESCRIPTION: 62 yo smoker with past medical history of COPD, lung CA and recent shoulder fracture ORIF admitted with hypercarbic respiratory failure and RLL pneumonia.  SIGNIFICANT EVENTS / STUDIES:  12/23  CT angio >>> The previously noted nodule along the right major fissure now measures 0.8 cm, increased in size from the prior study, and a new 0.9 cm nodule is seen more inferiorly along the right major fissure. Dense consolidation and volume loss at the left upper lobe is grossly unchanged in appearance 12/23  TTE >>> Limited study, EF 45%, anterior / anteroseptal WMA, mildly dilated RV with possible hypokinesis, PAP 55 torr  12/24  Bronchoscopy >>> mucus plugs L mainstem, severe tracheobronchomalacia  12/26  extubated  LINES / TUBES: OETT 12/23 >>> 12/26 OGT 12/23 >>> 12/26 Foley 12/23  >>> Medi-Port >>> R rad A-line 12/23 >>> 12/25  CULTURES: 12/23  MRSA PCR >>> neg 12/23 Blood >>> neg 12/23 Respiratory >>> neg 12/24 BAL >>> nad  ANTIBIOTICS: Cefepime 12/23 >>> Vancomycin 12/23 >>> 12/26  INTERVAL HISTORY:  Breathing better.  VITAL SIGNS: Temp:  [97.4 F (36.3 C)-99 F (37.2 C)] 97.7 F (36.5 C) (12/30 1213) Pulse Rate:  [76-94] 83 (12/30 1213) Resp:  [15-28] 19 (12/30 1213) BP: (117-167)/(65-95) 143/75 mmHg (12/30 1213) SpO2:  [92 %-100 %] 99 % (12/30 1213) Weight:  [72 kg (158 lb 11.7 oz)] 72 kg (158 lb 11.7 oz) (12/29 1839)  HEMODYNAMICS:   VENTILATOR SETTINGS:   INTAKE / OUTPUT: Intake/Output     12/29 0701 - 12/30 0700 12/30 0701 - 12/31 0700   P.O.  118   I.V. (mL/kg) 120 (1.7)    IV Piggyback 100    Total Intake(mL/kg) 220 (3.1) 118 (1.6)   Urine (mL/kg/hr) 4035 (2.3)    Total Output 4035     Net -3815 +118         PHYSICAL EXAMINATION: General:  No distress Neuro:  Awake, alert, cooperative HEENT:  PERRL Cardiovascular:  Tachycardic, regular Lungs:  Fer rales / rhonchi bilaterally Abdomen:  Soft, nontender, bowel sounds present Musculoskeletal:  Mild generalized edema, sling LUE Skin:  Intact  LABS: CBC  Recent Labs Lab 06/24/13 1225 06/25/13 0410 06/26/13 0720  WBC 16.4* 20.5* 17.1*  HGB 8.5* 10.2* 10.7*  HCT 28.1* 33.6* 34.0*  PLT 349 418* 360   Coag's No results found for this basename: APTT, INR,  in the last 168 hours BMET  Recent Labs Lab 06/25/13 0410 06/26/13 0720 06/27/13 0410  NA 138 140 140  K 4.1 5.1 4.0  CL 98 100 96  CO2 27 29 32  BUN 38* 36* 26*  CREATININE 0.68 0.76 0.62  GLUCOSE 111* 81 83   Electrolytes  Recent Labs Lab 06/21/13 0518  06/25/13 0410 06/26/13 0720 06/27/13 0410  CALCIUM 8.2*  < > 8.9 8.5 8.2*  MG 1.8  --   --   --   --   PHOS 3.3  --   --   --   --   < > = values in this interval not displayed. Sepsis Markers  Recent Labs Lab 06/21/13 0830  LATICACIDVEN 0.8   ABG  Recent Labs Lab 06/21/13 0521 06/21/13 1138  PHART 7.354 7.335*  PCO2ART 54.5* 52.8*  PO2ART 70.0* 271.0*   Liver Enzymes No results found for this basename: AST, ALT, ALKPHOS, BILITOT, ALBUMIN,  in the last 168 hours Cardiac Enzymes  Recent Labs Lab 06/21/13 0830 06/25/13 0517  TROPONINI <0.30 0.39*   Glucose  Recent Labs Lab 06/26/13 1207 06/26/13 1530 06/26/13 1715 06/26/13 2207 06/27/13 0728 06/27/13 1220  GLUCAP 102* 94 90 113* 93 117*   CXR:  None today.  ASSESSMENT / PLAN:  PULMONARY A:   Acute respiratory failure - resolved.   Aspiration pneumonia - improving. COPD with exacerbation. History of treated lung CA, now recurrent / enlarging RLL nodule.  Severe tracheobronchomalacia.  Pulmonary hypertension. P:   Goal SpO2>92 Supplemental oxygen Albuterol / Atrovent Prednisone Intubation short term  is OK  CARDIOVASCULAR A:  Septic shock - resolved. NSTEMI.  Prolonged QT.  Acute ( on chronic?) systolic congestive heart failure. P:  Goal MAP>60 ASA intolerance Lipitor  Coreg 6.25 q12h Lisinopril 2.5 NO defibrillation / cardioversion / CPR  RENAL A:   No active issues. P:   Trend BMP  GASTROINTESTINAL A:   GI Px is not indicated. P:   Diet  HEMATOLOGIC A:   Anemia ( acute? Chronic?) VTE Px P:  Trend CBC Heparin [place]  INFECTIOUS A:   Aspiration pneumonia. P:   Cultures and antibiotics as above Preadmission Plaquenil Abx through 1/1  ENDOCRINE  A:   Hyperglycemia - stress, steroids.  P:   SSI  NEUROLOGIC A:   Acute encephalopathy, resolving.  Strong history of prescription drug abuse ( especially opioids ).  Bipolar?  Recent ortho surgery.  Anxiety.  P:   Oxycodone Tramadol PRN Hold Wellbutrin, Cymbalta Klonopin, Depakote Start Ativan PRN  On TRH service. PCCM will sign off.  I have personally obtained history, examined patient, evaluated and interpreted laboratory and imaging results, reviewed medical records, formulated assessment / plan and placed orders.  Lonia Farber, MD Pulmonary and Critical Care Medicine [facility] Pager: [phone]  06/27/2013, 1:40 PM

## 2013-06-27 NOTE — Progress Notes (Signed)
[facility] Progress Note [place] TEAM 1 - Stepdown ICU Team    Maria Lucero MVH:[id number] DOB: [birth year] DOA: 06/19/2013 PCP: [name], MD  Brief narrative: 62 y.o ex smoker s/p ORIF lt shoulder on 12/19 [name]). Presented to the ER for sudden onset SOB. She failed bipap & was intubated in ED for hypercarbic resp failure & RLL pneumonia. Her PMH is significant for bipolar d/o, severe COPD, Unresectable stage IIB lung cancer s/p chemo-RT in 2011 & Curative radiotherapy to a right lower lobe nodule, neg PET 12/2012. CT angios was completed that showed increase in size of previously noted nodules. Bronchoscopy was also completed that revealed mucous plugs in the left mainstem bronchus as well as severe tracheobronchomalacia. She stabilized and was able to be extubated on 06/23/2013. Pulmonologist determine her overall pulmonary condition was very poor and guarded and would be a poor candidate for repeat mechanical ventilation. Palliative medicine was consulted on 06/26/2013. Patient's status was changed to limited code with no CPR no defibrillation but patient and family wish to utilize ventilator or BiPAP for respiratory distress. Patient was deemed appropriate for transfer to the step down unit.   Assessment/Plan: Active Problems: Acute respiratory failure with hypercapnia due to:   A) Acute exacerbation of chronic obstructive pulmonary disease (COPD)   B) RLL pneumonia -stable on [place] oxygen -seems very deconditioned from a pulmonary standpoint -limited insight into severity and end point of her co-morbidities -cont supportive care and anbx's    ANXIETY/Bipolar disorder -cont home meds    HTN (hypertension) -not on meds pre admit -given likely pulmonary HTN consider add nitrate or hydralazine which may also help with dyspnea    History of shoulder surgery/Chronic pain -consider notify Dr. [name] of admit  -dc Morphine 2/2 allergy -resume home Ultram    Chronic  nausea -pt sts usual Klonopin helps -will increase frequency of Zofran   DVT prophylaxis: Subcutaneous heparin Code Status: Limited code: No CPR no defibrillation Family Communication: No family at bedside Disposition Plan/Expected LOS: Remain in step down Isolation: None Nutritional Status: Acute on chronic protein calorie malnutrition related to ongoing issues chronic nausea  Consultants: PCCM Palliative medicine  Procedures: 12/23 TTE >>> Limited study, EF 45%, anterior / anteroseptal WMA, mildly dilated RV with possible hypokinesis, PAP 55 torr   12/24 Bronchoscopy >>> mucus plugs L mainstem, severe tracheobronchomalacia   CULTURES:  12/23 MRSA PCR >>> neg  12/23 Blood >>>  12/23 Respiratory >>> neg  12/24 BAL >>> nad  Antibiotics: Cefepime 12/23 >>>  Vancomycin 12/23 >>> 12/26   HPI/Subjective: Patient alert and endorses generalized malaise and continued nausea. Also dyspnea with exertion even with movement in the bed.  Objective: Blood pressure 148/95, pulse 94, temperature 97.5 F (36.4 C), temperature source Oral, resp. rate 25, height 5\' 2"  (1.575 m), weight 158 lb 11.7 oz (72 kg), SpO2 100.00%.  Intake/Output Summary (Last 24 hours) at 06/27/13 1125 Last data filed at 06/27/13 0700  Gross per 24 hour  Intake    180 ml  Output   3750 ml  Net  -3570 ml     Exam: General: No acute respiratory distress Lungs: Clear to auscultation bilaterally without wheezes or crackles, 2L Cardiovascular: Regular rate and rhythm without murmur gallop or rub normal S1 and S2, no peripheral edema or JVD Abdomen: Nontender, nondistended, soft, bowel sounds positive, no rebound, no ascites, no appreciable mass Musculoskeletal: No significant cyanosis, clubbing of bilateral lower extremities Neurological: Alert and oriented x 3, moves all extremities  x 4 without focal neurological deficits but with generalized weakness, CN 2-12 intact  Scheduled Meds:  Scheduled Meds: .  atorvastatin  40 mg Oral q1800  . carvedilol  6.25 mg Oral BID WC  . ceFEPime (MAXIPIME) IV  1 g Intravenous Q8H  . clonazePAM  1 mg Oral BID  . divalproex  750 mg Oral QHS  . feeding supplement (ENSURE COMPLETE)  237 mL Oral BID BM  . feeding supplement (RESOURCE BREEZE)  1 Container Oral TID BM  . heparin  5,000 Units Subcutaneous Q8H  . hydroxychloroquine  400 mg Oral Daily  . insulin aspart  0-15 Units Subcutaneous TID WC  . insulin aspart  0-5 Units Subcutaneous QHS  . lisinopril  2.5 mg Oral Daily  . predniSONE  20 mg Oral Q breakfast   Continuous Infusions:   **Reviewed in detail by the Attending Physician  Data Reviewed: Basic Metabolic Panel:  Recent Labs Lab 06/21/13 0518  06/23/13 0425 06/24/13 1225 06/25/13 0410 06/26/13 0720 06/27/13 0410  NA 140  < > 144 141 138 140 140  K 4.2  < > 4.0 3.7 4.1 5.1 4.0  CL 105  < > 107 103 98 100 96  CO2 26  < > 28 29 27 29  32  GLUCOSE 112*  < > 142* 110* 111* 81 83  BUN 10  < > 32* 29* 38* 36* 26*  CREATININE 0.57  < > 0.52 0.54 0.68 0.76 0.62  CALCIUM 8.2*  < > 8.3* 8.1* 8.9 8.5 8.2*  MG 1.8  --   --   --   --   --   --   PHOS 3.3  --   --   --   --   --   --   < > = values in this interval not displayed. Liver Function Tests: No results found for this basename: AST, ALT, ALKPHOS, BILITOT, PROT, ALBUMIN,  in the last 168 hours No results found for this basename: LIPASE, AMYLASE,  in the last 168 hours No results found for this basename: AMMONIA,  in the last 168 hours CBC:  Recent Labs Lab 06/21/13 0518 06/22/13 0430 06/23/13 0425 06/24/13 1225 06/25/13 0410 06/26/13 0720  WBC 14.2* 14.1* 16.2* 16.4* 20.5* 17.1*  NEUTROABS 12.7*  --   --   --   --   --   HGB 7.3* 7.4* 7.4* 8.5* 10.2* 10.7*  HCT 23.3* 23.9* 24.6* 28.1* 33.6* 34.0*  MCV 97.5 100.0 99.2 100.0 100.3* 99.1  PLT 296 314 342 349 418* 360   Cardiac Enzymes:  Recent Labs Lab 06/20/13 1246 06/21/13 0830 06/25/13 0517  TROPONINI 0.47* <0.30  0.39*   BNP (last 3 results) No results found for this basename: PROBNP,  in the last 8760 hours CBG:  Recent Labs Lab 06/26/13 1207 06/26/13 1530 06/26/13 1715 06/26/13 2207 06/27/13 0728  GLUCAP 102* 94 90 113* 93    Recent Results (from the past 240 hour(s))  CULTURE, BLOOD (ROUTINE X 2)     Status: None   Collection Time    06/19/13 11:30 PM      Result Value Range Status   Specimen Description BLOOD PORTA CATH   Final   Special Requests BOTTLES DRAWN AEROBIC ONLY 10CC   Final   Culture  Setup Time     Final   Value: 06/20/2013 04:12     Performed at [facility]   Culture     Final   Value: NO GROWTH 5  DAYS     Performed at [facility]   Report Status 06/26/2013 FINAL   Final  CULTURE, BLOOD (ROUTINE X 2)     Status: None   Collection Time    06/20/13 12:25 AM      Result Value Range Status   Specimen Description BLOOD RIGHT ARM   Final   Special Requests BOTTLES DRAWN AEROBIC ONLY 10CC   Final   Culture  Setup Time     Final   Value: 06/20/2013 04:12     Performed at [facility]   Culture     Final   Value: NO GROWTH 5 DAYS     Performed at [facility]   Report Status 06/26/2013 FINAL   Final  CULTURE, RESPIRATORY (NON-EXPECTORATED)     Status: None   Collection Time    06/20/13  3:04 AM      Result Value Range Status   Specimen Description TRACHEAL ASPIRATE   Final   Special Requests NONE   Final   Gram Stain     Final   Value: FEW WBC PRESENT,BOTH PMN AND MONONUCLEAR     NO SQUAMOUS EPITHELIAL CELLS SEEN     NO ORGANISMS SEEN     Performed at [facility]   Culture     Final   Value: Non-Pathogenic Oropharyngeal-type Flora Isolated.     Performed at [facility]   Report Status 06/22/2013 FINAL   Final  MRSA PCR SCREENING     Status: None   Collection Time    06/20/13  4:46 AM      Result Value Range Status   MRSA by PCR NEGATIVE  NEGATIVE Final   Comment:            The GeneXpert MRSA Assay  (FDA     approved for NASAL specimens     only), is one component of a     comprehensive MRSA colonization     surveillance program. It is not     intended to diagnose MRSA     infection nor to guide or     monitor treatment for     MRSA infections.  CULTURE, RESPIRATORY (NON-EXPECTORATED)     Status: None   Collection Time    06/21/13 10:14 AM      Result Value Range Status   Specimen Description BRONCHIAL ALVEOLAR LAVAGE   Final   Special Requests NONE   Final   Gram Stain     Final   Value: FEW WBC PRESENT, PREDOMINANTLY PMN     NO SQUAMOUS EPITHELIAL CELLS SEEN     NO ORGANISMS SEEN     Performed at [facility]   Culture     Final   Value: NO GROWTH 2 DAYS     Performed at [facility]   Report Status 06/23/2013 FINAL   Final     Studies:  Recent x-ray studies have been reviewed in detail by the Attending Physician     Junious Silk, ANP [facility] Office  [phone] Pager [phone]  **If unable to reach the above provider after paging please contact the Flow Manager @ [phone]  On-Call/Text Page:      [email or web address]      password [email or web address]  If 7PM-7AM, please contact night-coverage [email or web address] Password [email or web address] 06/27/2013, 11:25 AM   LOS: 8 days   I have examined the patient, reviewed the chart and modified the above note which I agree with.  Tesla Keeler,MD [phone] 06/27/2013, 6:41 PM

## 2013-06-27 NOTE — Evaluation (Signed)
Physical Therapy Evaluation Patient Details Name: Maria Lucero MRN: [id number] DOB: 1950-09-09 Today's Date: 06/27/2013 Time: 4098-1191 PT Time Calculation (min): 25 min  PT Assessment / Plan / Recommendation History of Present Illness  62 y.o ex smoker s/p ORIF lt shoulder on 12/19 Maria Lucero). Presented to the ER for sudden onset SOB. She failed bipap & was intubated in ED for hypercarbic resp failure & RLL pneumonia. Her PMH is significant for bipolar d/o, severe COPD, Unresectable stage IIB lung cancer s/p chemo-RT in 2011 & Curative radiotherapy to a right lower lobe nodule, neg PET 12/2012. CT angios was completed that showed increase in size of previously noted nodules. Bronchoscopy was also completed that revealed mucous plugs in the left mainstem bronchus as well as severe tracheobronchomalacia. She stabilized and was able to be extubated on 06/23/2013. Pulmonologist determine her overall pulmonary condition was very poor and guarded and would be a poor candidate for repeat mechanical ventilation. Palliative medicine was consulted on 06/26/2013. Patient's status was changed to limited code with no CPR no defibrillation but patient and family wish to utilize ventilator or BiPAP for respiratory distress. Patient was deemed appropriate for transfer to the step down unit.  Clinical Impression   Pt admitted with above. Pt currently with functional limitations due to the deficits listed below (see PT Problem List).  Pt will benefit from skilled PT to increase their independence and safety with mobility to allow discharge to the venue listed below.       PT Assessment  Patient needs continued PT services    Follow Up Recommendations  Supervision/Assistance - 24 hour;Home health PT (HHOT; H[facility] -- )    Does the patient have the potential to tolerate intense rehabilitation      Barriers to Discharge   Will need more info re: available assist at home    Equipment Recommendations  Hospital  bed    Recommendations for Other Services OT consult   Frequency Min 3X/week    Precautions / Restrictions Precautions Precautions: Fall;Shoulder Required Braces or Orthoses: Sling (at all times) Restrictions Weight Bearing Restrictions: Yes LUE Weight Bearing: Non weight bearing   Pertinent Vitals/Pain Session conducted on supplemental O2 Reported L UE pain/discomfort; did not rate patient repositioned for comfort Nausea; RN provided medication to assist with nausea and anxiety       Mobility  Bed Mobility Bed Mobility: Rolling Right;Right Sidelying to Sit;Sitting - Scoot to Edge of Bed Rolling Right: With rail;3: Mod assist Sitting - Scoot to Edge of Bed: 4: Min guard Details for Bed Mobility Assistance: Verbal cues for technique.  Assist to raise trunk to sitting position.  In sitting, patient initially lightheaded.  Passed with time. Transfers Transfers: Sit to Stand;Stand to Sit Sit to Stand: With upper extremity assist;From bed;1: +2 Total assist (for safety) Sit to Stand: Patient Percentage: 60% Stand to Sit: 4: Min assist;With upper extremity assist;With armrests;To chair/3-in-1 Details for Transfer Assistance: Verbal cues for use of RUE to assist with transfers.  Assist to rise to standing and for balance. Close guard of knees in case of buckle Ambulation/Gait Ambulation/Gait Assistance: 3: Mod assist Ambulation Distance (Feet): 3 Feet Assistive device: 2 person hand held assist Ambulation/Gait Assistance Details: Pivot steps bed to recliner; Pt states she feels quite weak Gait Pattern: Step-through pattern;Decreased stride length;Trunk flexed Gait velocity: Slow    Exercises     PT Diagnosis: Difficulty walking;Generalized weakness;Acute pain  PT Problem List: Decreased strength;Decreased range of motion;Decreased activity tolerance;Decreased balance;Decreased mobility;Decreased cognition;Decreased knowledge  of use of DME;Cardiopulmonary status limiting  activity;Pain PT Treatment Interventions: DME instruction;Gait training;Functional mobility training;Balance training;Patient/family education;Therapeutic activities;Therapeutic exercise     PT Goals(Current goals can be found in the care plan section) Acute Rehab PT Goals Patient Stated Goal: go home PT Goal Formulation: With patient Time For Goal Achievement: 07/11/13 Potential to Achieve Goals: Good  Visit Information  Last PT Received On: 06/27/13 Assistance Needed: +1 History of Present Illness: 62 y.o ex smoker s/p ORIF lt shoulder on 12/19 Maria Lucero). Presented to the ER for sudden onset SOB. She failed bipap & was intubated in ED for hypercarbic resp failure & RLL pneumonia. Her PMH is significant for bipolar d/o, severe COPD, Unresectable stage IIB lung cancer s/p chemo-RT in 2011 & Curative radiotherapy to a right lower lobe nodule, neg PET 12/2012. CT angios was completed that showed increase in size of previously noted nodules. Bronchoscopy was also completed that revealed mucous plugs in the left mainstem bronchus as well as severe tracheobronchomalacia. She stabilized and was able to be extubated on 06/23/2013. Pulmonologist determine her overall pulmonary condition was very poor and guarded and would be a poor candidate for repeat mechanical ventilation. Palliative medicine was consulted on 06/26/2013. Patient's status was changed to limited code with no CPR no defibrillation but patient and family wish to utilize ventilator or BiPAP for respiratory distress. Patient was deemed appropriate for transfer to the step down unit.       Prior Functioning  Home Living Family/patient expects to be discharged to:: Private residence Living Arrangements: Spouse/significant other Available Help at Discharge: Family;Available 24 hours/day (Patient reports husband is sick and unable to provide help.) Type of Home: House Home Access: Ramped entrance Home Layout: One level Home Equipment:  Walker - 2 wheels;Walker - 4 wheels;Wheelchair - manual Prior Function Level of Independence: Needs assistance Gait / Transfers Assistance Needed: Patient reports minimal ambulation pta.  Has been using w/c. ADL's / Homemaking Assistance Needed: Husband does housekeeping. Pt states that hse does her own sponge baths and dressing (wears gowns, pajamas and socks) Communication Communication: No difficulties Dominant Hand: Right    Cognition  Cognition Arousal/Alertness: Awake/alert Behavior During Therapy: Anxious Overall Cognitive Status: Within Functional Limits for tasks assessed Area of Impairment: Orientation;Memory    Extremity/Trunk Assessment Upper Extremity Assessment LUE Deficits / Details: Decreased ROM and strength - ORIF - in sling Lower Extremity Assessment Lower Extremity Assessment: Generalized weakness (Pt endorses tha RLE is weaker than L ) RLE Deficits / Details: Strength 3/5 Cervical / Trunk Assessment Cervical / Trunk Assessment: Normal   Balance Balance Balance Assessed: Yes Dynamic Sitting Balance Dynamic Sitting - Level of Assistance: 5: Stand by assistance Dynamic Standing Balance Dynamic Standing - Balance Support: Left upper extremity supported;Right upper extremity supported;During functional activity Dynamic Standing - Level of Assistance: 4: Min assist;3: Mod assist  End of Session PT - End of Session Equipment Utilized During Treatment: Gait belt;Oxygen Activity Tolerance: Patient limited by fatigue;Patient limited by pain Patient left: in chair;with call bell/phone within reach Nurse Communication: Mobility status  GP     Van Clines [facility] 06/27/2013, 5:14 PM [place], [place] [phone]

## 2013-06-28 ENCOUNTER — Inpatient Hospital Stay (HOSPITAL_COMMUNITY): Payer: Medicare HMO

## 2013-06-28 LAB — BASIC METABOLIC PANEL
Chloride: 101 mEq/L (ref 96–112)
GFR calc Af Amer: >90 mL/min (ref >90)
Potassium: 3.8 mEq/L (ref 3.7–5.3)
Sodium: 145 mEq/L (ref 137–147)

## 2013-06-28 LAB — GLUCOSE, CAPILLARY: Glucose-Capillary: 110 mg/dL — ABNORMAL HIGH (ref 70–99)

## 2013-06-28 LAB — CBC
MCH: 31.3 pg (ref 26.0–34.0)
Platelets: 345 10*3/uL (ref 150–400)
RDW: 17.5 % — ABNORMAL HIGH (ref 11.5–15.5)
WBC: 13.8 10*3/uL — ABNORMAL HIGH (ref 4.0–10.5)

## 2013-06-28 NOTE — Care Management Note (Signed)
    Page 1 of 1   06/28/2013     2:16:45 PM   CARE MANAGEMENT NOTE 06/28/2013  Patient:  Maria Lucero, Maria Lucero   Account Number:  [id number]  Date Initiated:  06/20/2013  Documentation initiated by:  [facility]  Subjective/Objective Assessment:   Admitted with SOB - progressed to intubation.     DC Planning Services  CM consult      Choice offered to / List presented to:  C-3 Spouse   HH arranged  HH-1 RN  HH-6 SOCIAL WORKER      HH agency  [facility]   Per UR Regulation:  Reviewed for med. necessity/level of care/duration of stay  Comments:  [place], [place] [phone]   [phone]                 Blaine Hamper Daughter     [phone]  06/28/13 1339 Verdis Prime RN MSN BSN CCM Received referral to anticipate d/c with hospice services. Talked with pt, spouse, and dtr who all state they want pt to  discharge home with hospice services.  Provided list of hospice agencies, referral made to [facility] per choice.

## 2013-06-28 NOTE — Procedures (Signed)
Objective Swallowing Evaluation: Modified Barium Swallowing Study  Patient Details  Name: Maria Lucero MRN: [id number] Date of Birth: 1951/01/13  Today's Date: 06/28/2013 Time: 1321-1340 SLP Time Calculation (min): 19 min  Past Medical History:  Past Medical History  Diagnosis Date  . Migraine headache   . Anxiety   . Urticaria   . Low back pain syndrome   . Hyperlipidemia   . Hypercholesterolemia   . Depression   . Tobacco abuse   . Myalgia and myositis, unspecified   . Bipolar 1 disorder   . Allergy   . Asthma     per pt stated  . Blood transfusion     several x  . Cataract     recent surgery b/l  . Arthritis   . History of radiation therapy 04/11/12; 04/13/12; 04/15/12; 04/18/12; & 04/20/12    Rll lung 60Gy/5/fx  . GERD (gastroesophageal reflux disease)   . COPD (chronic obstructive pulmonary disease)     home o2 at 2l/min  . Shortness of breath     home o2 at 2l/min  . lung ca dx'd 11/2009    chemo comp 05/2010,home o2 at 2l/min  . Lung cancer     invasive left upper lobe adenocarcinoma  . Complication of anesthesia     low oxygen  . PONV (postoperative nausea and vomiting)   . Fracture of humerus, proximal, left, closed 06/16/2013   Past Surgical History:  Past Surgical History  Procedure Laterality Date  . Back surgery      x 4 L3-S1 1996,2000, 2002  . Vesicovaginal fistula closure w/ tah    . Appendectomy    . Bronchoscopy  2011  . Lung biopsy  2011    needle  . Tonsillectomy    . Abdominal hysterectomy      with b/l oophoorectomy age 54  . Lumbar fusion    . Orif humerus fracture Left 06/16/2013    Procedure: OPEN REDUCTION INTERNAL FIXATION (ORIF) LEFT SHOULDER;  Surgeon: Eulas Post, MD;  Location: [facility] OR;  Service: Orthopedics;  Laterality: Left;   HPI:  62 y.o ex smoker s/p ORIF lt shoulder on 12/19, admitted for sudden onset sob, failed bipap & intubated (12/23-12/26) in ED for hypercarbic resp failure & RLL pneumonia.  PMH - bipolar,  severe COPD on 2L [place] ([place]) with h/o Unresectable stage IIB lung cancer s/p chemo-RT in 2011 & Curative radiotherapy to a right lower lobe nodule, neg PET 12/2012, GERD, prior admissions for resp failure attributed to narcotics & muscle relaxants, Difficult to control bipolar per family- will not go to psych.  CXR Mildly improved ventilation at the right lung base. Stable left lung with lower lobe hypoventilation superimposed on chronic left perihilar consolidation.     Assessment / Plan / Recommendation Clinical Impression  Dysphagia Diagnosis: Mild pharyngeal phase dysphagia Clinical impression: Pt presents with a mild sensory-based pharyngeal dysphagia that appears to fluctuate with pt's respiratory status. Pharyngeal phase is marked by mild-moderately delayed swallow initiation at the level of the pyriform sinuses with all consistencies tested. Delayed initiation resulted in aspiration before the swallow of thin liquid in mixed consistency bolus, with cough resulting in what appeared to be increased SOB. With increased SOB pt demonstrated small amount of silent aspiration with straw sips of thin liquids; however, when pt's breathing returned to baseline, she was able to consume small cup sips of thin liquid with only flash penetration observed. SLP attempted chin tuck with thin liquids and cup sips of  nectar thick liquids, both of which appeared effective at reducing aspiration risk as well, and may be considered other options to pursue in the future. At this time, recommend to continue Dys 3 textures and thin liquids with no straws and extra pauses to allow for breathing. Skilled intervention included thorough education regarding results, recommendations, and aspiration risks.    Treatment Recommendation  Therapy as outlined in treatment plan below    Diet Recommendation Dysphagia 3 (Mechanical Soft);Thin liquid   Liquid Administration via: Cup;No straw Medication Administration: Whole meds with  puree Supervision: Patient able to self feed;Full supervision/cueing for compensatory strategies Compensations: Slow rate;Small sips/bites Postural Changes and/or Swallow Maneuvers: Seated upright 90 degrees    Other  Recommendations Oral Care Recommendations: Oral care BID   Follow Up Recommendations  None    Frequency and Duration min 1 x/week  1 week   Pertinent Vitals/Pain N/A    SLP Swallow Goals     General Date of Onset:  (chronic) HPI: 62 y.o ex smoker s/p ORIF lt shoulder on 12/19, admitted for sudden onset sob, failed bipap & intubated (12/23-12/26) in ED for hypercarbic resp failure & RLL pneumonia.  PMH - bipolar, severe COPD on 2L [place] ([place]) with h/o Unresectable stage IIB lung cancer s/p chemo-RT in 2011 & Curative radiotherapy to a right lower lobe nodule, neg PET 12/2012, GERD, prior admissions for resp failure attributed to narcotics & muscle relaxants, Difficult to control bipolar per family- will not go to psych.  CXR Mildly improved ventilation at the right lung base. Stable left lung with lower lobe hypoventilation superimposed on chronic left perihilar consolidation. Type of Study: Modified Barium Swallowing Study Reason for Referral: Objectively evaluate swallowing function Previous Swallow Assessment: clinical swallow evaluation 12/29 recommendations for Dys 3/thin liquids Diet Prior to this Study: Dysphagia 3 (soft);Thin liquids Temperature Spikes Noted: No Respiratory Status: Nasal cannula (2L) History of Recent Intubation: Yes Date extubated: 06/23/13 Behavior/Cognition: Alert;Pleasant mood;Cooperative Oral Cavity - Dentition: Dentures, bottom;Dentures, not available (top dentures still not in hospital) Self-Feeding Abilities: Able to feed self Patient Positioning: Upright in chair Baseline Vocal Quality: Clear Volitional Cough: Strong Volitional Swallow: Able to elicit Anatomy: Other (Comment) (suspect osteophytes C4-5; MD not present to  confirm) Pharyngeal Secretions: Not observed secondary MBS    Reason for Referral Objectively evaluate swallowing function   Oral Phase Oral Preparation/Oral Phase Oral Phase: WFL   Pharyngeal Phase Pharyngeal Phase Pharyngeal Phase: Impaired Pharyngeal - Nectar Pharyngeal - Nectar Cup: Delayed swallow initiation Pharyngeal - Thin Pharyngeal - Thin Cup: Delayed swallow initiation;Reduced airway/laryngeal closure;Penetration/Aspiration during swallow Penetration/Aspiration details (thin cup): Material enters airway, remains ABOVE vocal cords then ejected out Pharyngeal - Thin Straw: Delayed swallow initiation;Reduced airway/laryngeal closure;Penetration/Aspiration before swallow Penetration/Aspiration details (thin straw): Material enters airway, passes BELOW cords and not ejected out despite cough attempt by patient;Material enters airway, passes BELOW cords without attempt by patient to eject out (silent aspiration) Pharyngeal - Solids Pharyngeal - Puree: Delayed swallow initiation Pharyngeal - Mechanical Soft: Delayed swallow initiation Pharyngeal - Pill: Delayed swallow initiation  Cervical Esophageal Phase    GO    Cervical Esophageal Phase Cervical Esophageal Phase: [facility]         Maxcine Ham, M.A. CCC-SLP [phone]  Maxcine Ham 06/28/2013, 2:28 PM

## 2013-06-28 NOTE — Progress Notes (Signed)
Late Entry Notified by [facility], family requesting services of [facility] at d/c; Note discharge date yet to be determined; [facility] is closed for agency holiday January 1; writer plans to f/u on Friday to address discharge needs;  Valente David, RN 06/28/2013, 5:50 PM [facility] RN Liaison [phone]

## 2013-06-28 NOTE — Progress Notes (Addendum)
[facility] Progress Note [place]  TEAM 1 - Stepdown ICU Team   Maria Lucero:[id number] DOB: 08/12/1950 DOA: 06/19/2013 PCP: [name], MD  Brief narrative: 62 y.o ex smoker s/p ORIF lt shoulder on 12/19 Maria Lucero). Presented to the ER for sudden onset SOB. She failed bipap & was intubated in ED for hypercarbic resp failure & RLL pneumonia. Her PMH is significant for bipolar d/o, severe COPD, Unresectable stage IIB lung cancer s/p chemo-RT in 2011 & Curative radiotherapy to a right lower lobe nodule, neg PET 12/2012. CT angios was completed that showed increase in size of previously noted nodules. Bronchoscopy was also completed that revealed mucous plugs in the left mainstem bronchus as well as severe tracheobronchomalacia. She stabilized and was able to be extubated on 06/23/2013. Pulmonologist determine her overall pulmonary condition was very poor and guarded and would be a poor candidate for repeat mechanical ventilation. Palliative medicine was consulted on 06/26/2013. Patient's status was changed to limited code with no CPR no defibrillation but patient and family wish to utilize ventilator or BiPAP for respiratory distress. Patient was deemed appropriate for transfer to the step down unit.  Assessment/Plan:  Acute respiratory failure with hypercapnia due to:   A) Acute exacerbation of chronic obstructive pulmonary disease (COPD)   B) RLL pneumonia -stable on [place] oxygen -seems very deconditioned from a pulmonary standpoint-PT rec HHPT -limited insight into severity and end point of her co-morbidities -cont supportive care and anbx's    ANXIETY/Bipolar disorder -cont home meds    HTN  -not on meds pre admit -given likely pulmonary HTN will add hydralazine if HTN remains poorly controlled     History of shoulder surgery/Chronic pain -PC has notified Dr. Dion Lucero of admit  -dc'd Morphine 2/2 allergy -resume home Ultram    Chronic nausea -pt sts usual Klonopin  helps -will increase frequency of Zofran   DVT prophylaxis: Subcutaneous heparin Code Status: Limited code: No CPR no defibrillation Family Communication: No family at bedside Disposition Plan/Expected LOS: Transfer to Floor - requested [facility] PT and hospital bed- ? Hospice will be provider after dc  Consultants: PCCM Palliative medicine  Procedures: 12/23 TTE >>> Limited study, EF 45%, anterior / anteroseptal WMA, mildly dilated RV with possible hypokinesis, PAP 55 torr   12/24 Bronchoscopy >>> mucus plugs L mainstem, severe tracheobronchomalacia   CULTURES:  12/23 MRSA PCR >>> neg  12/23 Blood >>>  12/23 Respiratory >>> neg  12/24 BAL >>> nad  Antibiotics: Cefepime 12/23 >>>  Vancomycin 12/23 >>> 12/26  HPI/Subjective: Patient alert and much more talkative today. Having trouble re-positioning self in bed.  Objective: Blood pressure 112/66, pulse 76, temperature 97.4 F (36.3 C), temperature source Oral, resp. rate 14, height 5\' 2"  (1.575 m), weight 158 lb 11.7 oz (72 kg), SpO2 99.00%.  Intake/Output Summary (Last 24 hours) at 06/28/13 1055 Last data filed at 06/28/13 0400  Gross per 24 hour  Intake    678 ml  Output   2125 ml  Net  -1447 ml   Exam: General: No acute respiratory distress Lungs: Clear to auscultation bilaterally without wheezes or crackles, 2L Cardiovascular: Regular rate and rhythm without murmur gallop or rub normal S1 and S2, no peripheral edema or JVD Abdomen: Nontender, nondistended, soft, bowel sounds positive, no rebound, no ascites, no appreciable mass Musculoskeletal: No significant cyanosis, clubbing of bilateral lower extremities-left arm in sling  Scheduled Meds:  Scheduled Meds: . atorvastatin  40 mg Oral q1800  . carvedilol  6.25 mg Oral  BID WC  . ceFEPime (MAXIPIME) IV  1 g Intravenous Q8H  . clonazePAM  1 mg Oral BID  . divalproex  750 mg Oral QHS  . feeding supplement (ENSURE COMPLETE)  237 mL Oral BID BM  . feeding supplement  (RESOURCE BREEZE)  1 Container Oral TID BM  . heparin  5,000 Units Subcutaneous Q8H  . hydroxychloroquine  400 mg Oral Daily  . insulin aspart  0-15 Units Subcutaneous TID WC  . insulin aspart  0-5 Units Subcutaneous QHS  . lisinopril  2.5 mg Oral Daily  . predniSONE  20 mg Oral Q breakfast    Data Reviewed: Basic Metabolic Panel:  Recent Labs Lab 06/24/13 1225 06/25/13 0410 06/26/13 0720 06/27/13 0410 06/28/13 0443  NA 141 138 140 140 145  K 3.7 4.1 5.1 4.0 3.8  CL 103 98 100 96 101  CO2 29 27 29  32 38*  GLUCOSE 110* 111* 81 83 81  BUN 29* 38* 36* 26* 18  CREATININE 0.54 0.68 0.76 0.62 0.59  CALCIUM 8.1* 8.9 8.5 8.2* 7.8*   Liver Function Tests: No results found for this basename: AST, ALT, ALKPHOS, BILITOT, PROT, ALBUMIN,  in the last 168 hours  CBC:  Recent Labs Lab 06/23/13 0425 06/24/13 1225 06/25/13 0410 06/26/13 0720 06/28/13 0443  WBC 16.2* 16.4* 20.5* 17.1* 13.8*  HGB 7.4* 8.5* 10.2* 10.7* 9.4*  HCT 24.6* 28.1* 33.6* 34.0* 30.1*  MCV 99.2 100.0 100.3* 99.1 100.3*  PLT 342 349 418* 360 345   Cardiac Enzymes:  Recent Labs Lab 06/25/13 0517  TROPONINI 0.39*   CBG:  Recent Labs Lab 06/27/13 0728 06/27/13 1220 06/27/13 1619 06/27/13 2103 06/28/13 0744  GLUCAP 93 117* 115* 183* 76    Recent Results (from the past 240 hour(s))  CULTURE, BLOOD (ROUTINE X 2)     Status: None   Collection Time    06/19/13 11:30 PM      Result Value Range Status   Specimen Description BLOOD PORTA CATH   Final   Special Requests BOTTLES DRAWN AEROBIC ONLY 10CC   Final   Culture  Setup Time     Final   Value: 06/20/2013 04:12     Performed at [facility]   Culture     Final   Value: NO GROWTH 5 DAYS     Performed at [facility]   Report Status 06/26/2013 FINAL   Final  CULTURE, BLOOD (ROUTINE X 2)     Status: None   Collection Time    06/20/13 12:25 AM      Result Value Range Status   Specimen Description BLOOD RIGHT ARM   Final    Special Requests BOTTLES DRAWN AEROBIC ONLY 10CC   Final   Culture  Setup Time     Final   Value: 06/20/2013 04:12     Performed at [facility]   Culture     Final   Value: NO GROWTH 5 DAYS     Performed at [facility]   Report Status 06/26/2013 FINAL   Final  CULTURE, RESPIRATORY (NON-EXPECTORATED)     Status: None   Collection Time    06/20/13  3:04 AM      Result Value Range Status   Specimen Description TRACHEAL ASPIRATE   Final   Special Requests NONE   Final   Gram Stain     Final   Value: FEW WBC PRESENT,BOTH PMN AND MONONUCLEAR     NO SQUAMOUS EPITHELIAL CELLS  SEEN     NO ORGANISMS SEEN     Performed at [facility]   Culture     Final   Value: Non-Pathogenic Oropharyngeal-type Flora Isolated.     Performed at [facility]   Report Status 06/22/2013 FINAL   Final  MRSA PCR SCREENING     Status: None   Collection Time    06/20/13  4:46 AM      Result Value Range Status   MRSA by PCR NEGATIVE  NEGATIVE Final   Comment:            The GeneXpert MRSA Assay (FDA     approved for NASAL specimens     only), is one component of a     comprehensive MRSA colonization     surveillance program. It is not     intended to diagnose MRSA     infection nor to guide or     monitor treatment for     MRSA infections.  CULTURE, RESPIRATORY (NON-EXPECTORATED)     Status: None   Collection Time    06/21/13 10:14 AM      Result Value Range Status   Specimen Description BRONCHIAL ALVEOLAR LAVAGE   Final   Special Requests NONE   Final   Gram Stain     Final   Value: FEW WBC PRESENT, PREDOMINANTLY PMN     NO SQUAMOUS EPITHELIAL CELLS SEEN     NO ORGANISMS SEEN     Performed at [facility]   Culture     Final   Value: NO GROWTH 2 DAYS     Performed at [facility]   Report Status 06/23/2013 FINAL   Final     Studies:  Recent x-ray studies have been reviewed in detail by the Attending Physician     Junious Silk, ANP [facility] Office  [phone] Pager [phone]  **If unable to reach the above provider after paging please contact the Flow Manager @ [phone]  On-Call/Text Page:      [email or web address]      password [email or web address]  If 7PM-7AM, please contact night-coverage [email or web address] Password [email or web address] 06/28/2013, 10:55 AM   LOS: 9 days   I have personally examined this patient and reviewed the entire database. I have reviewed the above note, made any necessary editorial changes, and agree with its content.  Lonia Blood, MD [facility]

## 2013-06-28 NOTE — Progress Notes (Signed)
Speech Language Pathology Treatment: Dysphagia  Patient Details Name: Maria Lucero MRN: [id number] DOB: 1951/04/07 Today's Date: 06/28/2013 Time: 0850-0907 SLP Time Calculation (min): 17 min  Assessment / Plan / Recommendation Clinical Impression  Patient seen for f/u diet tolerance assessment. Able to independently verbalize aspiration precaution for no straws to this SLP, required max cueing however for upright posture. Patient without any overt indication of aspiration with clinician provided po trials of soft solids, thin liquids however she is at risk for silent aspiration given h/o radiation to lungs, GERD, COPD. Discussed with patient and Dr. Ladona Ridgel. Based on chart review and discussion with MD, COPD and possible aspiration PNA appear to be patient's primary deficits at this time. Provided patient with education regarding risk factors for aspiration and possible plan of care including continuation of current diet vs proceeding with objective assessment of swallowing to determine least restrictive diet and swallowing strategies which may mitigate risk.  Patient wishes to proceed with MBS. MD and this SLP in agreement with the knowledge that goal will be to decreased but not eliminate risk of aspiration for improved quality of life. Will proceed with MBS at 1300.    HPI HPI: 62 y.o ex smoker s/p ORIF lt shoulder on 12/19, admitted for sudden onset sob, failed bipap & intubated (12/23-12/26) in ED for hypercarbic resp failure & RLL pneumonia.  PMH - bipolar, severe COPD on 2L [place] ([place]) with h/o Unresectable stage IIB lung cancer s/p chemo-RT in 2011 & Curative radiotherapy to a right lower lobe nodule, neg PET 12/2012, GERD, prior admissions for resp failure attributed to narcotics & muscle relaxants, Difficult to control bipolar per family- will not go to psych.  CXR Mildly improved ventilation at the right lung base. Stable left lung with lower lobe hypoventilation superimposed on chronic left  perihilar consolidation.      SLP Plan  MBS    Recommendations Diet recommendations: Regular;Thin liquid Liquids provided via: Cup;No straw Medication Administration: Whole meds with puree Supervision: Patient able to self feed;Intermittent supervision to cue for compensatory strategies Compensations: Slow rate;Small sips/bites;Follow solids with liquid Postural Changes and/or Swallow Maneuvers: Seated upright 90 degrees;Upright 30-60 min after meal              Oral Care Recommendations: Oral care BID Follow up Recommendations: None Plan: MBS    GO   Ferdinand Lango MA, CCC-SLP [phone]   Ezzard Ditmer Meryl 06/28/2013, 9:18 AM

## 2013-06-28 NOTE — Progress Notes (Addendum)
Patient MV:HQION Maria Lucero      DOB: [birth year]      GEX:[id number]  Noted CM on top of Hospice referral.  Called Dr. Shelba Flake office updated on her admission.  Spoke with  PA for doctor Landau.  They will try to see her on Friday.  They have asked me to take follow up xrays for them to review.  Left shoulder 2 view .  Will ask the patient's nurse to tell her about the xrays.  I have updated care management and primary service. Updated Dr. Shirline Frees on her admission and potential use of Hospice under her COPD diagnosis.   Euan Wandler L. Ladona Ridgel, MD MBA The Palliative Medicine Team at [facility] Phone: [phone] Pager: [phone]

## 2013-06-28 NOTE — Progress Notes (Signed)
ANTIBIOTIC CONSULT NOTE - FOLLOW UP  Pharmacy Consult for Cefepime Indication: aspiration PNA  Allergies  Allergen Reactions  . Methocarbamol Nausea And Vomiting  . Morphine Sulfate Er Beads Other (See Comments)    confusion  . Aspirin Nausea And Vomiting  . Clarithromycin Other (See Comments)    Stomach problems   . Codeine Nausea And Vomiting  . Lyrica [Pregabalin] Other (See Comments)    Weight gain   . Phenergan [Promethazine Hcl]     Extrapyramidal reaction  . Levaquin [Levofloxacin] Rash    Patient Measurements: Height: 5\' 2"  (157.5 cm) Weight: 158 lb 11.7 oz (72 kg) IBW/kg (Calculated) : 50.1  Vital Signs: Temp: 97.9 F (36.6 C) (12/31 1100) Temp src: Oral (12/31 1100) BP: 140/79 mmHg (12/31 1100) Pulse Rate: 88 (12/31 1100) Intake/Output from previous day: 12/30 0701 - 12/31 0700 In: 678 [P.O.:578; IV Piggyback:100] Out: 2125 [Urine:2125] Intake/Output from this shift: Total I/O In: -  Out: 700 [Urine:700]  Labs:  Recent Labs  06/26/13 0720 06/27/13 0410 06/28/13 0443  WBC 17.1*  --  13.8*  HGB 10.7*  --  9.4*  PLT 360  --  345  CREATININE 0.76 0.62 0.59   Estimated Creatinine Clearance: 66.1 ml/min (by C-G formula based on Cr of 0.59). No results found for this basename: VANCOTROUGH, Leodis Binet, VANCORANDOM, GENTTROUGH, GENTPEAK, GENTRANDOM, TOBRATROUGH, TOBRAPEAK, TOBRARND, AMIKACINPEAK, AMIKACINTROU, AMIKACIN,  in the last 72 hours   Microbiology: Recent Results (from the past 720 hour(s))  CULTURE, BLOOD (ROUTINE X 2)     Status: None   Collection Time    06/19/13 11:30 PM      Result Value Range Status   Specimen Description BLOOD PORTA CATH   Final   Special Requests BOTTLES DRAWN AEROBIC ONLY 10CC   Final   Culture  Setup Time     Final   Value: 06/20/2013 04:12     Performed at [facility]   Culture     Final   Value: NO GROWTH 5 DAYS     Performed at [facility]   Report Status 06/26/2013 FINAL   Final   CULTURE, BLOOD (ROUTINE X 2)     Status: None   Collection Time    06/20/13 12:25 AM      Result Value Range Status   Specimen Description BLOOD RIGHT ARM   Final   Special Requests BOTTLES DRAWN AEROBIC ONLY 10CC   Final   Culture  Setup Time     Final   Value: 06/20/2013 04:12     Performed at [facility]   Culture     Final   Value: NO GROWTH 5 DAYS     Performed at [facility]   Report Status 06/26/2013 FINAL   Final  CULTURE, RESPIRATORY (NON-EXPECTORATED)     Status: None   Collection Time    06/20/13  3:04 AM      Result Value Range Status   Specimen Description TRACHEAL ASPIRATE   Final   Special Requests NONE   Final   Gram Stain     Final   Value: FEW WBC PRESENT,BOTH PMN AND MONONUCLEAR     NO SQUAMOUS EPITHELIAL CELLS SEEN     NO ORGANISMS SEEN     Performed at [facility]   Culture     Final   Value: Non-Pathogenic Oropharyngeal-type Flora Isolated.     Performed at [facility]   Report Status 06/22/2013 FINAL   Final  MRSA PCR SCREENING     Status: None   Collection Time    06/20/13  4:46 AM      Result Value Range Status   MRSA by PCR NEGATIVE  NEGATIVE Final   Comment:            The GeneXpert MRSA Assay (FDA     approved for NASAL specimens     only), is one component of a     comprehensive MRSA colonization     surveillance program. It is not     intended to diagnose MRSA     infection nor to guide or     monitor treatment for     MRSA infections.  CULTURE, RESPIRATORY (NON-EXPECTORATED)     Status: None   Collection Time    06/21/13 10:14 AM      Result Value Range Status   Specimen Description BRONCHIAL ALVEOLAR LAVAGE   Final   Special Requests NONE   Final   Gram Stain     Final   Value: FEW WBC PRESENT, PREDOMINANTLY PMN     NO SQUAMOUS EPITHELIAL CELLS SEEN     NO ORGANISMS SEEN     Performed at [facility]   Culture     Final   Value: NO GROWTH 2 DAYS     Performed at [facility]    Report Status 06/23/2013 FINAL   Final    Anti-infectives   Start     Dose/Rate Route Frequency Ordered Stop   06/20/13 1200  vancomycin (VANCOCIN) IVPB 750 mg/150 ml premix  Status:  Discontinued     750 mg 150 mL/hr over 60 Minutes Intravenous Every 12 hours 06/19/13 2356 06/23/13 0847   06/20/13 1000  hydroxychloroquine (PLAQUENIL) tablet 400 mg     400 mg Oral Daily 06/20/13 0459     06/20/13 0800  ceFEPIme (MAXIPIME) 1 g in dextrose 5 % 50 mL IVPB     1 g 100 mL/hr over 30 Minutes Intravenous 3 times per day 06/19/13 2358     06/20/13 0000  ceFEPIme (MAXIPIME) 1 g in dextrose 5 % 50 mL IVPB     1 g 100 mL/hr over 30 Minutes Intravenous  Once 06/19/13 2348 06/20/13 0045   06/20/13 0000  vancomycin (VANCOCIN) IVPB 1000 mg/200 mL premix     1,000 mg 200 mL/hr over 60 Minutes Intravenous NOW 06/19/13 2356 06/20/13 0100      Assessment: 62 year old female on Day 9 of an anticipated 10-day course of Cefepime for aspiration pneumonia. Her renal function remains stable and her cultures are unrevealing.  Plan:  - Continue Cefepime 1gm IV q8h - Monitor renal function, cultures, WBC, and temp  Debroh Sieloff A. Lenon Ahmadi, PharmD Clinical Pharmacist - Resident Pager: [phone] Pharmacy: [phone] 06/28/2013 12:18 PM

## 2013-06-29 LAB — GLUCOSE, CAPILLARY
Glucose-Capillary: 148 mg/dL — ABNORMAL HIGH (ref 70–99)
Glucose-Capillary: 62 mg/dL — ABNORMAL LOW (ref 70–99)
Glucose-Capillary: 79 mg/dL (ref 70–99)
Glucose-Capillary: 91 mg/dL (ref 70–99)
Glucose-Capillary: 102 mg/dL — ABNORMAL HIGH (ref 70–99)
Glucose-Capillary: 137 mg/dL — ABNORMAL HIGH (ref 70–99)

## 2013-06-29 NOTE — Progress Notes (Signed)
Patient complaining of urge to urinate while foley is in place.  Patient stating that she feels "full" but can not urinate.  Bladder scan performed with result of 82mL.  Foley has good urinary output.  Dr. Wynelle Cleveland spoke with patient about removing foley, but patient refused, stating that she could not get up to urinate.

## 2013-06-29 NOTE — Progress Notes (Signed)
[facility] Progress Note [place] TEAM 1 - Stepdown ICU Team   Maria Lucero NIO:[id number] DOB: August 15, 1950 DOA: 06/19/2013 PCP: Shirline Frees, MD  Brief narrative: 63 y.o ex smoker s/p ORIF lt shoulder on 12/19 [name]). Presented to the ER for sudden onset SOB. She failed bipap & was intubated in ED for hypercarbic resp failure & RLL pneumonia. Her PMH is significant for bipolar d/o, severe COPD, Unresectable stage IIB lung cancer s/p chemo-RT in 2011 & Curative radiotherapy to a right lower lobe nodule, neg PET 12/2012. CT angios was completed that showed increase in size of previously noted nodules. Bronchoscopy was also completed that revealed mucous plugs in the left mainstem bronchus as well as severe tracheobronchomalacia. She stabilized and was able to be extubated on 06/23/2013. Pulmonologist determine her overall pulmonary condition was very poor and guarded and would be a poor candidate for repeat mechanical ventilation. Palliative medicine was consulted on 06/26/2013. Patient's status was changed to limited code with no CPR no defibrillation but patient and family wish to utilize ventilator or BiPAP for respiratory distress. Patient was deemed appropriate for transfer to the step down unit.  Assessment/Plan:  Acute respiratory failure with hypercapnia due to:   A) Acute exacerbation of chronic obstructive pulmonary disease (COPD)   B) RLL pneumonia -stable on [place] oxygen -seems very deconditioned from a pulmonary standpoint- -limited insight into severity and end point of her co-morbidities -cont supportive care and anbx's    ANXIETY/Bipolar disorder -cont home meds    HTN  -not on meds pre admit -given likely pulmonary HTN will add hydralazine if HTN remains poorly controlled     History of shoulder surgery/Chronic pain -PC has notified Dr. [name] of admit  -dc'd Morphine 2/2 allergy -resume home Ultram    Chronic nausea -pt sts usual Klonopin helps -will  increase frequency of Zofran   DVT prophylaxis: Subcutaneous heparin Code Status: Limited code: No CPR no defibrillation Family Communication: No family at bedside Disposition Plan/Expected LOS:  -home with hospice  Consultants: PCCM Palliative medicine  Procedures: 12/23 TTE >>> Limited study, EF 45%, anterior / anteroseptal WMA, mildly dilated RV with possible hypokinesis, PAP 55 torr   12/24 Bronchoscopy >>> mucus plugs L mainstem, severe tracheobronchomalacia   CULTURES:  12/23 MRSA PCR >>> neg  12/23 Blood >>>  12/23 Respiratory >>> neg  12/24 BAL >>> nad  Antibiotics: Cefepime 12/23 >>>  Vancomycin 12/23 >>> 12/26  HPI/Subjective: Patient alert and, other than weakness, has no complaints.   Objective: Blood pressure 129/54, pulse 81, temperature 97.8 F (36.6 C), temperature source Oral, resp. rate 17, height 5\' 2"  (1.575 m), weight 69.6 kg (153 lb 7 oz), SpO2 94.00%.  Intake/Output Summary (Last 24 hours) at 06/29/13 1811 Last data filed at 06/29/13 1810  Gross per 24 hour  Intake   1190 ml  Output   2675 ml  Net  -1485 ml   Exam: General: No acute respiratory distress Lungs: Clear to auscultation bilaterally without wheezes or crackles, 2L Cardiovascular: Regular rate and rhythm without murmur gallop or rub normal S1 and S2, no peripheral edema or JVD Abdomen: Nontender, nondistended, soft, bowel sounds positive, no rebound, no ascites, no appreciable mass Musculoskeletal: No significant cyanosis, clubbing of bilateral lower extremities-left arm in sling  Scheduled Meds:  Scheduled Meds: . atorvastatin  40 mg Oral q1800  . carvedilol  6.25 mg Oral BID WC  . ceFEPime (MAXIPIME) IV  1 g Intravenous Q8H  . clonazePAM  1 mg Oral  BID  . divalproex  750 mg Oral QHS  . feeding supplement (ENSURE COMPLETE)  237 mL Oral BID BM  . feeding supplement (RESOURCE BREEZE)  1 Container Oral TID BM  . heparin  5,000 Units Subcutaneous Q8H  . hydroxychloroquine  400 mg  Oral Daily  . insulin aspart  0-15 Units Subcutaneous TID WC  . insulin aspart  0-5 Units Subcutaneous QHS  . lisinopril  2.5 mg Oral Daily  . predniSONE  20 mg Oral Q breakfast    Data Reviewed: Basic Metabolic Panel:  Recent Labs Lab 06/24/13 1225 06/25/13 0410 06/26/13 0720 06/27/13 0410 06/28/13 0443  NA 141 138 140 140 145  K 3.7 4.1 5.1 4.0 3.8  CL 103 98 100 96 101  CO2 29 27 29  32 38*  GLUCOSE 110* 111* 81 83 81  BUN 29* 38* 36* 26* 18  CREATININE 0.54 0.68 0.76 0.62 0.59  CALCIUM 8.1* 8.9 8.5 8.2* 7.8*   Liver Function Tests: No results found for this basename: AST, ALT, ALKPHOS, BILITOT, PROT, ALBUMIN,  in the last 168 hours  CBC:  Recent Labs Lab 06/23/13 0425 06/24/13 1225 06/25/13 0410 06/26/13 0720 06/28/13 0443  WBC 16.2* 16.4* 20.5* 17.1* 13.8*  HGB 7.4* 8.5* 10.2* 10.7* 9.4*  HCT 24.6* 28.1* 33.6* 34.0* 30.1*  MCV 99.2 100.0 100.3* 99.1 100.3*  PLT 342 349 418* 360 345   Cardiac Enzymes:  Recent Labs Lab 06/25/13 0517  TROPONINI 0.39*   CBG:  Recent Labs Lab 06/28/13 2237 06/29/13 0721 06/29/13 0800 06/29/13 1128 06/29/13 1537  GLUCAP 148* 62* 79 91 102*    Recent Results (from the past 240 hour(s))  CULTURE, BLOOD (ROUTINE X 2)     Status: None   Collection Time    06/19/13 11:30 PM      Result Value Range Status   Specimen Description BLOOD PORTA CATH   Final   Special Requests BOTTLES DRAWN AEROBIC ONLY 10CC   Final   Culture  Setup Time     Final   Value: 06/20/2013 04:12     Performed at [facility]   Culture     Final   Value: NO GROWTH 5 DAYS     Performed at [facility]   Report Status 06/26/2013 FINAL   Final  CULTURE, BLOOD (ROUTINE X 2)     Status: None   Collection Time    06/20/13 12:25 AM      Result Value Range Status   Specimen Description BLOOD RIGHT ARM   Final   Special Requests BOTTLES DRAWN AEROBIC ONLY 10CC   Final   Culture  Setup Time     Final   Value: 06/20/2013 04:12      Performed at [facility]   Culture     Final   Value: NO GROWTH 5 DAYS     Performed at [facility]   Report Status 06/26/2013 FINAL   Final  CULTURE, RESPIRATORY (NON-EXPECTORATED)     Status: None   Collection Time    06/20/13  3:04 AM      Result Value Range Status   Specimen Description TRACHEAL ASPIRATE   Final   Special Requests NONE   Final   Gram Stain     Final   Value: FEW WBC PRESENT,BOTH PMN AND MONONUCLEAR     NO SQUAMOUS EPITHELIAL CELLS SEEN     NO ORGANISMS SEEN     Performed at [facility]  Culture     Final   Value: Non-Pathogenic Oropharyngeal-type Flora Isolated.     Performed at [facility]   Report Status 06/22/2013 FINAL   Final  MRSA PCR SCREENING     Status: None   Collection Time    06/20/13  4:46 AM      Result Value Range Status   MRSA by PCR NEGATIVE  NEGATIVE Final   Comment:            The GeneXpert MRSA Assay (FDA     approved for NASAL specimens     only), is one component of a     comprehensive MRSA colonization     surveillance program. It is not     intended to diagnose MRSA     infection nor to guide or     monitor treatment for     MRSA infections.  CULTURE, RESPIRATORY (NON-EXPECTORATED)     Status: None   Collection Time    06/21/13 10:14 AM      Result Value Range Status   Specimen Description BRONCHIAL ALVEOLAR LAVAGE   Final   Special Requests NONE   Final   Gram Stain     Final   Value: FEW WBC PRESENT, PREDOMINANTLY PMN     NO SQUAMOUS EPITHELIAL CELLS SEEN     NO ORGANISMS SEEN     Performed at [facility]   Culture     Final   Value: NO GROWTH 2 DAYS     Performed at [facility]   Report Status 06/23/2013 FINAL   Final     Studies:  Recent x-ray studies have been reviewed in detail by the Attending Physician     Erin Hearing, ANP [facility] Office  [phone] Pager [phone]  **If unable to reach the above provider after paging please  contact the [place] @ [phone]  On-Call/Text Page:      [email or web address]      password [email or web address]  If 7PM-7AM, please contact night-coverage [email or web address] Password [email or web address] 06/29/2013, 6:11 PM   LOS: 10 days   I have personally examined this patient and reviewed the entire database. I have reviewed the above note, made any necessary editorial changes, and agree with its content.  Cherene Altes, MD [facility]

## 2013-06-29 NOTE — Progress Notes (Signed)
Patient CBG 62 at 7:30.  Patient given 15g carbs (4ML apple juice).  CBG resolved to 79.  Patient eating breakfast.  Will continue to monitor

## 2013-06-29 NOTE — Progress Notes (Signed)
Call received from patient's husband re:  1. He needs information on DME delivery-getting hospital bed, home care equipment- he only has help today with moving furniture etc.. 2. 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 Addiction etc.. And focus on QOL. 3. I have left message for CM to assist.  Will follow-up this afternoon.  Lane Hacker, DO Palliative Medicine

## 2013-06-29 NOTE — Progress Notes (Signed)
Palliative Medicine Team Progress Note  Received multiple calls from Mr. Begeman today re: discharge plans and questions about her psych meds-her pain meds and he also brought up issues that he is concerned about her being "addicted to pills". He is at home moving furniture and wanting DME delivered which will be difficult to achieve today-he reports being told she would be discharged at [facility] and he told me that she would have to sit in the floor-I reassured him she would not be discharged until he had the appropriate DME at his home to care for her and that we were able to coordinate with hospice. IO also provided extensive education about addiction, opiates and EOL issues-but I do not think he fully Marko Plume is asking for a formal psych evaluation hat was discussed in his original goals of care discussion with Dr. Maida Sale am not sure this will add much to her current care-goals are QOL, she is not decompensated- he asked about having her "committed to be cleaned out". Patient complaining to her husband that her "pain meds have been cut"-I am not opposed to increasing her oxycodone if indicated for her comfort and for dyspnea as well as prn ativan. Will defer psych consult to the primary team- her husband was insistent on this.  Will continue to follow.  Lane Hacker, DO Palliative Medicine

## 2013-06-30 ENCOUNTER — Telehealth: Payer: Self-pay | Admitting: Internal Medicine

## 2013-06-30 DIAGNOSIS — R110 Nausea: Secondary | ICD-10-CM

## 2013-06-30 DIAGNOSIS — F339 Major depressive disorder, recurrent, unspecified: Secondary | ICD-10-CM

## 2013-06-30 DIAGNOSIS — J189 Pneumonia, unspecified organism: Secondary | ICD-10-CM

## 2013-06-30 DIAGNOSIS — S42309D Unspecified fracture of shaft of humerus, unspecified arm, subsequent encounter for fracture with routine healing: Secondary | ICD-10-CM

## 2013-06-30 DIAGNOSIS — I10 Essential (primary) hypertension: Secondary | ICD-10-CM

## 2013-06-30 DIAGNOSIS — G8929 Other chronic pain: Secondary | ICD-10-CM

## 2013-06-30 DIAGNOSIS — E785 Hyperlipidemia, unspecified: Secondary | ICD-10-CM

## 2013-06-30 LAB — GLUCOSE, CAPILLARY
GLUCOSE-CAPILLARY: 78 mg/dL (ref 70–99)
GLUCOSE-CAPILLARY: 120 mg/dL — AB (ref 70–99)
GLUCOSE-CAPILLARY: 90 mg/dL (ref 70–99)

## 2013-06-30 MED ORDER — BOOST / RESOURCE BREEZE PO LIQD: 1.0000 | Freq: Three times a day (TID) | ORAL | Status: DC

## 2013-06-30 MED ORDER — PREDNISONE 20 MG PO TABS: 20.0000 mg | ORAL_TABLET | Freq: Every day | ORAL | Status: DC

## 2013-06-30 MED ORDER — LISINOPRIL 2.5 MG PO TABS: 2.5000 mg | ORAL_TABLET | Freq: Every day | ORAL | Status: DC

## 2013-06-30 MED ORDER — ENSURE COMPLETE PO LIQD: 237.0000 mL | Freq: Two times a day (BID) | ORAL | Status: DC

## 2013-06-30 MED ORDER — HEPARIN SOD (PORK) LOCK FLUSH 100 UNIT/ML IV SOLN
500.0000 [IU] | INTRAVENOUS | Status: DC
  Filled 2013-06-30: qty 5

## 2013-06-30 MED ORDER — CARVEDILOL 6.25 MG PO TABS
6.2500 mg | ORAL_TABLET | Freq: Two times a day (BID) | ORAL | Status: DC
Start: 2013-06-30 — End: 2014-03-14

## 2013-06-30 MED ORDER — HEPARIN SOD (PORK) LOCK FLUSH 100 UNIT/ML IV SOLN
500.0000 [IU] | INTRAVENOUS | Status: DC | PRN
  Administered 2013-06-30: 500 [IU]
  Filled 2013-06-30: qty 5

## 2013-06-30 NOTE — Progress Notes (Signed)
OT Cancellation Note  Patient Details Name: Maria Lucero MRN: [id number] DOB: [birth year]   Cancelled Treatment:    Reason Eval/Treat Not Completed: Other (comment). Per PT, Spoke with RN and hospice representative. Pt planned to be D/C today with hospice and family support.      Emmit Alexanders [facility] 06/30/2013, 1:48 PM

## 2013-06-30 NOTE — Telephone Encounter (Signed)
I can be the hospice attending, unless she wants Dr Kenton Kingfisher (PCP) to do it. Yes the Hospice physician can assist with symptom management.

## 2013-06-30 NOTE — Progress Notes (Signed)
     Subjective:  Patient reports pain as mild.  Difficulty with transfers.  Shoulder doing ok.  Objective:   VITALS:   Filed Vitals:   06/29/13 1537 06/29/13 2126 06/30/13 0554 06/30/13 0835  BP: 129/54 144/69 150/71 137/72  Pulse: 81 86 73 88  Temp: 97.8 F (36.6 C) 98 F (36.7 C) 97.9 F (36.6 C) 97.3 F (36.3 C)  TempSrc:  Oral Oral   Resp: 17 18 18 18   Height:      Weight:  72.6 kg (160 lb 0.9 oz)    SpO2: 94% 96% 100% 94%    Neurologically intact Sensation intact distally Dorsiflexion/Plantar flexion intact Incision: no drainage and dressing changed. No cellulitis present   Lab Results  Component Value Date   WBC 13.8* 06/28/2013   HGB 9.4* 06/28/2013   HCT 30.1* 06/28/2013   MCV 100.3* 06/28/2013   PLT 345 06/28/2013     Assessment/Plan:     Active Problems:   ANXIETY   Acute exacerbation of chronic obstructive pulmonary disease (COPD)   History of shoulder surgery   Chronic pain   Chronic nausea   Acute respiratory failure with hypercapnia   RLL pneumonia   HTN (hypertension)   Advance diet Up with therapy Left proximal humerus fracture looking good on xr, wounds healing well.   rtc with me in 4 weeks.   Lanyah Spengler P 06/30/2013, 12:53 PM   Marchia Bond, MD Cell [phone]

## 2013-06-30 NOTE — Progress Notes (Signed)
SLP Cancellation Note  Patient Details Name: BIBIANA GILLEAN MRN: [id number] DOB: 09/30/1950   Cancelled treatment:       Reason Eval/Treat Not Completed: Other (comment); pt is scheduled to discharge home today with hospice and family support.   Germain Osgood, M.A. CCC-SLP [phone]  Germain Osgood 06/30/2013, 5:06 PM

## 2013-06-30 NOTE — Clinical Social Work Note (Signed)
Patient discharged home today and CSW facilitated transport via ambulance.  Rosanne Wohlfarth Givens, MSW, LCSW [phone]

## 2013-06-30 NOTE — Progress Notes (Signed)
   CARE MANAGEMENT NOTE 06/30/2013  Patient:  Maria Lucero, Maria Lucero   Account Number:  [id number]  Date Initiated:  06/20/2013  Documentation initiated by:  [facility]  Subjective/Objective Assessment:   Admitted with SOB - progressed to intubation.     Action/Plan:   Home with hospice   Anticipated DC Date:  06/30/2013   Anticipated DC Plan:  [place]  CM consult      Choice offered to / List presented to:  C-3 Spouse        HH arranged  HH-1 RN  [place] agency  [facility] [place]   Status of service:  Completed, signed off Medicare Important Message given?   (If response is "NO", the following Medicare IM given date fields will be blank) Date Medicare IM given:   Date Additional Medicare IM given:    Discharge Disposition:  [place]  Per UR Regulation:  Reviewed for med. necessity/level of care/duration of stay  If discussed at [place] of Stay Meetings, dates discussed:    Comments:  contact: spouse Juanda Crumble  cell:  [phone]  06/30/2012  [place], [place] of [place] requests Package D: hospital bed, APP mattress, overbed table, BSC. The patient has O2 at home with [place], will need humidifier for O2.  [facility] called with the referral as above.   06/29/1514:30  CM received call from Dr. Hilma Favors requesting CM call Mr. Hascall and reassure him concerning DME for his wife's discharge.  Mr Logie states he will carry his cell [phone] in case anyone needs to contact concerning the arrangement of  DME prior to his wife's discharge.  Dr. Hilma Favors states Mr. Cremeans has called "numerous" times and she has reiterated the same as CM that Hospice provider is closed today but will be open tomorrow and arrangements will be made for bed and other requested equipment.  Mr. Inga thanks CM.  Dr. Hilma Favors is  still receiving calls from Mr. Kaman.  This is a tremendous undertaking for this gentleman and he is dealing with it as best he can.  He is always very pleasant but requires gentle reassurance. Mariane Masters, BSN, CM [phone].   [place], [place] [phone]                 Cleon Gustin Daughter     [phone]  06/28/13 [place] RN MSN BSN CCM Received referral to anticipate d/c with hospice services. Talked with pt, spouse, and dtr who all state they want pt to  discharge home with hospice services.  Provided list of hospice agencies, referral made to [facility] [place] per choice.

## 2013-06-30 NOTE — Telephone Encounter (Signed)
Called, spoke with [facility] with Hospice.  Informed her of below per CY.  She verbalized understanding of this.  States they will put CY as attending given he is her pulmonary dr and hospice dx is copd.  If this gets changed, they will call back.  Will sign off and route to CY as FYI.

## 2013-06-30 NOTE — Discharge Summary (Signed)
Physician Discharge Summary  Maria Lucero KYH:[id number] DOB: [birth year] DOA: 06/19/2013  PCP: Maria Frees, MD  Admit date: 06/19/2013 Discharge date: 06/30/2013  Time spent: 35 minutes  Recommendations for Outpatient Follow-up:  Patient will be discharged home with hospice care. She should continue following up with her primary care physician as well as Dr. Baird Lucero. Patient to continue her medications as prescribed.  Discharge Diagnoses:  Active Problems:   ANXIETY   Acute exacerbation of chronic obstructive pulmonary disease (COPD)   History of shoulder surgery   Chronic pain   Chronic nausea   Acute respiratory failure with hypercapnia   RLL pneumonia   HTN (hypertension)   Discharge Condition: Stable  Diet recommendation: Dysphagia 3 diet  Filed Weights   06/26/13 1839 06/28/13 2011 06/29/13 2126  Weight: 72 kg (158 lb 11.7 oz) 69.6 kg (153 lb 7 oz) 72.6 kg (160 lb 0.9 oz)    History of present illness:  63 y.o ex smoker s/p ORIF lt shoulder on 12/19 Maria Lucero)  BIBEMS for sudden onset sob, failed bipap & intubated in ED for hypercarbic resp failure & RLL pneumonia.  PMH - bipolar, severe COPD with h/o Unresectable stage IIB lung cancer s/p chemo-RT in 2011 & Curative radiotherapy to a right lower lobe nodule, neg PET 12/2012   Hospital Course:  This is a 63 year old ex-smoker status post ORIF to the left shoulder by Dr. Mardelle Lucero on 06/16/2014, who presents emergency department with sudden onset of shortness of breath. Patient failed BiPAP and was intubated in the emergency department for hypercarbic respiratory failure and found to have right lower lobe pneumonia. She does have a history of bipolar disorder, severe COPD, unresectable stage IIB lung cancer status post chemotherapy as well as radiation therapy in 2011. CT angiogram was completed showing an increase in size of the previously noted nodules. Bronchoscopy was also completed to reveal mucus plugging in the  left mainstem bronchus as well as the left tracheobronchomalacia.  Patient was nebulized and was able to be extubated on 06/23/2013. Pulmonology continue to follow and did site her condition was very poor and this should be a poor candidate for a repeat mechanical ventilation. Palliative medicine was then consulted on 06/26/2013. Patient was changed to a limited code with no CPR however the patient and family still wished to utilize a ventilator or BiPAP for respiratory distress. Patient was sent out of the ICU to step down unit. He was continued on nasal cannula and seemed to remain stable. Patient was continued on supportive care as well as antibiotics. As far as her anxiety and bipolar disorder patient was continued on her home medications. Patient did have hypertension during her stay which is likely secondary to pulmonary hypertension. She was started on Coreg as well as lisinopril. Patient also had chronic nausea, she was continued on her Klonopin as well as Zofran. Patient will resume her 10 mg once discharged. Patient does have a history of shoulder surgery with chronic pain, Dr. Mardelle Lucero was notified. Patient does have a morphine allergy, was continued on her Ultram. Patient will be discharged today to home with hospice care. She will be followed by her primary care physician as well as Dr. Annamaria Lucero. She should continue followup with Dr. Mardelle Lucero. Patient should continue her current medications as prescribed.  Procedures: 12/23 TTE >>> Limited study, EF 45%, anterior / anteroseptal WMA, mildly dilated RV with possible hypokinesis, PAP 55 torr  12/24 Bronchoscopy >>> mucus plugs L mainstem, severe tracheobronchomalacia   Consultations: PCCM  Palliative medicine  Discharge Exam: Filed Vitals:   06/30/13 0835  BP: 137/72  Pulse: 88  Temp: 97.3 F (36.3 C)  Resp: 18     General: Well developed, malourished  HEENT: NCAT, PERRLA, EOMI, Anicteic Sclera,  Neck: Supple, no JVD, no  masses  Cardiovascular: S1 S2 auscultated, Regular rate and rhythm.  Respiratory: Clear to auscultation bilaterally with equal chest rise, some exp wheezing noted  Abdomen: Soft, nontender, nondistended, + bowel sounds  Extremities: warm dry without cyanosis clubbing.  LUE in sling.   Neuro: AAOx3  Discharge Instructions  Discharge Orders   Future Appointments Provider Department Dept Phone   07/17/2013 12:00 PM [facility] [place] [phone]   07/17/2013 1:00 PM [facility] [phone]   Liquids only 4 hours prior to your exam. Any medications can be taken as usual. Please arrive 15 min prior to your scheduled exam time.   07/17/2013 2:00 PM Deneise Lever, MD [facility] [phone]   07/19/2013 12:00 PM Maria Bears, MD [facility] [phone]   Future Orders Complete By Expires   Discharge instructions  As directed    Comments:     Patient will be discharged home with hospice care. She should continue following up with her primary care physician as well as Dr. Baird Lucero. Patient to continue her medications as prescribed.   DME Hospital bed  As directed    Questions:     Patient has (list medical condition):  ES COPD on oxygen- chronic deconditioning   The above medical condition requires body part(s) to be positioned in ways not feasible with a normal bed: (list body part(s)):  left arm, legs , HOB   Head must be elevated at least:  30 degrees   Bed type:  Semi-electric   Home Health  As directed    Scheduling Instructions:     Anticipate services thru Hospice   Questions:     To provide the following care/treatments:  PT       Medication List    STOP taking these medications       buPROPion 100 MG tablet  Commonly known as:  WELLBUTRIN     DULoxetine 60 MG capsule  Commonly known as:  CYMBALTA     PRESCRIPTION MEDICATION      QUEtiapine 25 MG tablet  Commonly known as:  SEROQUEL     SUMAtriptan 100 MG tablet  Commonly known as:  IMITREX     tiZANidine 2 MG tablet  Commonly known as:  ZANAFLEX      TAKE these medications       albuterol (2.5 MG/3ML) 0.083% nebulizer solution  Commonly known as:  PROVENTIL  Take 2.5 mg by nebulization every 6 (six) hours as needed for wheezing.     carvedilol 6.25 MG tablet  Commonly known as:  COREG  Take 1 tablet (6.25 mg total) by mouth 2 (two) times daily with a meal.     clonazePAM 1 MG tablet  Commonly known as:  KLONOPIN  Take 1-2 mg by mouth 3 (three) times daily.     divalproex 250 MG DR tablet  Commonly known as:  DEPAKOTE  Take 750 mg by mouth at bedtime.     feeding supplement (ENSURE COMPLETE) Liqd  Take 237 mLs by mouth 2 (two) times daily between meals.     feeding supplement (RESOURCE BREEZE) Liqd  Take 1 Container by mouth  3 (three) times daily between meals.     folic acid 1 MG tablet  Commonly known as:  FOLVITE  Take 1 mg by mouth daily.     hydroxychloroquine 200 MG tablet  Commonly known as:  PLAQUENIL  Take 400 mg by mouth daily.     lisinopril 2.5 MG tablet  Commonly known as:  PRINIVIL,ZESTRIL  Take 1 tablet (2.5 mg total) by mouth daily.     omeprazole 20 MG capsule  Commonly known as:  PRILOSEC  Take 20 mg by mouth daily.     oxyCODONE-acetaminophen 10-325 MG per tablet  Commonly known as:  PERCOCET  Take 1-2 tablets by mouth every 6 (six) hours as needed for pain.     predniSONE 20 MG tablet  Commonly known as:  DELTASONE  Take 1 tablet (20 mg total) by mouth daily with breakfast.     promethazine 25 MG tablet  Commonly known as:  PHENERGAN  Take 1 tablet (25 mg total) by mouth every 6 (six) hours as needed for nausea or vomiting.     simvastatin 40 MG tablet  Commonly known as:  ZOCOR  Take 40 mg by mouth daily.     traMADol 50 MG tablet  Commonly known as:  ULTRAM  Take 50 mg by mouth every 6 (six) hours as  needed for pain.     triamcinolone cream 0.1 %  Commonly known as:  KENALOG  Apply 1 application topically 2 (two) times daily as needed (for itching).       Allergies  Allergen Reactions  . Methocarbamol Nausea And Vomiting  . Morphine Sulfate Er Beads Other (See Comments)    confusion  . Aspirin Nausea And Vomiting  . Clarithromycin Other (See Comments)    Stomach problems   . Codeine Nausea And Vomiting  . Lyrica [Pregabalin] Other (See Comments)    Weight gain   . Phenergan [Promethazine Hcl]     Extrapyramidal reaction  . Levaquin [Levofloxacin] Rash       Follow-up Information   Schedule an appointment as soon as possible for a visit with Maria Frees, MD. (As needed)    Specialty:  Family Medicine   Contact information:   [address] [place] [address] [phone]       Follow up with Deneise Lever, MD. Schedule an appointment as soon as possible for a visit in 1 week.   Specialty:  Pulmonary Disease   Contact information:   [address]  [facility] [place] [address] [phone]        The results of significant diagnostics from this hospitalization (including imaging, microbiology, ancillary and laboratory) are listed below for reference.    Significant Diagnostic Studies:  Chest 2 View  06/15/2013   CLINICAL DATA:  Left shoulder fracture preoperative evaluation  EXAM: CHEST  2 VIEW  COMPARISON:  03/08/2013  FINDINGS: Cardiac shadow is stable. A right chest wall port is again seen in satisfactory position. The right lung is clear with the exception of some changes of scarring in the right lung base stable from multiple previous exams. There is masslike density noted along the lateral aspect of left hemi thorax. It is stable from multiple previous exams and consistent with post radiation therapy. A fracture of the proximal left humerus is seen. No other bony abnormality is noted. Fixation hardware is noted  in the lower thoracic and upper lumbar spines.  IMPRESSION: Diffuse chronic changes bilaterally. No acute intrathoracic  abnormality is noted.  Left humeral fracture   Electronically Signed   By: Inez Catalina M.D.   On: 06/15/2013 16:12   Ct Angio Chest Pe W/cm &/or Wo Cm  06/20/2013   CLINICAL DATA:  Hypoxemia; hypotensive.  EXAM: CT ANGIOGRAPHY CHEST WITH CONTRAST  TECHNIQUE: Multidetector CT imaging of the chest was performed using the standard protocol during bolus administration of intravenous contrast. Multiplanar CT image reconstructions including MIPs were obtained to evaluate the vascular anatomy.  CONTRAST:  119mL OMNIPAQUE IOHEXOL 350 MG/ML SOLN  COMPARISON:  CT of the chest performed 12/26/2012, and chest radiograph performed 06/19/2013  FINDINGS: There is no evidence of pulmonary embolus. Evaluation for pulmonary embolus is suboptimal in areas of airspace opacification.  A prominent spiculated hazy opacity at the right lower lobe is slightly less well defined than on the prior study, with somewhat increased architectural distortion. As before, this raises concern for bronchogenic carcinoma. There is now a trace right pleural effusion. The previously noted nodule along the right major fissure now measures 0.8 cm, increased in size from the prior study, and a new 0.9 cm nodule is seen more inferiorly along the right major fissure. Dense consolidation and volume loss at the left upper lobe is grossly unchanged in appearance. Underlying emphysematous change is again noted bilaterally. There is no evidence of pneumothorax.  A small pericardial effusion is noted, similar in appearance to the prior study. The mediastinum is otherwise unremarkable in appearance. No mediastinal lymphadenopathy is seen. The great vessels are grossly unremarkable in appearance. No axillary lymphadenopathy is seen. The thyroid gland is unremarkable in appearance.  The visualized portions of the liver and spleen are unremarkable.   No acute osseous abnormalities are seen. Postoperative change is noted along the proximal left humerus. A right-sided chest port is noted.  Review of the MIP images confirms the above findings.  IMPRESSION: 1. No evidence for pulmonary embolus. 2. Prominent spiculated hazy opacity at the right lower lung lobe is slightly less well defined, with somewhat increased architectural distortion. As before, this is suspicious for bronchogenic carcinoma. There is now a trace right pleural effusion. Nodules along the right major fissure have also increased in size, or are new from the prior study, raising concern for satellite nodules. 3. Stable chronic dense consolidation involving most of the left upper lung lobe. 4. Bilateral emphysematous change again seen. 5. Stable small pericardial effusion noted.   Electronically Signed   By: Garald Balding M.D.   On: 06/20/2013 01:35   Ct Shoulder Left Wo Contrast  06/11/2013   CLINICAL DATA:  Fall. Left arm pain. Radiata extremity straight it proximal left femur fracture.  EXAM: CT OF THE LEFT SHOULDER WITHOUT CONTRAST  TECHNIQUE: Multidetector CT imaging was performed according to the standard protocol. Multiplanar CT image reconstructions were also generated.  COMPARISON:  06/11/2013.  FINDINGS: The anatomic neck and humeral head remain intact. The humeral head remains located. Scapula is within normal limits. There is a comminuted proximal humeral metaphysis fracture. The fracture shows 1 shaft width anterior displacement relative to the metaphysis. Comminuted fracture fragments are present. Stranding is present in the anterior subcutaneous fat and there is intramuscular hematoma in the anterior deltoid. Fracture extends into the lower aspect of the bicipital groove. Nondisplaced fracture extends into the surgical neck of the humerus. There is no rotator cuff muscular atrophy. No significant AC joint osteoarthritis.  The lungs demonstrate area chronic consolidation in the  left mid lung, unchanged from CT 12/26/2012. Old corticated  rib fractures are present in the left lateral chest. Partially visualized right IJ catheter.  IMPRESSION: 1. Comminuted proximal left humeral metaphysis fracture with 1 shaft width anterior displacement. Nondisplaced fractures extend into the surgical neck of the humerus. 2. Chronic changes in the lungs with old left-sided rib fractures. 3. Hematoma in the anterior deltoid.   Electronically Signed   By: Dereck Ligas M.D.   On: 06/11/2013 16:19   Dg Chest Port 1 View  06/23/2013   CLINICAL DATA:  63 year old female intubated. Acute exacerbation of COPD. Initial encounter.  EXAM: PORTABLE CHEST - 1 VIEW  COMPARISON:  06/22/2013 and earlier.  FINDINGS: Portable AP semi upright view at 0459 hrs. Stable endotracheal tube, tip at the level the clavicles. Stable right chest porta cath. Stable visible enteric tube coursing to the left upper quadrant.  The patient remains rotated to the left. Decreased veiling opacity at the right lung base. Continued subtotal opacification of the left lung base. Chronic left perihilar consolidation/architectural distortion. Stable cardiac size and mediastinal contours. No pneumothorax or large effusion.  IMPRESSION: 1. Stable lines and tubes.  2. Mildly improved ventilation at the right lung base. Stable left lung with lower lobe hypoventilation superimposed on chronic left perihilar consolidation.   Electronically Signed   By: Lars Pinks M.D.   On: 06/23/2013 07:27   Dg Chest Port 1 View  06/22/2013   CLINICAL DATA:  Respiratory failure  EXAM: PORTABLE CHEST - 1 VIEW  COMPARISON:  06/21/2013  FINDINGS: Portions of the left lung are now aerated, specifically left apex and the mid to lower lung zone. Opacity extends from the left hilum to the left lateral pleural margin. There is hazy opacity at the right lung base likely combination of a small effusion and atelectasis.  No convincing pneumothorax.  Endotracheal tube,  nasogastric tube and right anterior chest wall power Port-A-Cath are stable in well positioned.  IMPRESSION: Interval improvement.  Aeration has improved in the left lung.  Support apparatus is stable in well positioned.   Electronically Signed   By: Lajean Manes M.D.   On: 06/22/2013 07:18   Dg Chest Port 1 View  06/21/2013   CLINICAL DATA:  Check endotracheal tube position  EXAM: PORTABLE CHEST - 1 VIEW  COMPARISON:  06/20/2013  FINDINGS: Cardiac shadow is stable. There has been significant increase in the degree of left-sided pleural effusion and consolidation with midline shift towards the left an endotracheal tube is seen 3.7 cm above the carina. A nasogastric catheter is noted within the stomach. A right-sided chest wall port is again seen. Persistent right basilar changes are noted with increase in right-sided effusion as well.  IMPRESSION: Significant increase in left-sided pleural effusion with near complete opacification of the left hemi thorax. Volume loss and mediastinal shift to the left is noted.  The increase in right-sided pleural effusion when compared with the prior study.  Tubes and lines as described.   Electronically Signed   By: Inez Catalina M.D.   On: 06/21/2013 07:18   Dg Chest Port 1 View  06/20/2013   CLINICAL DATA:  Orogastric tube placement.  Intubated patient.  EXAM: PORTABLE CHEST - 1 VIEW  COMPARISON:  06/20/2013 at 2:18 a.m.  FINDINGS: Orogastric tube passes below the diaphragm and below the included field of view.  Endotracheal tube and right internal jugular power Port-A-Cath are stable in well positioned.  Stable bilateral airspace opacifications. No new lung abnormalities. No pneumothorax.  IMPRESSION: Orogastric tube passes below the diaphragm  into the stomach but is not fully imaged on this study.  No other change from the earlier exam.   Electronically Signed   By: Lajean Manes M.D.   On: 06/20/2013 10:04   Dg Chest Port 1 View  06/20/2013   CLINICAL DATA:   Evaluate endotracheal tube  EXAM: PORTABLE CHEST - 1 VIEW  COMPARISON:  Prior CT from the same day as well as earlier chest radiographs.  FINDINGS: Axis right Port-A-Cath is stable in position. Tip of the endotracheal tube is positioned 5.0 cm above the carina. Cardiac silhouette is unchanged.  New dense opacity within the left upper lobe with associated architectural distortion and volume loss is Stable the prior. Hazy right lower lobe opacities again noted, unchanged. Emphysematous changes noted. The left lower lobe appears largely collapsed. No pneumothorax.  Fixation hardware seen within the thoracolumbar spine, partially visualized. Sequelae of prior ORIF again noted within the proximal left humerus. No acute osseous abnormality.  IMPRESSION: 1. Tip of the endotracheal tube 5.0 cm above the carina. 2. Otherwise stable appearance of the chest with chronic changes of the left hemi thorax and diffuse hazy opacity within the right lung base.   Electronically Signed   By: Jeannine Boga M.D.   On: 06/20/2013 02:46   Dg Chest Port 1 View  06/19/2013   CLINICAL DATA:  Short of breath.  EXAM: PORTABLE CHEST - 1 VIEW  COMPARISON:  06/15/2013.  FINDINGS: Chronic changes of the left hemithorax are present. There is new airspace disease at the right lung base which may represent aspiration, asymmetric pulmonary edema or pneumonia. Cardiopericardial silhouette appears similar. Power port from right IJ approach again noted, unchanged.  IMPRESSION: New right basilar airspace disease favored to represent aspiration or pneumonia. Asymmetric/atypical pulmonary edema is in the differential considerations.   Electronically Signed   By: Dereck Ligas M.D.   On: 06/19/2013 23:40   Dg Shoulder Left  06/28/2013   CLINICAL DATA:  Left shoulder pain, postoperative evaluation  EXAM: LEFT SHOULDER - 2+ VIEW  COMPARISON:  06/16/2013  FINDINGS: Postsurgical changes are noted in the proximal left humerus. A fixation sideplate  and multiple fixation screws are again noted. The overall appearance of the fracture is stable.  IMPRESSION: No change from the prior exam.   Electronically Signed   By: Inez Catalina M.D.   On: 06/28/2013 16:13   Dg Shoulder Left  06/16/2013   CLINICAL DATA:  Left shoulder surgery.  EXAM: LEFT SHOULDER - 2+ VIEW  COMPARISON:  06/11/2013  FINDINGS: Two portable images the of the left shoulder demonstrate changes of plate and screw fixation across the comminuted left humeral neck fracture. Slight displacement of a lateral fragment, otherwise near anatomic alignment. No subluxation or dislocation.  Chronic airspace opacity noted in the left upper lobe, stable since prior chest x-ray.  IMPRESSION: Internal fixation of the left humeral neck fracture as above.   Electronically Signed   By: Rolm Baptise M.D.   On: 06/16/2013 19:41   Dg Shoulder Left  06/16/2013   CLINICAL DATA:  ORIF for left proximal humeral fracture.  EXAM: LEFT SHOULDER - 2+ VIEW  COMPARISON:  CT scan of the left shoulder dated 11 June 2013.  FINDINGS: The patient has undergone ORIF for a fracture through the neck of the left humerus. The previously significantly displaced fracture has been reduced and alignment is now more nearly anatomic.  IMPRESSION: The patient has undergone ORIF for proximal left humeral fracture. Further interpretation is deferred  to Dr. Mardelle Lucero   Electronically Signed   By: David  Martinique   On: 06/16/2013 15:33   Dg Shoulder Left  06/11/2013   CLINICAL DATA:  Fall. Left shoulder pain. Obvious fracture deformity.  EXAM: LEFT SHOULDER - 2+ VIEW  COMPARISON:  None.  FINDINGS: Left humeral neck fracture is seen, with medial displacement of the humeral shaft by greater than 1 shaft's width. No evidence of dislocation of humeral head.  IMPRESSION: Left femoral neck fracture, with significant medial displacement of humeral shaft.   Electronically Signed   By: Earle Gell M.D.   On: 06/11/2013 14:09   Dg Abd Portable  1v  06/20/2013   CLINICAL DATA:  Orogastric tube placement  EXAM: PORTABLE ABDOMEN - 1 VIEW  COMPARISON:  None.  FINDINGS: Orogastric tube passing below the diaphragm into the mid stomach.  Normal bowel gas pattern.  Changes from a along posterior fusion extending from the lower thoracic spine to S1. Orthopedic hardware is well-seated and aligned.  IMPRESSION: 1. Orogastric tube tip lies in the mid stomach, well positioned. 2. No acute findings within the abdomen.   Electronically Signed   By: Lajean Manes M.D.   On: 06/20/2013 10:05   Dg Swallowing Func-speech Pathology  06/28/2013   Germain Osgood, CCC-SLP     06/28/2013  2:29 PM Objective Swallowing Evaluation: Modified Barium Swallowing Study   Patient Details  Name: Maria Lucero MRN: [id number] Date of Birth: [birth year]  Today's Date: 06/28/2013 Time: 1321-1340 SLP Time Calculation (min): 19 min  Past Medical History:  Past Medical History  Diagnosis Date  . Migraine headache   . Anxiety   . Urticaria   . Low back pain syndrome   . Hyperlipidemia   . Hypercholesterolemia   . Depression   . Tobacco abuse   . Myalgia and myositis, unspecified   . Bipolar 1 disorder   . Allergy   . Asthma     per pt stated  . Blood transfusion     several x  . Cataract     recent surgery b/l  . Arthritis   . History of radiation therapy 04/11/12; 04/13/12; 04/15/12;  04/18/12; & 04/20/12    Rll lung 60Gy/5/fx  . GERD (gastroesophageal reflux disease)   . COPD (chronic obstructive pulmonary disease)     home o2 at 2l/min  . Shortness of breath     home o2 at 2l/min  . lung ca dx'd 11/2009    chemo comp 05/2010,home o2 at 2l/min  . Lung cancer     invasive left upper lobe adenocarcinoma  . Complication of anesthesia     low oxygen  . PONV (postoperative nausea and vomiting)   . Fracture of humerus, proximal, left, closed 06/16/2013   Past Surgical History:  Past Surgical History  Procedure Laterality Date  . Back surgery      x 4 L3-S1 1996,2000, 2002  . Vesicovaginal  fistula closure w/ tah    . Appendectomy    . Bronchoscopy  2011  . Lung biopsy  2011    needle  . Tonsillectomy    . Abdominal hysterectomy      with b/l oophoorectomy age 14  . Lumbar fusion    . Orif humerus fracture Left 06/16/2013    Procedure: OPEN REDUCTION INTERNAL FIXATION (ORIF) LEFT  SHOULDER;  Surgeon: Johnny Bridge, MD;  Location: [facility];   Service: Orthopedics;  Laterality: Left;   HPI:  63 y.o ex smoker s/p ORIF lt  shoulder on 12/19, admitted for  sudden onset sob, failed bipap & intubated (12/23-12/26) in ED  for hypercarbic resp failure & RLL pneumonia.  PMH - bipolar,  severe COPD on 2L [place] ([place]) with h/o Unresectable stage IIB lung  cancer s/p chemo-RT in 2011 & Curative radiotherapy to a right  lower lobe nodule, neg PET 12/2012, GERD, prior admissions for  resp failure attributed to narcotics & muscle relaxants,  Difficult to control bipolar per family- will not go to psych.   CXR Mildly improved ventilation at the right lung base. Stable  left lung with lower lobe hypoventilation superimposed on chronic  left perihilar consolidation.     Assessment / Plan / Recommendation Clinical Impression  Dysphagia Diagnosis: Mild pharyngeal phase dysphagia Clinical impression: Pt presents with a mild sensory-based  pharyngeal dysphagia that appears to fluctuate with pt's  respiratory status. Pharyngeal phase is marked by mild-moderately  delayed swallow initiation at the level of the pyriform sinuses  with all consistencies tested. Delayed initiation resulted in  aspiration before the swallow of thin liquid in mixed consistency  bolus, with cough resulting in what appeared to be increased SOB.  With increased SOB pt demonstrated small amount of silent  aspiration with straw sips of thin liquids; however, when pt's  breathing returned to baseline, she was able to consume small cup  sips of thin liquid with only flash penetration observed. SLP  attempted chin tuck with thin liquids and cup sips of nectar   thick liquids, both of which appeared effective at reducing  aspiration risk as well, and may be considered other options to  pursue in the future. At this time, recommend to continue Dys 3  textures and thin liquids with no straws and extra pauses to  allow for breathing. Skilled intervention included thorough  education regarding results, recommendations, and aspiration  risks.    Treatment Recommendation  Therapy as outlined in treatment plan below    Diet Recommendation Dysphagia 3 (Mechanical Soft);Thin liquid   Liquid Administration via: Cup;No straw Medication Administration: Whole meds with puree Supervision: Patient able to self feed;Full supervision/cueing  for compensatory strategies Compensations: Slow rate;Small sips/bites Postural Changes and/or Swallow Maneuvers: Seated upright 90  degrees    Other  Recommendations Oral Care Recommendations: Oral care BID   Follow Up Recommendations  None    Frequency and Duration min 1 x/week  1 week   Pertinent Vitals/Pain N/A    SLP Swallow Goals     General Date of Onset:  (chronic) HPI: 63 y.o ex smoker s/p ORIF lt shoulder on 12/19, admitted for  sudden onset sob, failed bipap & intubated (12/23-12/26) in ED  for hypercarbic resp failure & RLL pneumonia.  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   CXR Mildly improved ventilation at the right lung base. Stable  left lung with lower lobe hypoventilation superimposed on chronic  left perihilar consolidation. Type of Study: Modified Barium Swallowing Study Reason for Referral: Objectively evaluate swallowing function Previous Swallow Assessment: clinical swallow evaluation 12/29  recommendations for Dys 3/thin liquids Diet Prior to  this Study: Dysphagia 3 (soft);Thin liquids Temperature Spikes Noted: No Respiratory Status: Nasal cannula (2L) History of Recent  Intubation: Yes Date extubated: 06/23/13 Behavior/Cognition: Alert;Pleasant mood;Cooperative Oral Cavity - Dentition: Dentures, bottom;Dentures, not available  (top dentures still not in hospital) Self-Feeding Abilities: Able to feed self Patient Positioning: Upright in chair Baseline Vocal Quality: Clear Volitional Cough: Strong Volitional Swallow: Able to elicit Anatomy: Other (Comment) (suspect osteophytes C4-5; MD not  present to confirm) Pharyngeal Secretions: Not observed secondary MBS    Reason for Referral Objectively evaluate swallowing function   Oral Phase Oral Preparation/Oral Phase Oral Phase: WFL   Pharyngeal Phase Pharyngeal Phase Pharyngeal Phase: Impaired Pharyngeal - Nectar Pharyngeal - Nectar Cup: Delayed swallow initiation Pharyngeal - Thin Pharyngeal - Thin Cup: Delayed swallow initiation;Reduced  airway/laryngeal closure;Penetration/Aspiration during swallow Penetration/Aspiration details (thin cup): Material enters  airway, remains ABOVE vocal cords then ejected out Pharyngeal - Thin Straw: Delayed swallow initiation;Reduced  airway/laryngeal closure;Penetration/Aspiration before swallow Penetration/Aspiration details (thin straw): Material enters  airway, passes BELOW cords and not ejected out despite cough  attempt by patient;Material enters airway, passes BELOW cords  without attempt by patient to eject out (silent aspiration) Pharyngeal - Solids Pharyngeal - Puree: Delayed swallow initiation Pharyngeal - Mechanical Soft: Delayed swallow initiation Pharyngeal - Pill: Delayed swallow initiation  Cervical Esophageal Phase    GO    Cervical Esophageal Phase Cervical Esophageal Phase: [facility]         Germain Osgood, M.A. CCC-SLP [phone]  Germain Osgood 06/28/2013, 2:28 PM     Microbiology: Recent Results (from the past 240 hour(s))  CULTURE, RESPIRATORY  (NON-EXPECTORATED)     Status: None   Collection Time    06/21/13 10:14 AM      Result Value Range Status   Specimen Description BRONCHIAL ALVEOLAR LAVAGE   Final   Special Requests NONE   Final   Gram Stain     Final   Value: FEW WBC PRESENT, PREDOMINANTLY PMN     NO SQUAMOUS EPITHELIAL CELLS SEEN     NO ORGANISMS SEEN     Performed at [facility]   Culture     Final   Value: NO GROWTH 2 DAYS     Performed at [facility]   Report Status 06/23/2013 FINAL   Final     Labs: Basic Metabolic Panel:  Recent Labs Lab 06/24/13 1225 06/25/13 0410 06/26/13 0720 06/27/13 0410 06/28/13 0443  NA 141 138 140 140 145  K 3.7 4.1 5.1 4.0 3.8  CL 103 98 100 96 101  CO2 29 27 29  32 38*  GLUCOSE 110* 111* 81 83 81  BUN 29* 38* 36* 26* 18  CREATININE 0.54 0.68 0.76 0.62 0.59  CALCIUM 8.1* 8.9 8.5 8.2* 7.8*   Liver Function Tests: No results found for this basename: AST, ALT, ALKPHOS, BILITOT, PROT, ALBUMIN,  in the last 168 hours No results found for this basename: LIPASE, AMYLASE,  in the last 168 hours No results found for this basename: AMMONIA,  in the last 168 hours CBC:  Recent Labs Lab 06/24/13 1225 06/25/13 0410 06/26/13 0720 06/28/13 0443  WBC 16.4* 20.5* 17.1* 13.8*  HGB 8.5* 10.2* 10.7* 9.4*  HCT 28.1* 33.6* 34.0* 30.1*  MCV 100.0 100.3* 99.1 100.3*  PLT 349 418* 360 345   Cardiac Enzymes:  Recent Labs Lab 06/25/13 0517  TROPONINI 0.39*   BNP: BNP (last 3 results) No results found for this basename: PROBNP,  in the last 8760 hours CBG:  Recent Labs Lab 06/29/13 1128 06/29/13 1537 06/29/13 2143 06/30/13 0715 06/30/13 1133  GLUCAP 91 102* 137* 78  120*       SignedCristal Ford  [facility] 06/30/2013, 12:44 PM

## 2013-06-30 NOTE — Progress Notes (Signed)
Notified by [facility], patient and family request services of [facility] after discharge. Spoke with patient at bedside -she requested I call her husband who will be at hospital later today- I did speak with Mr Subia to initiate education related to hospice services, philosophy and team approach to care; he voiced good understanding of information provided  Per discussion plan is to d/c today by personal vehicle,per pt she has a w/c and ramp at home Per discussion with patient she wants foley removed; discussed staff RN Clearnce Sorrel and Dr Ree Kida, Foley to be removed and will check for pt ability to urinate prior to d/c  DME: pt currently has w/c, walker and O2 in the home from [facility]; pt/family request  Complete pckg D: fully electric hospital bed with full rails; AP&P mattress, over-bed able and add a 3n1 BSC and humidifier for O2 concentrator *Please contact husband Juanda Crumble at [phone] to arrange delivery time -equipment will need to be int he home prior to d/c  Initial paperwork faxed to [facility]  Completed d/c summary will need to be faxed to [facility] @ [phone] when final Please notify [facility] when patient is ready to leave unit at d/c call [phone] (or [phone] if after 5 pm);  [facility] information and contact numbers will be given to husband when he arrives to hospital room later this am  Above information shared with [facility] Please call with any questions or concerns   Danton Sewer, RN 06/30/2013, 10:47 AM [facility] Palliative Medicine Team RN Liaison [phone]

## 2013-06-30 NOTE — Progress Notes (Signed)
PT Cancellation Note  Patient Details Name: [name] MRN: [id number] DOB: July 11, 1950   Cancelled Treatment:    Reason Eval/Treat Not Completed: Other (comment)  Spoke with RN and hospice representative. Pt planned to be D/C today with hospice and family support.    Elie Confer [place], [place] 06/30/2013, 1:24 PM

## 2013-06-30 NOTE — Telephone Encounter (Signed)
Called Hospice, Spoke with [place].  Pt is currently in the hospital.  They received order from Hospitalist for Hospice referral with COPD as dx . Would like to know if [facility] will be the attending.  If so, would you like hospice MDs to assist with symptom management?

## 2013-06-30 NOTE — Progress Notes (Signed)
Patient discharged home with home hospice.  Patient educated on discharge instructions and follow-up appointments.  Patient educated on new discharge medications.  Prescriptions sent electronically to [facility].  Port deaccessed by IV team and heparin locked.  Belongings gathered.  [facility] called to notify of patient departure.  Patient escorted off unit via ambulance.

## 2013-06-30 NOTE — Progress Notes (Signed)
Follow-up  Notified pt's family at bedside -Spoke outside of room with patient's husband Juanda Crumble [phone]) and daughter Levada Dy [phone]); regarding plans for home with hospice; they voiced concerns about pt's ability to transfer as she has not been getting OOB and raised concern about personally transporting pt home- they a;lso asked if PT would try to see how pt did  With transferring - PT has seen pt this past Tuesday at that time she was a +2 assist as pt cannot bear weight on L shoulder r/t previous shoulder surgery; husband/daughter stating they may feel better for patient's safety with pt being transported by ambulance home - [place] notified of family request for transport; spoke w dtr Levada Dy to request her dad make RN Caryl Pina aware when equipment arrives to the home so plans can be started for transport once all things are in place; Levada Dy voiced understanding; [facility] information and contact numbers given to pt's husband during visit  Danton Sewer, RN 06/30/2013, 1:16 PM [facility] Liaison [phone]

## 2013-07-03 ENCOUNTER — Telehealth: Payer: Self-pay | Admitting: Internal Medicine

## 2013-07-03 MED ORDER — PREDNISONE 10 MG PO TABS: 20.0000 mg | ORAL_TABLET | Freq: Every day | ORAL | Status: DC

## 2013-07-03 NOTE — Telephone Encounter (Signed)
keisha from hospice called back and she stated that the pt is currently taking prednisone 20 mg daily and will need a refill of this.  Will need to put that pt is hospice on this rx when we send this in.  [facility] please advise if ok to refill.  Thanks   Last ov--11/29/2012 Next ov--07/17/2013  Allergies  Allergen Reactions  . Methocarbamol Nausea And Vomiting  . Morphine Sulfate Er Beads Other (See Comments)    confusion  . Aspirin Nausea And Vomiting  . Clarithromycin Other (See Comments)    Stomach problems   . Codeine Nausea And Vomiting  . Lyrica [Pregabalin] Other (See Comments)    Weight gain   . Phenergan [Promethazine Hcl]     Extrapyramidal reaction  . Levaquin [Levofloxacin] Rash     Current Outpatient Prescriptions on File Prior to Visit  Medication Sig Dispense Refill  . albuterol (PROVENTIL) (2.5 MG/3ML) 0.083% nebulizer solution Take 2.5 mg by nebulization every 6 (six) hours as needed for wheezing.      . carvedilol (COREG) 6.25 MG tablet Take 1 tablet (6.25 mg total) by mouth 2 (two) times daily with a meal.  60 tablet  0  . clonazePAM (KLONOPIN) 1 MG tablet Take 1-2 mg by mouth 3 (three) times daily.       . divalproex (DEPAKOTE) 250 MG DR tablet Take 750 mg by mouth at bedtime.      . feeding supplement, ENSURE COMPLETE, (ENSURE COMPLETE) LIQD Take 237 mLs by mouth 2 (two) times daily between meals.      . feeding supplement, RESOURCE BREEZE, (RESOURCE BREEZE) LIQD Take 1 Container by mouth 3 (three) times daily between meals.    0  . folic acid (FOLVITE) 1 MG tablet Take 1 mg by mouth daily.      . hydroxychloroquine (PLAQUENIL) 200 MG tablet Take 400 mg by mouth daily.       Marland Kitchen lisinopril (PRINIVIL,ZESTRIL) 2.5 MG tablet Take 1 tablet (2.5 mg total) by mouth daily.  30 tablet  0  . omeprazole (PRILOSEC) 20 MG capsule Take 20 mg by mouth daily.        Marland Kitchen oxyCODONE-acetaminophen (PERCOCET) 10-325 MG per tablet Take 1-2 tablets by mouth every 6 (six) hours as needed for  pain.      . predniSONE (DELTASONE) 20 MG tablet Take 1 tablet (20 mg total) by mouth daily with breakfast.  10 tablet  0  . promethazine (PHENERGAN) 25 MG tablet Take 1 tablet (25 mg total) by mouth every 6 (six) hours as needed for nausea or vomiting.  30 tablet  0  . simvastatin (ZOCOR) 40 MG tablet Take 40 mg by mouth daily.       . traMADol (ULTRAM) 50 MG tablet Take 50 mg by mouth every 6 (six) hours as needed for pain.      Marland Kitchen triamcinolone cream (KENALOG) 0.1 % Apply 1 application topically 2 (two) times daily as needed (for itching).       . [DISCONTINUED] lisinopril-hydrochlorothiazide (PRINZIDE,ZESTORETIC) 10-12.5 MG per tablet Take 1 tablet by mouth.        No current facility-administered medications on file prior to visit.

## 2013-07-03 NOTE — Telephone Encounter (Signed)
ATC keisha and was on hold x 5 min. RX sent. wcb in the AM

## 2013-07-03 NOTE — Telephone Encounter (Signed)
Called and lmomtcb for [employer].

## 2013-07-03 NOTE — Telephone Encounter (Signed)
Ok prednisone 10 mg, # 60, 2 daily    Ref x 3

## 2013-07-04 NOTE — Telephone Encounter (Signed)
Hospice nurse called back-aware that Rx has been sent. Nothing more needed at this time.

## 2013-07-04 NOTE — Telephone Encounter (Signed)
lmomtcb x1 for [employer]

## 2013-07-05 ENCOUNTER — Telehealth: Payer: Self-pay | Admitting: Internal Medicine

## 2013-07-05 NOTE — Telephone Encounter (Signed)
I spoke with spouse. He reports Dr. Shirline Frees called yesterday and was told pt could not take the prednisone. Spouse is not sure why this is being stopped. Spouse thinks she needs this bc this helps with her breathing. Spouse wants to know if Dr. [name] is okay with pt taking this medication. We did call in refill 07/03/13 for this (okay'd by Dr. [name]). Please advise Dr. [name] thanks

## 2013-07-05 NOTE — Telephone Encounter (Signed)
If Dr Kenton Kingfisher wanted her off the prednisone (we know it has a lot of side effects), then suggest husband call Dr Kenton Kingfisher to discuss, or ask Dr Kenton Kingfisher to contact us. Otherwise we get too many cooks making suggestions.

## 2013-07-05 NOTE — Telephone Encounter (Signed)
ATC line rang several times and no VM WCB

## 2013-07-06 NOTE — Telephone Encounter (Signed)
Pt husband advised. [place] Bing, CMA

## 2013-07-17 ENCOUNTER — Ambulatory Visit (HOSPITAL_COMMUNITY)
Admission: RE | Admit: 2013-07-17 | Discharge: 2013-07-17 | Disposition: A | Discharge status: Home / Self Care | Payer: Medicare HMO | Source: Ambulatory Visit | Attending: Internal Medicine | Admitting: Internal Medicine

## 2013-07-17 ENCOUNTER — Other Ambulatory Visit (HOSPITAL_BASED_OUTPATIENT_CLINIC_OR_DEPARTMENT_OTHER): Payer: Medicare HMO

## 2013-07-17 ENCOUNTER — Telehealth: Payer: Self-pay | Admitting: Internal Medicine

## 2013-07-17 ENCOUNTER — Ambulatory Visit (INDEPENDENT_AMBULATORY_CARE_PROVIDER_SITE_OTHER): Admitting: Internal Medicine

## 2013-07-17 ENCOUNTER — Encounter: Payer: Self-pay | Admitting: Internal Medicine

## 2013-07-17 VITALS — BP 122/86 | HR 85 | Ht 63.0 in | Wt 140.2 lb

## 2013-07-17 DIAGNOSIS — C3490 Malignant neoplasm of unspecified part of unspecified bronchus or lung: Secondary | ICD-10-CM

## 2013-07-17 DIAGNOSIS — J4489 Other specified chronic obstructive pulmonary disease: Secondary | ICD-10-CM | POA: Insufficient documentation

## 2013-07-17 DIAGNOSIS — F339 Major depressive disorder, recurrent, unspecified: Secondary | ICD-10-CM

## 2013-07-17 DIAGNOSIS — R918 Other nonspecific abnormal finding of lung field: Secondary | ICD-10-CM | POA: Insufficient documentation

## 2013-07-17 DIAGNOSIS — C3410 Malignant neoplasm of upper lobe, unspecified bronchus or lung: Secondary | ICD-10-CM

## 2013-07-17 DIAGNOSIS — J449 Chronic obstructive pulmonary disease, unspecified: Secondary | ICD-10-CM | POA: Insufficient documentation

## 2013-07-17 DIAGNOSIS — J441 Chronic obstructive pulmonary disease with (acute) exacerbation: Secondary | ICD-10-CM

## 2013-07-17 LAB — COMPREHENSIVE METABOLIC PANEL (CC13)
ALK PHOS: 96 U/L (ref 40–150)
ALT: 13 U/L (ref 0–55)
AST: 14 U/L (ref 5–34)
Albumin: 3.2 g/dL — ABNORMAL LOW (ref 3.5–5.0)
Anion Gap: 12 mEq/L — ABNORMAL HIGH (ref 3–11)
BUN: 9.3 mg/dL (ref 7.0–26.0)
CALCIUM: 10.2 mg/dL (ref 8.4–10.4)
CHLORIDE: 99 meq/L (ref 98–109)
CO2: 29 mEq/L (ref 22–29)
CREATININE: 0.8 mg/dL (ref 0.6–1.1)
Glucose: 96 mg/dl (ref 70–140)
Potassium: 4.8 mEq/L (ref 3.5–5.1)
Sodium: 141 mEq/L (ref 136–145)
Total Bilirubin: 0.38 mg/dL (ref 0.20–1.20)
Total Protein: 6.9 g/dL (ref 6.4–8.3)

## 2013-07-17 LAB — CBC WITH DIFFERENTIAL/PLATELET
BASO%: 1 % (ref 0.0–2.0)
Basophils Absolute: 0 10*3/uL (ref 0.0–0.1)
EOS%: 5 % (ref 0.0–7.0)
Eosinophils Absolute: 0.3 10*3/uL (ref 0.0–0.5)
HEMATOCRIT: 35.3 % (ref 34.8–46.6)
HGB: 11.4 g/dL — ABNORMAL LOW (ref 11.6–15.9)
LYMPH#: 1.3 10*3/uL (ref 0.9–3.3)
LYMPH%: 26 % (ref 14.0–49.7)
MCH: 31.2 pg (ref 25.1–34.0)
MCHC: 32.2 g/dL (ref 31.5–36.0)
MCV: 96.7 fL (ref 79.5–101.0)
MONO#: 0.7 10*3/uL (ref 0.1–0.9)
MONO%: 14.3 % — ABNORMAL HIGH (ref 0.0–14.0)
NEUT#: 2.7 10*3/uL (ref 1.5–6.5)
NEUT%: 53.7 % (ref 38.4–76.8)
Platelets: 359 10*3/uL (ref 145–400)
RBC: 3.65 10*6/uL — ABNORMAL LOW (ref 3.70–5.45)
RDW: 15.7 % — AB (ref 11.2–14.5)
WBC: 5 10*3/uL (ref 3.9–10.3)

## 2013-07-17 LAB — TECHNOLOGIST REVIEW

## 2013-07-17 MED ORDER — IOHEXOL 300 MG/ML  SOLN
80.0000 mL | Freq: Once | INTRAMUSCULAR | Status: AC | PRN
  Administered 2013-07-17: 80 mL via INTRAVENOUS
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
## 2013-07-17 NOTE — Patient Instructions (Signed)
We can continue present meds and oxygen at 2L/ [facility]

## 2013-07-17 NOTE — Telephone Encounter (Signed)
lmtcb x1 

## 2013-07-17 NOTE — Progress Notes (Signed)
[employer], Rad Consults, approved ct chest 07/17/13 auth #  [id number]

## 2013-07-18 NOTE — Telephone Encounter (Signed)
Varney Biles w/ hospice is returning call & asks to be reached at [phone].  Satira Anis

## 2013-07-18 NOTE — Telephone Encounter (Signed)
lmomtcb x1 for [employer]

## 2013-07-18 NOTE — Telephone Encounter (Signed)
Maria Lucero returned call & advised that the refills were sent on 06/30/13 from a Dr. Ree Kida.  Maria Lucero verbalized understanding & states nothing further needed at this time.

## 2013-07-18 NOTE — Telephone Encounter (Signed)
Refills were sent on 06-30-13 from a DR. Mikhail. Not meds [facility] fills. LMTCBx2 with Varney Biles to advise.[place] Bing, CMA

## 2013-07-19 ENCOUNTER — Encounter (INDEPENDENT_AMBULATORY_CARE_PROVIDER_SITE_OTHER): Payer: Self-pay

## 2013-07-19 ENCOUNTER — Encounter: Payer: Self-pay | Admitting: Internal Medicine

## 2013-07-19 ENCOUNTER — Ambulatory Visit (HOSPITAL_BASED_OUTPATIENT_CLINIC_OR_DEPARTMENT_OTHER): Payer: Medicare HMO | Admitting: Internal Medicine

## 2013-07-19 ENCOUNTER — Telehealth: Payer: Self-pay | Admitting: Internal Medicine

## 2013-07-19 VITALS — BP 93/54 | HR 82 | Temp 98.1°F | Resp 17 | Ht 63.0 in | Wt 139.1 lb

## 2013-07-19 DIAGNOSIS — C3490 Malignant neoplasm of unspecified part of unspecified bronchus or lung: Secondary | ICD-10-CM

## 2013-07-19 DIAGNOSIS — C3430 Malignant neoplasm of lower lobe, unspecified bronchus or lung: Secondary | ICD-10-CM

## 2013-07-19 DIAGNOSIS — R0602 Shortness of breath: Secondary | ICD-10-CM

## 2013-07-19 DIAGNOSIS — J449 Chronic obstructive pulmonary disease, unspecified: Secondary | ICD-10-CM

## 2013-07-19 NOTE — Progress Notes (Signed)
[place] Telephone:([phone]) [phone]   Fax:(336) [phone]  OFFICE PROGRESS NOTE  Shirline Frees, MD [address] [place] [address]  PRINCIPAL DIAGNOSIS: Unresectable stage IIB (T3 N0 M0) non-small cell lung cancer, adenocarcinoma diagnosed in June 2011.   PRIOR THERAPY:  1. Status post concurrent chemoradiation with weekly carboplatin and paclitaxel. Last dose was given February 24, 2010. 2. Consolidation chemotherapy with carboplatin and Alimta, status post 3 cycles. Last dose was given June 09, 2010. 3. Curative radiotherapy to a right lower lobe nodule under the care of Dr. Pablo Ledger completed 04/20/2012.  CURRENT THERAPY: Observation.  INTERVAL HISTORY: Maria Lucero 63 y.o. female returns to the clinic today for followup visit accompanied her husband and her niece. The patient is feeling fine today with no specific complaints except for the baseline shortness of breath and she is currently on home oxygen. She was recently admitted to [facility] with significant dyspnea and chest pain. She had CT angiogram of the chest performed on 06/20/2013 that showed no evidence for pulmonary embolism but there was prominent a spiculated hazy opacity in the right lower lobe slightly less well defined suspicious for bronchogenic carcinoma.  The patient was treated at that time for acute exacerbation of COPD as well as pneumonia in the right lower lobe. She was also referred to hospice service because of her end stage COPD. She denied having any significant chest pain or hemoptysis. The patient denied having any significant weight loss or night sweats. She had repeat CT scan of the chest performed recently and she is here for evaluation and discussion of her scan results.   MEDICAL HISTORY: Past Medical History  Diagnosis Date  . Migraine headache   . Anxiety   . Urticaria   . Low back pain syndrome   . Hyperlipidemia   . Hypercholesterolemia    . Depression   . Tobacco abuse   . Myalgia and myositis, unspecified   . Bipolar 1 disorder   . Allergy   . Asthma     per pt stated  . Blood transfusion     several x  . Cataract     recent surgery b/l  . Arthritis   . History of radiation therapy 04/11/12; 04/13/12; 04/15/12; 04/18/12; & 04/20/12    Rll lung 60Gy/5/fx  . GERD (gastroesophageal reflux disease)   . COPD (chronic obstructive pulmonary disease)     home o2 at 2l/min  . Shortness of breath     home o2 at 2l/min  . lung ca dx'd 11/2009    chemo comp 05/2010,home o2 at 2l/min  . Lung cancer     invasive left upper lobe adenocarcinoma  . Complication of anesthesia     low oxygen  . PONV (postoperative nausea and vomiting)   . Fracture of humerus, proximal, left, closed 06/16/2013    ALLERGIES:  is allergic to methocarbamol; morphine sulfate er beads; aspirin; clarithromycin; codeine; lyrica; phenergan; and levaquin.  MEDICATIONS:  Current Outpatient Prescriptions  Medication Sig Dispense Refill  . albuterol (PROVENTIL) (2.5 MG/3ML) 0.083% nebulizer solution Take 2.5 mg by nebulization every 6 (six) hours as needed for wheezing.      . carvedilol (COREG) 6.25 MG tablet Take 1 tablet (6.25 mg total) by mouth 2 (two) times daily with a meal.  60 tablet  0  . clonazePAM (KLONOPIN) 1 MG tablet Take 1-2 mg by mouth 3 (three) times daily.       Marland Kitchen  divalproex (DEPAKOTE) 250 MG DR tablet Take 750 mg by mouth at bedtime.      . feeding supplement, ENSURE COMPLETE, (ENSURE COMPLETE) LIQD Take 237 mLs by mouth 2 (two) times daily between meals.      . folic acid (FOLVITE) 1 MG tablet Take 1 mg by mouth daily.      . hydroxychloroquine (PLAQUENIL) 200 MG tablet Take 400 mg by mouth daily.       Marland Kitchen lisinopril (PRINIVIL,ZESTRIL) 2.5 MG tablet Take 1 tablet (2.5 mg total) by mouth daily.  30 tablet  0  . omeprazole (PRILOSEC) 20 MG capsule Take 20 mg by mouth daily.        Marland Kitchen oxyCODONE-acetaminophen (PERCOCET) 10-325 MG per tablet  Take 1-2 tablets by mouth every 6 (six) hours as needed for pain.      . promethazine (PHENERGAN) 25 MG tablet Take 1 tablet (25 mg total) by mouth every 6 (six) hours as needed for nausea or vomiting.  30 tablet  0  . simvastatin (ZOCOR) 40 MG tablet Take 40 mg by mouth daily.       . traMADol (ULTRAM) 50 MG tablet Take 50 mg by mouth every 6 (six) hours as needed for pain.      Marland Kitchen triamcinolone cream (KENALOG) 0.1 % Apply 1 application topically 2 (two) times daily as needed (for itching).       . [DISCONTINUED] lisinopril-hydrochlorothiazide (PRINZIDE,ZESTORETIC) 10-12.5 MG per tablet Take 1 tablet by mouth.        No current facility-administered medications for this visit.    SURGICAL HISTORY:  Past Surgical History  Procedure Laterality Date  . Back surgery      x 4 L3-S1 1996,2000, 2002  . Vesicovaginal fistula closure w/ tah    . Appendectomy    . Bronchoscopy  2011  . Lung biopsy  2011    needle  . Tonsillectomy    . Abdominal hysterectomy      with b/l oophoorectomy age 89  . Lumbar fusion    . Orif humerus fracture Left 06/16/2013    Procedure: OPEN REDUCTION INTERNAL FIXATION (ORIF) LEFT SHOULDER;  Surgeon: Johnny Bridge, MD;  Location: [place];  Service: Orthopedics;  Laterality: Left;    REVIEW OF SYSTEMS:  A comprehensive review of systems was negative except for: Constitutional: positive for fatigue Respiratory: positive for dyspnea on exertion   PHYSICAL EXAMINATION: General appearance: alert, cooperative, fatigued and no distress Head: Normocephalic, without obvious abnormality, atraumatic Neck: no adenopathy Lymph nodes: Cervical, supraclavicular, and axillary nodes normal. Resp: clear to auscultation bilaterally Cardio: regular rate and rhythm, S1, S2 normal, no murmur, click, rub or gallop GI: soft, non-tender; bowel sounds normal; no masses,  no organomegaly Extremities: extremities normal, atraumatic, no cyanosis or edema  ECOG PERFORMANCE STATUS: 1 -  Symptomatic but completely ambulatory  Blood pressure 93/54, pulse 82, temperature 98.1 F (36.7 C), temperature source Oral, resp. rate 17, height 5\' 3"  (1.6 m), weight 139 lb 1.6 oz (63.095 kg), SpO2 98.00%.  LABORATORY DATA: Lab Results  Component Value Date   WBC 5.0 07/17/2013   HGB 11.4* 07/17/2013   HCT 35.3 07/17/2013   MCV 96.7 07/17/2013   PLT 359 07/17/2013      Chemistry      Component Value Date/Time   NA 141 07/17/2013 1216   NA 145 06/28/2013 0443   NA 145 08/24/2011 1430   K 4.8 07/17/2013 1216   K 3.8 06/28/2013 0443   K 4.3 08/24/2011  1430   CL 101 06/28/2013 0443   CL 101 06/20/2012 1119   CL 95* 08/24/2011 1430   CO2 29 07/17/2013 1216   CO2 38* 06/28/2013 0443   CO2 31 08/24/2011 1430   BUN 9.3 07/17/2013 1216   BUN 18 06/28/2013 0443   BUN 15 08/24/2011 1430   CREATININE 0.8 07/17/2013 1216   CREATININE 0.59 06/28/2013 0443   CREATININE 0.8 08/24/2011 1430      Component Value Date/Time   CALCIUM 10.2 07/17/2013 1216   CALCIUM 7.8* 06/28/2013 0443   CALCIUM 8.6 08/24/2011 1430   ALKPHOS 96 07/17/2013 1216   ALKPHOS 71 08/24/2011 1430   ALKPHOS 98 06/28/2010 0600   AST 14 07/17/2013 1216   AST 15 08/24/2011 1430   AST 14 06/28/2010 0600   ALT 13 07/17/2013 1216   ALT 7* 08/24/2011 1430   ALT 10 06/28/2010 0600   BILITOT 0.38 07/17/2013 1216   BILITOT 0.50 08/24/2011 1430   BILITOT 0.4 06/28/2010 0600       RADIOGRAPHIC STUDIES:  Ct Chest W Contrast  07/17/2013   CLINICAL DATA:  Lung cancer. Prior chemotherapy and radiation. Restaging.  EXAM: CT CHEST WITH CONTRAST  TECHNIQUE: Multidetector CT imaging of the chest was performed during intravenous contrast administration.  CONTRAST:  21mL OMNIPAQUE IOHEXOL 300 MG/ML  SOLN  COMPARISON:  06/20/2013  FINDINGS: Moderate COPD. Volume loss and extensive airspace disease in the left upper lobe is again noted, unchanged, most likely related to post radiation changes. 9 mm nodule in the superior segment of the right lower  lobe along the major fissure is again noted, unchanged. Focal opacity is better defined posteriorly in the right lower lobe on image 35, measuring 4.8 x 3.3 cm. This area was indistinct and difficult to measure on prior study. On the coronal and sagittal reconstructed images, this area is ovoid with a craniocaudal diameter of approximately 13 mm. This could represent an area of postradiation change, but attention on followup imaging or further evaluation with PET CT may be helpful to rule out tumor. No pleural effusions.  Heart is normal size. No mediastinal, hilar, or axillary adenopathy. Right Port-A-Cath remains in place, unchanged. Chest wall soft tissues are unremarkable. Imaging into the upper abdomen shows no acute findings.  IMPRESSION: Posterior opacity in the right lower lobe is better defined on today's study and may reflect postradiation changes, but recommend attention on followup imaging or further evaluation with PET CT.  Stable volume loss and airspace disease in the left upper lobe, presumably postradiation related.  Moderate underline COPD.   Electronically Signed   By: Rolm Baptise M.D.   On: 07/17/2013 15:14   Ct Angio Chest Pe W/cm &/or Wo Cm  06/20/2013   CLINICAL DATA:  Hypoxemia; hypotensive.  EXAM: CT ANGIOGRAPHY CHEST WITH CONTRAST  TECHNIQUE: Multidetector CT imaging of the chest was performed using the standard protocol during bolus administration of intravenous contrast. Multiplanar CT image reconstructions including MIPs were obtained to evaluate the vascular anatomy.  CONTRAST:  167mL OMNIPAQUE IOHEXOL 350 MG/ML SOLN  COMPARISON:  CT of the chest performed 12/26/2012, and chest radiograph performed 06/19/2013  FINDINGS: There is no evidence of pulmonary embolus. Evaluation for pulmonary embolus is suboptimal in areas of airspace opacification.  A prominent spiculated hazy opacity at the right lower lobe is slightly less well defined than on the prior study, with somewhat increased  architectural distortion. As before, this raises concern for bronchogenic carcinoma. There is now a trace right pleural  effusion. The previously noted nodule along the right major fissure now measures 0.8 cm, increased in size from the prior study, and a new 0.9 cm nodule is seen more inferiorly along the right major fissure. Dense consolidation and volume loss at the left upper lobe is grossly unchanged in appearance. Underlying emphysematous change is again noted bilaterally. There is no evidence of pneumothorax.  A small pericardial effusion is noted, similar in appearance to the prior study. The mediastinum is otherwise unremarkable in appearance. No mediastinal lymphadenopathy is seen. The great vessels are grossly unremarkable in appearance. No axillary lymphadenopathy is seen. The thyroid gland is unremarkable in appearance.  The visualized portions of the liver and spleen are unremarkable.  No acute osseous abnormalities are seen. Postoperative change is noted along the proximal left humerus. A right-sided chest port is noted.  Review of the MIP images confirms the above findings.  IMPRESSION: 1. No evidence for pulmonary embolus. 2. Prominent spiculated hazy opacity at the right lower lung lobe is slightly less well defined, with somewhat increased architectural distortion. As before, this is suspicious for bronchogenic carcinoma. There is now a trace right pleural effusion. Nodules along the right major fissure have also increased in size, or are new from the prior study, raising concern for satellite nodules. 3. Stable chronic dense consolidation involving most of the left upper lung lobe. 4. Bilateral emphysematous change again seen. 5. Stable small pericardial effusion noted.   Electronically Signed   By: Garald Balding M.D.   On: 06/20/2013 01:35   Dg Chest Port 1 View  06/23/2013   CLINICAL DATA:  62 year old female intubated. Acute exacerbation of COPD. Initial encounter.  EXAM: PORTABLE CHEST -  1 VIEW  COMPARISON:  06/22/2013 and earlier.  FINDINGS: Portable AP semi upright view at 0459 hrs. Stable endotracheal tube, tip at the level the clavicles. Stable right chest porta cath. Stable visible enteric tube coursing to the left upper quadrant.  The patient remains rotated to the left. Decreased veiling opacity at the right lung base. Continued subtotal opacification of the left lung base. Chronic left perihilar consolidation/architectural distortion. Stable cardiac size and mediastinal contours. No pneumothorax or large effusion.  IMPRESSION: 1. Stable lines and tubes.  2. Mildly improved ventilation at the right lung base. Stable left lung with lower lobe hypoventilation superimposed on chronic left perihilar consolidation.   Electronically Signed   By: Lars Pinks M.D.   On: 06/23/2013 07:27   Dg Chest Port 1 View  06/22/2013   CLINICAL DATA:  Respiratory failure  EXAM: PORTABLE CHEST - 1 VIEW  COMPARISON:  06/21/2013  FINDINGS: Portions of the left lung are now aerated, specifically left apex and the mid to lower lung zone. Opacity extends from the left hilum to the left lateral pleural margin. There is hazy opacity at the right lung base likely combination of a small effusion and atelectasis.  No convincing pneumothorax.  Endotracheal tube, nasogastric tube and right anterior chest wall power Port-A-Cath are stable in well positioned.  IMPRESSION: Interval improvement.  Aeration has improved in the left lung.  Support apparatus is stable in well positioned.   Electronically Signed   By: Lajean Manes M.D.   On: 06/22/2013 07:18   Dg Chest Port 1 View  06/21/2013   CLINICAL DATA:  Check endotracheal tube position  EXAM: PORTABLE CHEST - 1 VIEW  COMPARISON:  06/20/2013  FINDINGS: Cardiac shadow is stable. There has been significant increase in the degree of left-sided pleural effusion and  consolidation with midline shift towards the left an endotracheal tube is seen 3.7 cm above the carina. A  nasogastric catheter is noted within the stomach. A right-sided chest wall port is again seen. Persistent right basilar changes are noted with increase in right-sided effusion as well.  IMPRESSION: Significant increase in left-sided pleural effusion with near complete opacification of the left hemi thorax. Volume loss and mediastinal shift to the left is noted.  The increase in right-sided pleural effusion when compared with the prior study.  Tubes and lines as described.   Electronically Signed   By: Inez Catalina M.D.   On: 06/21/2013 07:18   Dg Chest Port 1 View  06/20/2013   CLINICAL DATA:  Orogastric tube placement.  Intubated patient.  EXAM: PORTABLE CHEST - 1 VIEW  COMPARISON:  06/20/2013 at 2:18 a.m.  FINDINGS: Orogastric tube passes below the diaphragm and below the included field of view.  Endotracheal tube and right internal jugular power Port-A-Cath are stable in well positioned.  Stable bilateral airspace opacifications. No new lung abnormalities. No pneumothorax.  IMPRESSION: Orogastric tube passes below the diaphragm into the stomach but is not fully imaged on this study.  No other change from the earlier exam.   Electronically Signed   By: Lajean Manes M.D.   On: 06/20/2013 10:04   Dg Chest Port 1 View  06/20/2013   CLINICAL DATA:  Evaluate endotracheal tube  EXAM: PORTABLE CHEST - 1 VIEW  COMPARISON:  Prior CT from the same day as well as earlier chest radiographs.  FINDINGS: Axis right Port-A-Cath is stable in position. Tip of the endotracheal tube is positioned 5.0 cm above the carina. Cardiac silhouette is unchanged.  New dense opacity within the left upper lobe with associated architectural distortion and volume loss is Stable the prior. Hazy right lower lobe opacities again noted, unchanged. Emphysematous changes noted. The left lower lobe appears largely collapsed. No pneumothorax.  Fixation hardware seen within the thoracolumbar spine, partially visualized. Sequelae of prior ORIF  again noted within the proximal left humerus. No acute osseous abnormality.  IMPRESSION: 1. Tip of the endotracheal tube 5.0 cm above the carina. 2. Otherwise stable appearance of the chest with chronic changes of the left hemi thorax and diffuse hazy opacity within the right lung base.   Electronically Signed   By: Jeannine Boga M.D.   On: 06/20/2013 02:46   Dg Chest Port 1 View  06/19/2013   CLINICAL DATA:  Short of breath.  EXAM: PORTABLE CHEST - 1 VIEW  COMPARISON:  06/15/2013.  FINDINGS: Chronic changes of the left hemithorax are present. There is new airspace disease at the right lung base which may represent aspiration, asymmetric pulmonary edema or pneumonia. Cardiopericardial silhouette appears similar. Power port from right IJ approach again noted, unchanged.  IMPRESSION: New right basilar airspace disease favored to represent aspiration or pneumonia. Asymmetric/atypical pulmonary edema is in the differential considerations.   Electronically Signed   By: Dereck Ligas M.D.   On: 06/19/2013 23:40   Dg Shoulder Left  06/28/2013   CLINICAL DATA:  Left shoulder pain, postoperative evaluation  EXAM: LEFT SHOULDER - 2+ VIEW  COMPARISON:  06/16/2013  FINDINGS: Postsurgical changes are noted in the proximal left humerus. A fixation sideplate and multiple fixation screws are again noted. The overall appearance of the fracture is stable.  IMPRESSION: No change from the prior exam.   Electronically Signed   By: Inez Catalina M.D.   On: 06/28/2013 16:13   Dg  Abd Portable 1v  06/20/2013   CLINICAL DATA:  Orogastric tube placement  EXAM: PORTABLE ABDOMEN - 1 VIEW  COMPARISON:  None.  FINDINGS: Orogastric tube passing below the diaphragm into the mid stomach.  Normal bowel gas pattern.  Changes from a along posterior fusion extending from the lower thoracic spine to S1. Orthopedic hardware is well-seated and aligned.  IMPRESSION: 1. Orogastric tube tip lies in the mid stomach, well positioned. 2. No  acute findings within the abdomen.   Electronically Signed   By: Lajean Manes M.D.   On: 06/20/2013 10:05   Dg Swallowing Func-speech Pathology  06/28/2013   Germain Osgood, CCC-SLP     06/28/2013  2:29 PM Objective Swallowing Evaluation: Modified Barium Swallowing Study   Patient Details  Name: Maria Lucero MRN: [id number] Date of Birth: 1950/11/19  Today's Date: 06/28/2013 Time: 1321-1340 SLP Time Calculation (min): 19 min  Past Medical History:  Past Medical History  Diagnosis Date  . Migraine headache   . Anxiety   . Urticaria   . Low back pain syndrome   . Hyperlipidemia   . Hypercholesterolemia   . Depression   . Tobacco abuse   . Myalgia and myositis, unspecified   . Bipolar 1 disorder   . Allergy   . Asthma     per pt stated  . Blood transfusion     several x  . Cataract     recent surgery b/l  . Arthritis   . History of radiation therapy 04/11/12; 04/13/12; 04/15/12;  04/18/12; & 04/20/12    Rll lung 60Gy/5/fx  . GERD (gastroesophageal reflux disease)   . COPD (chronic obstructive pulmonary disease)     home o2 at 2l/min  . Shortness of breath     home o2 at 2l/min  . lung ca dx'd 11/2009    chemo comp 05/2010,home o2 at 2l/min  . Lung cancer     invasive left upper lobe adenocarcinoma  . Complication of anesthesia     low oxygen  . PONV (postoperative nausea and vomiting)   . Fracture of humerus, proximal, left, closed 06/16/2013   Past Surgical History:  Past Surgical History  Procedure Laterality Date  . Back surgery      x 4 L3-S1 1996,2000, 2002  . Vesicovaginal fistula closure w/ tah    . Appendectomy    . Bronchoscopy  2011  . Lung biopsy  2011    needle  . Tonsillectomy    . Abdominal hysterectomy      with b/l oophoorectomy age 30  . Lumbar fusion    . Orif humerus fracture Left 06/16/2013    Procedure: OPEN REDUCTION INTERNAL FIXATION (ORIF) LEFT  SHOULDER;  Surgeon: Johnny Bridge, MD;  Location: [place];   Service: Orthopedics;  Laterality: Left;   HPI:  63 y.o ex smoker s/p ORIF lt  shoulder on 12/19, admitted for  sudden onset sob, failed bipap & intubated (12/23-12/26) in ED  for hypercarbic resp failure & RLL pneumonia.  PMH - bipolar,  severe COPD on 2L [place] ([place]) with h/o Unresectable stage IIB lung  cancer s/p chemo-RT in 2011 & Curative radiotherapy to a right  lower lobe nodule, neg PET 12/2012, GERD, prior admissions for  resp failure attributed to narcotics & muscle relaxants,  Difficult to control bipolar per family- will not go to psych.   CXR Mildly improved ventilation at the right lung base. Stable  left lung with lower lobe hypoventilation superimposed on chronic  left perihilar consolidation.     Assessment / Plan / Recommendation Clinical Impression  Dysphagia Diagnosis: Mild pharyngeal phase dysphagia Clinical impression: Pt presents with a mild sensory-based  pharyngeal dysphagia that appears to fluctuate with pt's  respiratory status. Pharyngeal phase is marked by mild-moderately  delayed swallow initiation at the level of the pyriform sinuses  with all consistencies tested. Delayed initiation resulted in  aspiration before the swallow of thin liquid in mixed consistency  bolus, with cough resulting in what appeared to be increased SOB.  With increased SOB pt demonstrated small amount of silent  aspiration with straw sips of thin liquids; however, when pt's  breathing returned to baseline, she was able to consume small cup  sips of thin liquid with only flash penetration observed. SLP  attempted chin tuck with thin liquids and cup sips of nectar  thick liquids, both of which appeared effective at reducing  aspiration risk as well, and may be considered other options to  pursue in the future. At this time, recommend to continue Dys 3  textures and thin liquids with no straws and extra pauses to  allow for breathing. Skilled intervention included thorough  education regarding results, recommendations, and aspiration  risks.    Treatment Recommendation  Therapy as outlined in  treatment plan below    Diet Recommendation Dysphagia 3 (Mechanical Soft);Thin liquid   Liquid Administration via: Cup;No straw Medication Administration: Whole meds with puree Supervision: Patient able to self feed;Full supervision/cueing  for compensatory strategies Compensations: Slow rate;Small sips/bites Postural Changes and/or Swallow Maneuvers: Seated upright 90  degrees    Other  Recommendations Oral Care Recommendations: Oral care BID   Follow Up Recommendations  None    Frequency and Duration min 1 x/week  1 week   Pertinent Vitals/Pain N/A    SLP Swallow Goals     General Date of Onset:  (chronic) HPI: 63 y.o ex smoker s/p ORIF lt shoulder on 12/19, admitted for  sudden onset sob, failed bipap & intubated (12/23-12/26) in ED  for hypercarbic resp failure & RLL pneumonia.  PMH - bipolar,  severe COPD on 2L [place] ([place]) with h/o Unresectable stage IIB lung  cancer s/p chemo-RT in 2011 & Curative radiotherapy to a right  lower lobe nodule, neg PET 12/2012, GERD, prior admissions for  resp failure attributed to narcotics & muscle relaxants,  Difficult to control bipolar per family- will not go to psych.   CXR Mildly improved ventilation at the right lung base. Stable  left lung with lower lobe hypoventilation superimposed on chronic  left perihilar consolidation. Type of Study: Modified Barium Swallowing Study Reason for Referral: Objectively evaluate swallowing function Previous Swallow Assessment: clinical swallow evaluation 12/29  recommendations for Dys 3/thin liquids Diet Prior to this Study: Dysphagia 3 (soft);Thin liquids Temperature Spikes Noted: No Respiratory Status: Nasal cannula (2L) History of Recent Intubation: Yes Date extubated: 06/23/13 Behavior/Cognition: Alert;Pleasant mood;Cooperative Oral Cavity - Dentition: Dentures, bottom;Dentures, not available  (top dentures still not in hospital) Self-Feeding Abilities: Able to feed self Patient Positioning: Upright in chair Baseline Vocal Quality: Clear  Volitional Cough: Strong Volitional Swallow: Able to elicit Anatomy: Other (Comment) (suspect osteophytes C4-5; MD not  present to confirm) Pharyngeal Secretions: Not observed secondary MBS    Reason for Referral Objectively evaluate swallowing function   Oral Phase Oral Preparation/Oral Phase Oral Phase: WFL   Pharyngeal Phase Pharyngeal Phase Pharyngeal Phase: Impaired Pharyngeal - Nectar Pharyngeal - Nectar Cup: Delayed swallow initiation Pharyngeal - Thin Pharyngeal -  Thin Cup: Delayed swallow initiation;Reduced  airway/laryngeal closure;Penetration/Aspiration during swallow Penetration/Aspiration details (thin cup): Material enters  airway, remains ABOVE vocal cords then ejected out Pharyngeal - Thin Straw: Delayed swallow initiation;Reduced  airway/laryngeal closure;Penetration/Aspiration before swallow Penetration/Aspiration details (thin straw): Material enters  airway, passes BELOW cords and not ejected out despite cough  attempt by patient;Material enters airway, passes BELOW cords  without attempt by patient to eject out (silent aspiration) Pharyngeal - Solids Pharyngeal - Puree: Delayed swallow initiation Pharyngeal - Mechanical Soft: Delayed swallow initiation Pharyngeal - Pill: Delayed swallow initiation  Cervical Esophageal Phase    GO    Cervical Esophageal Phase Cervical Esophageal Phase: [facility]         Germain Osgood, M.A. CCC-SLP [phone]  Germain Osgood 06/28/2013, 2:28 PM     ASSESSMENT AND PLAN: This is a very pleasant 63 years old white female with unresectable stage IIB non-small cell lung cancer status post concurrent chemoradiation followed by consolidation chemotherapy and has been observation with no significant evidence for disease progression. The patient continues to have right lower lobe opacity again suspicious for bronchogenic carcinoma but the previous PET scan that was performed to evaluate that area showed low FDG activity consistent with evolving radiation changes.  I  discussed the scan results with the patient and her family and I showed them the images.  I recommended for her to continue on observation with repeat CT scan of the chest in 3 months for evaluation of the right lower lobe opacity.  I also recommended for the patient to continue on palliative care and hospice as previously recommended for her for the stage COPD. The patient was advised to call immediately she has any concerning symptoms in the interval.  All questions were answered. The patient knows to call the clinic with any problems, questions or concerns. We can certainly see the patient much sooner if necessary. The patient and her family agreed to the current plan.  Disclaimer: This note was dictated with voice recognition software. Similar sounding words can inadvertently be transcribed and may not be corrected upon review.

## 2013-07-19 NOTE — Patient Instructions (Signed)
Followup visit in 3 months with repeat CT scan of the chest.

## 2013-07-19 NOTE — Telephone Encounter (Signed)
Gave pt appt for lab before Ct then Md the next day April 2015

## 2013-07-21 ENCOUNTER — Telehealth: Payer: Self-pay | Admitting: Internal Medicine

## 2013-07-21 NOTE — Telephone Encounter (Signed)
Husband had questions about implications of Hospice called in by hospital physicians. He doesn't feel they are ready to jsut treat her with pain meds and wanted best estimates about the 46-month issue. He is comfortable anticipating she will live longer. He will probably let Hospice go for now and call them back later when more appropriate.

## 2013-07-21 NOTE — Telephone Encounter (Signed)
Spoke with the pt's daughter in law Santiago Glad  She states that pt was d/c'ed home from hospital on hospice  Pt's spouse has multiple questions as to dx and prognosis  Santiago Glad would like to know if CDY could find time to call pt spouse soon to discuss I advised that he is in clinic at this time, and I will send the msg to him Thanks

## 2013-07-28 NOTE — Telephone Encounter (Signed)
Ok to close- I had spoken to husband.

## 2013-07-28 NOTE — Telephone Encounter (Signed)
Please advise if message can be closed? thanks

## 2013-08-13 NOTE — Assessment & Plan Note (Signed)
Hospice

## 2013-08-13 NOTE — Assessment & Plan Note (Signed)
Recent increased wheezing. She does not seem to have an infection and we think her current medications will control this

## 2013-08-13 NOTE — Assessment & Plan Note (Signed)
Persistent but controlled

## 2013-10-16 ENCOUNTER — Ambulatory Visit (HOSPITAL_COMMUNITY)
Admission: RE | Admit: 2013-10-16 | Discharge: 2013-10-16 | Disposition: A | Discharge status: Home / Self Care | Payer: Medicare HMO | Source: Ambulatory Visit | Attending: Internal Medicine | Admitting: Internal Medicine

## 2013-10-16 ENCOUNTER — Encounter (HOSPITAL_COMMUNITY): Payer: Self-pay

## 2013-10-16 ENCOUNTER — Other Ambulatory Visit (HOSPITAL_BASED_OUTPATIENT_CLINIC_OR_DEPARTMENT_OTHER): Payer: Medicare HMO

## 2013-10-16 DIAGNOSIS — C3430 Malignant neoplasm of lower lobe, unspecified bronchus or lung: Secondary | ICD-10-CM

## 2013-10-16 DIAGNOSIS — C3490 Malignant neoplasm of unspecified part of unspecified bronchus or lung: Secondary | ICD-10-CM

## 2013-10-16 DIAGNOSIS — J701 Chronic and other pulmonary manifestations due to radiation: Secondary | ICD-10-CM | POA: Insufficient documentation

## 2013-10-16 DIAGNOSIS — Y842 Radiological procedure and radiotherapy as the cause of abnormal reaction of the patient, or of later complication, without mention of misadventure at the time of the procedure: Secondary | ICD-10-CM | POA: Insufficient documentation

## 2013-10-16 DIAGNOSIS — R0789 Other chest pain: Secondary | ICD-10-CM | POA: Insufficient documentation

## 2013-10-16 DIAGNOSIS — Z9221 Personal history of antineoplastic chemotherapy: Secondary | ICD-10-CM | POA: Insufficient documentation

## 2013-10-16 DIAGNOSIS — J438 Other emphysema: Secondary | ICD-10-CM | POA: Insufficient documentation

## 2013-10-16 DIAGNOSIS — R911 Solitary pulmonary nodule: Secondary | ICD-10-CM | POA: Insufficient documentation

## 2013-10-16 DIAGNOSIS — Z923 Personal history of irradiation: Secondary | ICD-10-CM | POA: Insufficient documentation

## 2013-10-16 DIAGNOSIS — I7090 Unspecified atherosclerosis: Secondary | ICD-10-CM | POA: Insufficient documentation

## 2013-10-16 LAB — COMPREHENSIVE METABOLIC PANEL (CC13)
ALT: <6 U/L (ref 0–55)
ANION GAP: 10 meq/L (ref 3–11)
AST: 13 U/L (ref 5–34)
Albumin: 3.8 g/dL (ref 3.5–5.0)
Alkaline Phosphatase: 76 U/L (ref 40–150)
BILIRUBIN TOTAL: 0.28 mg/dL (ref 0.20–1.20)
BUN: 10.8 mg/dL (ref 7.0–26.0)
CO2: 25 meq/L (ref 22–29)
CREATININE: 0.7 mg/dL (ref 0.6–1.1)
Calcium: 10.1 mg/dL (ref 8.4–10.4)
Chloride: 104 mEq/L (ref 98–109)
GLUCOSE: 86 mg/dL (ref 70–140)
Potassium: 4.3 mEq/L (ref 3.5–5.1)
Sodium: 140 mEq/L (ref 136–145)
Total Protein: 7.2 g/dL (ref 6.4–8.3)

## 2013-10-16 LAB — CBC WITH DIFFERENTIAL/PLATELET
BASO%: 0.8 % (ref 0.0–2.0)
BASOS ABS: 0.1 10*3/uL (ref 0.0–0.1)
EOS ABS: 0.2 10*3/uL (ref 0.0–0.5)
EOS%: 3.4 % (ref 0.0–7.0)
HEMATOCRIT: 38.2 % (ref 34.8–46.6)
HEMOGLOBIN: 12.4 g/dL (ref 11.6–15.9)
LYMPH%: 26.8 % (ref 14.0–49.7)
MCH: 29.9 pg (ref 25.1–34.0)
MCHC: 32.4 g/dL (ref 31.5–36.0)
MCV: 92.4 fL (ref 79.5–101.0)
MONO#: 0.5 10*3/uL (ref 0.1–0.9)
MONO%: 7.9 % (ref 0.0–14.0)
NEUT%: 61.1 % (ref 38.4–76.8)
NEUTROS ABS: 4.1 10*3/uL (ref 1.5–6.5)
PLATELETS: 352 10*3/uL (ref 145–400)
RBC: 4.13 10*6/uL (ref 3.70–5.45)
RDW: 15 % — ABNORMAL HIGH (ref 11.2–14.5)
WBC: 6.6 10*3/uL (ref 3.9–10.3)
lymph#: 1.8 10*3/uL (ref 0.9–3.3)

## 2013-10-16 MED ORDER — IOHEXOL 300 MG/ML  SOLN
80.0000 mL | Freq: Once | INTRAMUSCULAR | Status: AC | PRN
  Administered 2013-10-16: 80 mL via INTRAVENOUS

## 2013-10-17 ENCOUNTER — Ambulatory Visit (HOSPITAL_BASED_OUTPATIENT_CLINIC_OR_DEPARTMENT_OTHER): Payer: Commercial Managed Care - HMO | Admitting: Internal Medicine

## 2013-10-17 ENCOUNTER — Telehealth: Payer: Self-pay | Admitting: Internal Medicine

## 2013-10-17 ENCOUNTER — Encounter: Payer: Self-pay | Admitting: Internal Medicine

## 2013-10-17 VITALS — BP 142/76 | HR 109 | Temp 97.8°F | Resp 20 | Ht 63.0 in | Wt 133.5 lb

## 2013-10-17 DIAGNOSIS — C3430 Malignant neoplasm of lower lobe, unspecified bronchus or lung: Secondary | ICD-10-CM

## 2013-10-17 DIAGNOSIS — R0602 Shortness of breath: Secondary | ICD-10-CM

## 2013-10-17 DIAGNOSIS — C3490 Malignant neoplasm of unspecified part of unspecified bronchus or lung: Secondary | ICD-10-CM

## 2013-10-17 NOTE — Telephone Encounter (Signed)
Gave pt appt for Md and lab for July 2015

## 2013-10-17 NOTE — Progress Notes (Signed)
[facility] [facility] Telephone:([phone]) [phone]   Fax:([phone]  OFFICE PROGRESS NOTE  Shirline Frees, MD [address] [place] [address]  PRINCIPAL DIAGNOSIS: Unresectable stage IIB (T3 N0 M0) non-small cell lung cancer, adenocarcinoma diagnosed in June 2011.   PRIOR THERAPY:  1. Status post concurrent chemoradiation with weekly carboplatin and paclitaxel. Last dose was given February 24, 2010. 2. Consolidation chemotherapy with carboplatin and Alimta, status post 3 cycles. Last dose was given June 09, 2010. 3. Curative radiotherapy to a right lower lobe nodule under the care of Dr. Pablo Ledger completed 04/20/2012.  CURRENT THERAPY: Observation.  INTERVAL HISTORY: Maria Lucero 63 y.o. female returns to the clinic today for followup visit accompanied her husband and her niece. The patient is feeling fine today with no specific complaints except for the baseline shortness of breath and she is currently on home oxygen. She denied having any significant chest pain, cough or hemoptysis. The patient denied having any significant weight loss or night sweats. She has no fever or chills, no nausea or vomiting. She feels tired and fatigued all the time. She had repeat CT scan of the chest performed recently and she is here for evaluation and discussion of her scan results.   MEDICAL HISTORY: Past Medical History  Diagnosis Date  . Migraine headache   . Anxiety   . Urticaria   . Low back pain syndrome   . Hyperlipidemia   . Hypercholesterolemia   . Depression   . Tobacco abuse   . Myalgia and myositis, unspecified   . Bipolar 1 disorder   . Allergy   . Asthma     per pt stated  . Blood transfusion     several x  . Cataract     recent surgery b/l  . Arthritis   . History of radiation therapy 04/11/12; 04/13/12; 04/15/12; 04/18/12; & 04/20/12    Rll lung 60Gy/5/fx  . GERD (gastroesophageal reflux disease)   . COPD (chronic obstructive pulmonary  disease)     home o2 at 2l/min  . Shortness of breath     home o2 at 2l/min  . lung ca dx'd 11/2009    chemo comp 05/2010,home o2 at 2l/min  . Lung cancer     invasive left upper lobe adenocarcinoma  . Complication of anesthesia     low oxygen  . PONV (postoperative nausea and vomiting)   . Fracture of humerus, proximal, left, closed 06/16/2013    ALLERGIES:  is allergic to methocarbamol; morphine sulfate er beads; aspirin; clarithromycin; codeine; lyrica; phenergan; and levaquin.  MEDICATIONS:  Current Outpatient Prescriptions  Medication Sig Dispense Refill  . albuterol (PROVENTIL) (2.5 MG/3ML) 0.083% nebulizer solution Take 2.5 mg by nebulization every 6 (six) hours as needed for wheezing.      . carvedilol (COREG) 6.25 MG tablet Take 1 tablet (6.25 mg total) by mouth 2 (two) times daily with a meal.  60 tablet  0  . clonazePAM (KLONOPIN) 1 MG tablet Take 1-2 mg by mouth 3 (three) times daily.       . divalproex (DEPAKOTE) 250 MG DR tablet Take 750 mg by mouth at bedtime.      . feeding supplement, ENSURE COMPLETE, (ENSURE COMPLETE) LIQD Take 237 mLs by mouth 2 (two) times daily between meals.      . folic acid (FOLVITE) 1 MG tablet Take 1 mg by mouth daily.      . hydroxychloroquine (PLAQUENIL) 200 MG tablet Take  400 mg by mouth daily.       Marland Kitchen lisinopril (PRINIVIL,ZESTRIL) 2.5 MG tablet Take 1 tablet (2.5 mg total) by mouth daily.  30 tablet  0  . omeprazole (PRILOSEC) 20 MG capsule Take 20 mg by mouth daily.        . orphenadrine (NORFLEX) 100 MG tablet       . oxyCODONE-acetaminophen (PERCOCET) 10-325 MG per tablet Take 1-2 tablets by mouth every 6 (six) hours as needed for pain.      . simvastatin (ZOCOR) 40 MG tablet Take 40 mg by mouth daily.       Marland Kitchen tiZANidine (ZANAFLEX) 2 MG tablet       . triamcinolone cream (KENALOG) 0.1 % Apply 1 application topically 2 (two) times daily as needed (for itching).       . [DISCONTINUED] lisinopril-hydrochlorothiazide (PRINZIDE,ZESTORETIC)  10-12.5 MG per tablet Take 1 tablet by mouth.        No current facility-administered medications for this visit.    SURGICAL HISTORY:  Past Surgical History  Procedure Laterality Date  . Back surgery      x 4 L3-S1 1996,2000, 2002  . Vesicovaginal fistula closure w/ tah    . Appendectomy    . Bronchoscopy  2011  . Lung biopsy  2011    needle  . Tonsillectomy    . Abdominal hysterectomy      with b/l oophoorectomy age 52  . Lumbar fusion    . Orif humerus fracture Left 06/16/2013    Procedure: OPEN REDUCTION INTERNAL FIXATION (ORIF) LEFT SHOULDER;  Surgeon: Johnny Bridge, MD;  Location: [facility];  Service: Orthopedics;  Laterality: Left;    REVIEW OF SYSTEMS:  A comprehensive review of systems was negative except for: Constitutional: positive for fatigue Respiratory: positive for dyspnea on exertion   PHYSICAL EXAMINATION: General appearance: alert, cooperative, fatigued and no distress Head: Normocephalic, without obvious abnormality, atraumatic Neck: no adenopathy Lymph nodes: Cervical, supraclavicular, and axillary nodes normal. Resp: clear to auscultation bilaterally Cardio: regular rate and rhythm, S1, S2 normal, no murmur, click, rub or gallop GI: soft, non-tender; bowel sounds normal; no masses,  no organomegaly Extremities: extremities normal, atraumatic, no cyanosis or edema  ECOG PERFORMANCE STATUS: 2 - Symptomatic, <50% confined to bed  Blood pressure 142/76, pulse 109, temperature 97.8 F (36.6 C), temperature source Oral, resp. rate 20, height 5\' 3"  (1.6 m), weight 133 lb 8 oz (60.555 kg).  LABORATORY DATA: Lab Results  Component Value Date   WBC 6.6 10/16/2013   HGB 12.4 10/16/2013   HCT 38.2 10/16/2013   MCV 92.4 10/16/2013   PLT 352 10/16/2013      Chemistry      Component Value Date/Time   NA 140 10/16/2013 1145   NA 145 06/28/2013 0443   NA 145 08/24/2011 1430   K 4.3 10/16/2013 1145   K 3.8 06/28/2013 0443   K 4.3 08/24/2011 1430   CL 101 06/28/2013  0443   CL 101 06/20/2012 1119   CL 95* 08/24/2011 1430   CO2 25 10/16/2013 1145   CO2 38* 06/28/2013 0443   CO2 31 08/24/2011 1430   BUN 10.8 10/16/2013 1145   BUN 18 06/28/2013 0443   BUN 15 08/24/2011 1430   CREATININE 0.7 10/16/2013 1145   CREATININE 0.59 06/28/2013 0443   CREATININE 0.8 08/24/2011 1430      Component Value Date/Time   CALCIUM 10.1 10/16/2013 1145   CALCIUM 7.8* 06/28/2013 0443   CALCIUM 8.6 08/24/2011  1430   ALKPHOS 76 10/16/2013 1145   ALKPHOS 71 08/24/2011 1430   ALKPHOS 98 06/28/2010 0600   AST 13 10/16/2013 1145   AST 15 08/24/2011 1430   AST 14 06/28/2010 0600   ALT <6 10/16/2013 1145   ALT 7* 08/24/2011 1430   ALT 10 06/28/2010 0600   BILITOT 0.28 10/16/2013 1145   BILITOT 0.50 08/24/2011 1430   BILITOT 0.4 06/28/2010 0600       RADIOGRAPHIC STUDIES:  Ct Chest W Contrast  10/16/2013   CLINICAL DATA:  Lung cancer. Chemotherapy and radiation therapy complete. Chest pressure.  EXAM: CT CHEST WITH CONTRAST  TECHNIQUE: Multidetector CT imaging of the chest was performed during intravenous contrast administration.  CONTRAST:  66mL OMNIPAQUE IOHEXOL 300 MG/ML  SOLN  COMPARISON:  07/17/2013.  FINDINGS: Right IJ Port-A-Cath terminates at the SVC RA junction. No pathologically enlarged mediastinal, hilar or axillary lymph nodes. Atherosclerotic calcification of the arterial vasculature. Heart size normal. Small amount of pericardial fluid appears slightly increased from the prior exam.  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 A 9 mm nodule in the right lower lobe (image 29) is unchanged. A small area of consolidation and architectural distortion in the adjacent right lower lobe (image 35) measures 1.5 x 2.4 cm, stable when remeasured on the prior exam. Severe centrilobular emphysema. A focal emphysematous lesion in the lateral segment right middle lobe (series 5, image 42) has peripheral haziness, laterally, unchanged. No pleural fluid.  Airway is otherwise unremarkable.  Incidental imaging of the upper abdomen shows the visualized portions of the liver, adrenal glands, kidneys, spleen, pancreas, stomach and bowel to be grossly unremarkable. No upper abdominal adenopathy. Postoperative changes are seen in the spine. No worrisome lytic or sclerotic lesions. Old left rib fractures.  IMPRESSION: 1. 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 2. Right lower lobe nodule and adjacent nodular consolidation are stable. 3. Focal emphysematous lesion with peripheral soft tissue haziness in the right middle lobe, stable. Continued attention on followup exams is warranted. 4. Small amount of pericardial fluid appears slightly increased from 07/17/2013.   Electronically Signed   By: Lorin Picket M.D.   On: 10/16/2013 15:52   ASSESSMENT AND PLAN: This is a very pleasant 64 years old white female with unresectable stage IIB non-small cell lung cancer status post concurrent chemoradiation followed by consolidation chemotherapy and has been observation with no significant evidence for disease progression.  Her recent CT scan of the chest showed no significant evidence for disease progression but she continues to have the right lower lobe nodule and adjacent nodal or consolidation that are stable. She revised her hospice and palliative care recently I recommended for the patient to continue on observation with repeat CT scan of the chest in 3 months for reevaluation of her disease. The patient was advised to call immediately if she has any concerning symptoms in the interval.  All questions were answered. The patient knows to call the clinic with any problems, questions or concerns. We can certainly see the patient much sooner if necessary. The patient and her family agreed to the current plan.  Disclaimer: This note was dictated with voice recognition software. Similar sounding words can inadvertently be transcribed and  may not be corrected upon review.

## 2013-11-13 ENCOUNTER — Ambulatory Visit: Payer: Medicare HMO | Admitting: Internal Medicine

## 2013-12-25 ENCOUNTER — Telehealth: Payer: Self-pay | Admitting: Internal Medicine

## 2013-12-25 NOTE — Telephone Encounter (Signed)
lvm for pt regardind to July appt changed due to MD on call////mailed pt appt sched and letter

## 2014-01-12 ENCOUNTER — Other Ambulatory Visit (HOSPITAL_BASED_OUTPATIENT_CLINIC_OR_DEPARTMENT_OTHER): Payer: Medicare HMO

## 2014-01-12 ENCOUNTER — Ambulatory Visit (HOSPITAL_COMMUNITY)
Admission: RE | Admit: 2014-01-12 | Discharge: 2014-01-12 | Disposition: A | Discharge status: Home / Self Care | Payer: Medicare HMO | Source: Ambulatory Visit | Attending: Internal Medicine | Admitting: Internal Medicine

## 2014-01-12 ENCOUNTER — Encounter (HOSPITAL_COMMUNITY): Payer: Self-pay

## 2014-01-12 DIAGNOSIS — C3430 Malignant neoplasm of lower lobe, unspecified bronchus or lung: Secondary | ICD-10-CM

## 2014-01-12 DIAGNOSIS — C3490 Malignant neoplasm of unspecified part of unspecified bronchus or lung: Secondary | ICD-10-CM

## 2014-01-12 DIAGNOSIS — Z923 Personal history of irradiation: Secondary | ICD-10-CM | POA: Insufficient documentation

## 2014-01-12 DIAGNOSIS — Z9221 Personal history of antineoplastic chemotherapy: Secondary | ICD-10-CM | POA: Insufficient documentation

## 2014-01-12 DIAGNOSIS — J438 Other emphysema: Secondary | ICD-10-CM | POA: Insufficient documentation

## 2014-01-12 LAB — COMPREHENSIVE METABOLIC PANEL (CC13)
ANION GAP: 12 meq/L — AB (ref 3–11)
AST: 9 U/L (ref 5–34)
Albumin: 3.7 g/dL (ref 3.5–5.0)
Alkaline Phosphatase: 71 U/L (ref 40–150)
BILIRUBIN TOTAL: 0.21 mg/dL (ref 0.20–1.20)
BUN: 12.4 mg/dL (ref 7.0–26.0)
CALCIUM: 10 mg/dL (ref 8.4–10.4)
CHLORIDE: 102 meq/L (ref 98–109)
CO2: 27 meq/L (ref 22–29)
Creatinine: 0.8 mg/dL (ref 0.6–1.1)
GLUCOSE: 80 mg/dL (ref 70–140)
Potassium: 4.8 mEq/L (ref 3.5–5.1)
Sodium: 142 mEq/L (ref 136–145)
TOTAL PROTEIN: 7.3 g/dL (ref 6.4–8.3)

## 2014-01-12 LAB — CBC WITH DIFFERENTIAL/PLATELET
BASO%: 0.3 % (ref 0.0–2.0)
Basophils Absolute: 0 10*3/uL (ref 0.0–0.1)
EOS ABS: 0.3 10*3/uL (ref 0.0–0.5)
EOS%: 4.1 % (ref 0.0–7.0)
HEMATOCRIT: 39.3 % (ref 34.8–46.6)
HGB: 12.8 g/dL (ref 11.6–15.9)
LYMPH#: 1.9 10*3/uL (ref 0.9–3.3)
LYMPH%: 26 % (ref 14.0–49.7)
MCH: 30.5 pg (ref 25.1–34.0)
MCHC: 32.6 g/dL (ref 31.5–36.0)
MCV: 93.8 fL (ref 79.5–101.0)
MONO#: 0.6 10*3/uL (ref 0.1–0.9)
MONO%: 7.8 % (ref 0.0–14.0)
NEUT%: 61.8 % (ref 38.4–76.8)
NEUTROS ABS: 4.5 10*3/uL (ref 1.5–6.5)
PLATELETS: 291 10*3/uL (ref 145–400)
RBC: 4.19 10*6/uL (ref 3.70–5.45)
RDW: 12.9 % (ref 11.2–14.5)
WBC: 7.3 10*3/uL (ref 3.9–10.3)

## 2014-01-12 MED ORDER — IOHEXOL 300 MG/ML  SOLN
80.0000 mL | Freq: Once | INTRAMUSCULAR | Status: AC | PRN
  Administered 2014-01-12: 80 mL via INTRAVENOUS

## 2014-01-16 ENCOUNTER — Ambulatory Visit: Payer: Medicare HMO | Admitting: Internal Medicine

## 2014-01-17 ENCOUNTER — Encounter: Payer: Self-pay | Admitting: Internal Medicine

## 2014-01-17 ENCOUNTER — Telehealth: Payer: Self-pay | Admitting: Internal Medicine

## 2014-01-17 ENCOUNTER — Ambulatory Visit (HOSPITAL_BASED_OUTPATIENT_CLINIC_OR_DEPARTMENT_OTHER): Payer: Commercial Managed Care - HMO | Admitting: Internal Medicine

## 2014-01-17 VITALS — BP 117/59 | HR 98 | Temp 98.2°F | Resp 18 | Ht 63.0 in | Wt 131.5 lb

## 2014-01-17 DIAGNOSIS — J449 Chronic obstructive pulmonary disease, unspecified: Secondary | ICD-10-CM

## 2014-01-17 DIAGNOSIS — C3430 Malignant neoplasm of lower lobe, unspecified bronchus or lung: Secondary | ICD-10-CM

## 2014-01-17 DIAGNOSIS — Z9981 Dependence on supplemental oxygen: Secondary | ICD-10-CM

## 2014-01-17 DIAGNOSIS — R0602 Shortness of breath: Secondary | ICD-10-CM

## 2014-01-17 DIAGNOSIS — C3490 Malignant neoplasm of unspecified part of unspecified bronchus or lung: Secondary | ICD-10-CM

## 2014-01-17 MED ORDER — CYANOCOBALAMIN 1000 MCG/ML IJ SOLN
1000.0000 ug | Freq: Once | INTRAMUSCULAR | Status: AC
  Administered 2014-01-17: 1000 ug via INTRAMUSCULAR

## 2014-01-17 MED ORDER — FOLIC ACID 1 MG PO TABS: 1.0000 mg | ORAL_TABLET | Freq: Every day | ORAL | Status: AC

## 2014-01-17 MED ORDER — DEXAMETHASONE 4 MG PO TABS: ORAL_TABLET | ORAL | Status: DC

## 2014-01-17 MED ORDER — CYANOCOBALAMIN 1000 MCG/ML IJ SOLN
INTRAMUSCULAR | Status: AC
  Filled 2014-01-17: qty 1

## 2014-01-17 MED ORDER — PROCHLORPERAZINE MALEATE 10 MG PO TABS: 10.0000 mg | ORAL_TABLET | Freq: Four times a day (QID) | ORAL | Status: DC | PRN

## 2014-01-17 NOTE — Progress Notes (Signed)
[facility] Telephone:([phone]) [phone]   Fax:(336) [place], MD [address] [place] [address]  PRINCIPAL DIAGNOSIS: Metastatic non-small cell lung cancer initially diagnosed as Unresectable stage IIB (T3 N0 M0) non-small cell lung cancer, adenocarcinoma diagnosed in June 2011.   PRIOR THERAPY:  1. Status post concurrent chemoradiation with weekly carboplatin and paclitaxel. Last dose was given February 24, 2010. 2. Consolidation chemotherapy with carboplatin and Alimta, status post 3 cycles. Last dose was given June 09, 2010. 3. Curative radiotherapy to a right lower lobe nodule under the care of Dr. Pablo Ledger completed 04/20/2012.  CURRENT THERAPY: Systemic chemotherapy with carboplatin for AUC of 5 and Alimta 500 mg/M2 every 3 weeks. First dose 01/24/2014.  INTERVAL HISTORY: Maria Lucero 63 y.o. female returns to the clinic today for followup visit accompanied Maria Lucero. The patient is feeling fine today with no specific complaints except for the baseline shortness of breath and she is currently on home oxygen. She denied having any significant chest pain, cough or hemoptysis. The patient denied having any significant weight loss or night sweats. She has no fever or chills, no nausea or vomiting. She had some skin rash on the right forearm and she was seen recently by Maria primary care physician and was advised to apply hydrocortisone cream to that area. She had repeat CT scan of the chest performed recently and she is here for evaluation and discussion of Maria scan results.   MEDICAL HISTORY: Past Medical History  Diagnosis Date  . Migraine headache   . Anxiety   . Urticaria   . Low back pain syndrome   . Hyperlipidemia   . Hypercholesterolemia   . Depression   . Tobacco abuse   . Myalgia and myositis, unspecified   . Bipolar 1 disorder   . Allergy   . Asthma     per pt stated  . Blood transfusion    several x  . Cataract     recent surgery b/l  . Arthritis   . History of radiation therapy 04/11/12; 04/13/12; 04/15/12; 04/18/12; & 04/20/12    Rll lung 60Gy/5/fx  . GERD (gastroesophageal reflux disease)   . COPD (chronic obstructive pulmonary disease)     home o2 at 2l/min  . Shortness of breath     home o2 at 2l/min  . lung ca dx'd 11/2009    chemo comp 05/2010,home o2 at 2l/min  . Lung cancer     invasive left upper lobe adenocarcinoma  . Complication of anesthesia     low oxygen  . PONV (postoperative nausea and vomiting)   . Fracture of humerus, proximal, left, closed 06/16/2013    ALLERGIES:  is allergic to methocarbamol; morphine sulfate er beads; aspirin; avinza; clarithromycin; codeine; lyrica; phenergan; and levaquin.  MEDICATIONS:  Current Outpatient Prescriptions  Medication Sig Dispense Refill  . albuterol (PROVENTIL) (2.5 MG/3ML) 0.083% nebulizer solution Take 2.5 mg by nebulization every 6 (six) hours as needed for wheezing.      . carvedilol (COREG) 6.25 MG tablet Take 1 tablet (6.25 mg total) by mouth 2 (two) times daily with a meal.  60 tablet  0  . clonazePAM (KLONOPIN) 1 MG tablet Take 1-2 mg by mouth 3 (three) times daily.       . divalproex (DEPAKOTE) 250 MG DR tablet Take 750 mg by mouth at bedtime.      . feeding supplement, ENSURE COMPLETE, (ENSURE COMPLETE)  LIQD Take 237 mLs by mouth 2 (two) times daily between meals.      . folic acid (FOLVITE) 1 MG tablet Take 1 mg by mouth daily.      . hydroxychloroquine (PLAQUENIL) 200 MG tablet Take 400 mg by mouth daily.       Marland Kitchen lisinopril (PRINIVIL,ZESTRIL) 2.5 MG tablet Take 1 tablet (2.5 mg total) by mouth daily.  30 tablet  0  . mirtazapine (REMERON) 15 MG tablet Take 15 mg by mouth at bedtime.      Marland Kitchen omeprazole (PRILOSEC) 20 MG capsule Take 20 mg by mouth daily.        . orphenadrine (NORFLEX) 100 MG tablet       . oxyCODONE-acetaminophen (PERCOCET) 10-325 MG per tablet Take 1-2 tablets by mouth every 6  (six) hours as needed for pain.      . simvastatin (ZOCOR) 40 MG tablet Take 40 mg by mouth daily.       Marland Kitchen tiZANidine (ZANAFLEX) 2 MG tablet       . triamcinolone cream (KENALOG) 0.1 % Apply 1 application topically 2 (two) times daily as needed (for itching).       . [DISCONTINUED] lisinopril-hydrochlorothiazide (PRINZIDE,ZESTORETIC) 10-12.5 MG per tablet Take 1 tablet by mouth.        No current facility-administered medications for this visit.    SURGICAL HISTORY:  Past Surgical History  Procedure Laterality Date  . Back surgery      x 4 L3-S1 1996,2000, 2002  . Vesicovaginal fistula closure w/ tah    . Appendectomy    . Bronchoscopy  2011  . Lung biopsy  2011    needle  . Tonsillectomy    . Abdominal hysterectomy      with b/l oophoorectomy age 68  . Lumbar fusion    . Orif humerus fracture Left 06/16/2013    Procedure: OPEN REDUCTION INTERNAL FIXATION (ORIF) LEFT SHOULDER;  Surgeon: Johnny Bridge, MD;  Location: [facility];  Service: Orthopedics;  Laterality: Left;    REVIEW OF SYSTEMS:  Constitutional: positive for anorexia and fatigue Eyes: negative Ears, nose, mouth, throat, and face: negative Respiratory: positive for cough and dyspnea on exertion Cardiovascular: negative Gastrointestinal: negative Genitourinary:negative Integument/breast: positive for rash Hematologic/lymphatic: negative Musculoskeletal:negative Neurological: negative Behavioral/Psych: negative Endocrine: negative Allergic/Immunologic: negative   PHYSICAL EXAMINATION: General appearance: alert, cooperative, fatigued and no distress Head: Normocephalic, without obvious abnormality, atraumatic Neck: no adenopathy Lymph nodes: Cervical, supraclavicular, and axillary nodes normal. Resp: clear to auscultation bilaterally Back: symmetric, no curvature. ROM normal. No CVA tenderness. Cardio: regular rate and rhythm, S1, S2 normal, no murmur, click, rub or gallop GI: soft, non-tender; bowel sounds  normal; no masses,  no organomegaly Extremities: extremities normal, atraumatic, no cyanosis or edema Neurologic: Alert and oriented X 3, normal strength and tone. Normal symmetric reflexes. Normal coordination and gait  ECOG PERFORMANCE STATUS: 2 - Symptomatic, <50% confined to bed  There were no vitals taken for this visit.  LABORATORY DATA: Lab Results  Component Value Date   WBC 7.3 01/12/2014   HGB 12.8 01/12/2014   HCT 39.3 01/12/2014   MCV 93.8 01/12/2014   PLT 291 01/12/2014      Chemistry      Component Value Date/Time   NA 142 01/12/2014 1353   NA 145 06/28/2013 0443   NA 145 08/24/2011 1430   K 4.8 01/12/2014 1353   K 3.8 06/28/2013 0443   K 4.3 08/24/2011 1430   CL 101 06/28/2013 0443  CL 101 06/20/2012 1119   CL 95* 08/24/2011 1430   CO2 27 01/12/2014 1353   CO2 38* 06/28/2013 0443   CO2 31 08/24/2011 1430   BUN 12.4 01/12/2014 1353   BUN 18 06/28/2013 0443   BUN 15 08/24/2011 1430   CREATININE 0.8 01/12/2014 1353   CREATININE 0.59 06/28/2013 0443   CREATININE 0.8 08/24/2011 1430      Component Value Date/Time   CALCIUM 10.0 01/12/2014 1353   CALCIUM 7.8* 06/28/2013 0443   CALCIUM 8.6 08/24/2011 1430   ALKPHOS 71 01/12/2014 1353   ALKPHOS 71 08/24/2011 1430   ALKPHOS 98 06/28/2010 0600   AST 9 01/12/2014 1353   AST 15 08/24/2011 1430   AST 14 06/28/2010 0600   ALT <6 01/12/2014 1353   ALT 7* 08/24/2011 1430   ALT 10 06/28/2010 0600   BILITOT 0.21 01/12/2014 1353   BILITOT 0.50 08/24/2011 1430   BILITOT 0.4 06/28/2010 0600       RADIOGRAPHIC STUDIES: Ct Chest W Contrast  01/12/2014   CLINICAL DATA:  Followup lung carcinoma. Completed chemotherapy. Previous radiation therapy .  EXAM: CT CHEST WITH CONTRAST  TECHNIQUE: Multidetector CT imaging of the chest was performed during intravenous contrast administration.  CONTRAST:  22mL OMNIPAQUE IOHEXOL 300 MG/ML  SOLN  COMPARISON:  10/16/13  FINDINGS: Chronic left lung volume loss is again demonstrated with parenchymal  opacity and associated bronchiectasis in the left upper lung field ulcer remains stable in appearance. This is most consistent with radiation fibrosis.  Moderate pulmonary emphysema again demonstrated. A poorly defined masslike opacity in the posterior right lower lobe measures 1.6 x 2.4 cm on image 35 and remains stable since previous exam.  A spiculated nodule in the superior segment of the right lower lobe has increased in size, currently measuring 11 x 12 mm on image 28 compared with 9 x 9 mm previously. Other sub-cm pulmonary nodules in the right upper and lower lobes also show increased size since previous study, with 1 of these index nodules measuring 7 mm on image 40 compared with 4 mm previously. These are consistent with pulmonary metastases.  No evidence of pleural or pericardial effusion. No evidence of hilar or mediastinal lymphadenopathy. No adenopathy seen elsewhere within the thorax.  Both adrenal glands remain normal in appearance. No suspicious lytic or sclerotic bone lesions identified.  IMPRESSION: Increased size of several right lung pulmonary metastases, largest in the right lower lobe measuring 12 mm compared with 9 mm previously.  Stable poorly defined masslike opacity in the posterior right lower lobe.  Stable post treatment changes in left hemithorax.  Moderate emphysema.   Electronically Signed   By: Earle Gell M.D.   On: 01/12/2014 17:37   ASSESSMENT AND PLAN: This is a very pleasant 63 years old white female with metastatic non-small cell lung cancer initially diagnosed as unresectable stage IIB non-small cell lung cancer status post concurrent chemoradiation followed by consolidation chemotherapy and has been observation with no significant evidence for disease progression until the recent scan.  Maria recent CT scan of the chest showed increase in the size of several right lung pulmonary metastases concerning for disease progression. I discussed the scan results with the patient and  Maria Lucero. I have a lengthy discussion with the patient and Maria Lucero about Maria current disease status and treatment options. I gave the patient the option of continuous observation and palliative care versus consideration of systemic chemotherapy with carboplatin and Alimta. The patient is interested in proceeding  with chemotherapy and she gives Maria verbal consent.  I discussed with Maria the adverse effect of this treatment including but not limited to alopecia, myelosuppression, nausea and vomiting, peripheral neuropathy, liver or renal dysfunction. She is expected to start the first cycle of this treatment next week. The patient will receive vitamin B12 injection today. I will call Maria pharmacy with prescription for Compazine 10 mg by mouth every 6 hours as needed for nausea, Decadron 4 mg by mouth twice a day the day before, day of and day after the chemotherapy every 3 weeks in addition to folic acid 1 mg by mouth daily. She would come back for followup visit in 2 weeks for evaluation and management any adverse effect of Maria treatment. The patient was advised to call immediately if she has any concerning symptoms in the interval.  All questions were answered. The patient knows to call the clinic with any problems, questions or concerns. We can certainly see the patient much sooner if necessary. The patient and Maria family agreed to the current plan.  Disclaimer: This note was dictated with voice recognition software. Similar sounding words can inadvertently be transcribed and may not be corrected upon review.

## 2014-01-17 NOTE — Telephone Encounter (Signed)
gv pt appt schedule for july/aug.

## 2014-01-22 ENCOUNTER — Telehealth: Payer: Self-pay | Admitting: Medical Oncology

## 2014-01-22 NOTE — Telephone Encounter (Signed)
reviewed meds with pt.

## 2014-01-24 ENCOUNTER — Other Ambulatory Visit (HOSPITAL_BASED_OUTPATIENT_CLINIC_OR_DEPARTMENT_OTHER): Payer: Commercial Managed Care - HMO

## 2014-01-24 ENCOUNTER — Ambulatory Visit (HOSPITAL_BASED_OUTPATIENT_CLINIC_OR_DEPARTMENT_OTHER): Payer: Commercial Managed Care - HMO

## 2014-01-24 VITALS — BP 129/66 | HR 83 | Temp 98.0°F | Resp 18

## 2014-01-24 DIAGNOSIS — C3430 Malignant neoplasm of lower lobe, unspecified bronchus or lung: Secondary | ICD-10-CM

## 2014-01-24 DIAGNOSIS — C3490 Malignant neoplasm of unspecified part of unspecified bronchus or lung: Secondary | ICD-10-CM

## 2014-01-24 DIAGNOSIS — Z5111 Encounter for antineoplastic chemotherapy: Secondary | ICD-10-CM

## 2014-01-24 LAB — CBC WITH DIFFERENTIAL/PLATELET
BASO%: 0.5 % (ref 0.0–2.0)
Basophils Absolute: 0 10*3/uL (ref 0.0–0.1)
EOS%: 0.1 % (ref 0.0–7.0)
Eosinophils Absolute: 0 10*3/uL (ref 0.0–0.5)
HEMATOCRIT: 38.4 % (ref 34.8–46.6)
HGB: 12.6 g/dL (ref 11.6–15.9)
LYMPH#: 1.1 10*3/uL (ref 0.9–3.3)
LYMPH%: 13 % — ABNORMAL LOW (ref 14.0–49.7)
MCH: 30.4 pg (ref 25.1–34.0)
MCHC: 32.8 g/dL (ref 31.5–36.0)
MCV: 92.5 fL (ref 79.5–101.0)
MONO#: 0.2 10*3/uL (ref 0.1–0.9)
MONO%: 2.6 % (ref 0.0–14.0)
NEUT#: 7.3 10*3/uL — ABNORMAL HIGH (ref 1.5–6.5)
NEUT%: 83.8 % — AB (ref 38.4–76.8)
Platelets: 340 10*3/uL (ref 145–400)
RBC: 4.15 10*6/uL (ref 3.70–5.45)
RDW: 13.5 % (ref 11.2–14.5)
WBC: 8.7 10*3/uL (ref 3.9–10.3)

## 2014-01-24 LAB — COMPREHENSIVE METABOLIC PANEL (CC13)
ALT: <6 U/L (ref 0–55)
AST: 11 U/L (ref 5–34)
Albumin: 3.9 g/dL (ref 3.5–5.0)
Alkaline Phosphatase: 71 U/L (ref 40–150)
Anion Gap: 11 meq/L (ref 3–11)
BUN: 14.1 mg/dL (ref 7.0–26.0)
CO2: 28 meq/L (ref 22–29)
Calcium: 10.1 mg/dL (ref 8.4–10.4)
Chloride: 100 meq/L (ref 98–109)
Creatinine: 0.7 mg/dL (ref 0.6–1.1)
Glucose: 109 mg/dL (ref 70–140)
Potassium: 4.1 meq/L (ref 3.5–5.1)
Sodium: 139 meq/L (ref 136–145)
Total Bilirubin: 0.28 mg/dL (ref 0.20–1.20)
Total Protein: 7.6 g/dL (ref 6.4–8.3)

## 2014-01-24 MED ORDER — DEXAMETHASONE SODIUM PHOSPHATE 20 MG/5ML IJ SOLN
20.0000 mg | Freq: Once | INTRAMUSCULAR | Status: AC
  Administered 2014-01-24: 20 mg via INTRAVENOUS

## 2014-01-24 MED ORDER — ONDANSETRON 16 MG/50ML IVPB (CHCC)
INTRAVENOUS | Status: AC
  Filled 2014-01-24: qty 16

## 2014-01-24 MED ORDER — SODIUM CHLORIDE 0.9 % IJ SOLN
10.0000 mL | INTRAMUSCULAR | Status: DC | PRN
  Administered 2014-01-24: 10 mL
  Filled 2014-01-24: qty 10

## 2014-01-24 MED ORDER — SODIUM CHLORIDE 0.9 % IV SOLN
390.0000 mg | Freq: Once | INTRAVENOUS | Status: AC
  Administered 2014-01-24: 390 mg via INTRAVENOUS
  Filled 2014-01-24: qty 39

## 2014-01-24 MED ORDER — ONDANSETRON 16 MG/50ML IVPB (CHCC)
16.0000 mg | Freq: Once | INTRAVENOUS | Status: AC
  Administered 2014-01-24: 16 mg via INTRAVENOUS

## 2014-01-24 MED ORDER — HEPARIN SOD (PORK) LOCK FLUSH 100 UNIT/ML IV SOLN
500.0000 [IU] | Freq: Once | INTRAVENOUS | Status: AC | PRN
Start: 2014-01-24 — End: 2014-01-24
  Administered 2014-01-24: 500 [IU]
  Filled 2014-01-24: qty 5

## 2014-01-24 MED ORDER — DEXAMETHASONE SODIUM PHOSPHATE 20 MG/5ML IJ SOLN
INTRAMUSCULAR | Status: AC
  Filled 2014-01-24: qty 5

## 2014-01-24 MED ORDER — SODIUM CHLORIDE 0.9 % IV SOLN
Freq: Once | INTRAVENOUS | Status: AC
  Administered 2014-01-24: 15:00:00 via INTRAVENOUS

## 2014-01-24 MED ORDER — SODIUM CHLORIDE 0.9 % IV SOLN
500.0000 mg/m2 | Freq: Once | INTRAVENOUS | Status: AC
  Administered 2014-01-24: 825 mg via INTRAVENOUS
  Filled 2014-01-24: qty 33

## 2014-01-24 NOTE — Patient Instructions (Signed)
[facility] Discharge Instructions for Patients Receiving Chemotherapy  Today you received the following chemotherapy agents: Alimta and carboplatin.  To help prevent nausea and vomiting after your treatment, we encourage you to take your nausea medication : compazine 10mg  every 6 hours as needed.   If you develop nausea and vomiting that is not controlled by your nausea medication, call the clinic.   BELOW ARE SYMPTOMS THAT SHOULD BE REPORTED IMMEDIATELY:  *FEVER GREATER THAN 100.5 F  *CHILLS WITH OR WITHOUT FEVER  NAUSEA AND VOMITING THAT IS NOT CONTROLLED WITH YOUR NAUSEA MEDICATION  *UNUSUAL SHORTNESS OF BREATH  *UNUSUAL BRUISING OR BLEEDING  TENDERNESS IN MOUTH AND THROAT WITH OR WITHOUT PRESENCE OF ULCERS  *URINARY PROBLEMS  *BOWEL PROBLEMS  UNUSUAL RASH Items with * indicate a potential emergency and should be followed up as soon as possible.  Feel free to call the clinic you have any questions or concerns. The clinic phone number is [phone].

## 2014-01-25 ENCOUNTER — Telehealth: Payer: Self-pay | Admitting: *Deleted

## 2014-01-25 ENCOUNTER — Encounter: Payer: Self-pay | Admitting: Gastroenterology

## 2014-01-25 NOTE — Telephone Encounter (Signed)
Called pt to check on how she did with 1st chemo yest.  She reports being tired & has slept most of the day.  She also states that her thyroid is low which causes her tiredness also.  She has taken her nausea med once today due to nausea.  She has had no vomiting.  She reports chronic issues with constipation & takes [employer] brand ducolax.  Informed not to go over 3 days without a BM & to call if ducolax doesn't work for her & also to take her nausea med prn & not to let the nausea get out of hand.  She expressed understanding & was very appreciative of the care she received yest.

## 2014-01-25 NOTE — Telephone Encounter (Signed)
Message copied by Jesse Fall on Thu Jan 25, 2014  4:52 PM ------      Message from: [place], [place] P      Created: Thu Jan 25, 2014  1:11 PM      Regarding: chemo follow-up      Contact: [phone]       1st Alimta/Carbo Dr. Benay Spice ------

## 2014-01-31 ENCOUNTER — Telehealth: Payer: Self-pay | Admitting: Physician Assistant

## 2014-01-31 ENCOUNTER — Other Ambulatory Visit (HOSPITAL_BASED_OUTPATIENT_CLINIC_OR_DEPARTMENT_OTHER): Payer: Commercial Managed Care - HMO

## 2014-01-31 ENCOUNTER — Encounter: Payer: Self-pay | Admitting: Physician Assistant

## 2014-01-31 ENCOUNTER — Ambulatory Visit (HOSPITAL_BASED_OUTPATIENT_CLINIC_OR_DEPARTMENT_OTHER): Payer: Commercial Managed Care - HMO | Admitting: Physician Assistant

## 2014-01-31 ENCOUNTER — Telehealth: Payer: Self-pay | Admitting: *Deleted

## 2014-01-31 VITALS — BP 114/60 | HR 96 | Temp 97.6°F | Resp 20 | Ht 63.0 in | Wt 129.6 lb

## 2014-01-31 DIAGNOSIS — C3430 Malignant neoplasm of lower lobe, unspecified bronchus or lung: Secondary | ICD-10-CM

## 2014-01-31 DIAGNOSIS — C3490 Malignant neoplasm of unspecified part of unspecified bronchus or lung: Secondary | ICD-10-CM

## 2014-01-31 LAB — COMPREHENSIVE METABOLIC PANEL (CC13)
ALBUMIN: 3.7 g/dL (ref 3.5–5.0)
ALT: <6 U/L (ref 0–55)
ANION GAP: 10 meq/L (ref 3–11)
AST: 12 U/L (ref 5–34)
Alkaline Phosphatase: 69 U/L (ref 40–150)
BUN: 18 mg/dL (ref 7.0–26.0)
CALCIUM: 9.5 mg/dL (ref 8.4–10.4)
CHLORIDE: 106 meq/L (ref 98–109)
CO2: 25 meq/L (ref 22–29)
Creatinine: 0.7 mg/dL (ref 0.6–1.1)
GLUCOSE: 95 mg/dL (ref 70–140)
Potassium: 3.7 mEq/L (ref 3.5–5.1)
SODIUM: 141 meq/L (ref 136–145)
TOTAL PROTEIN: 7 g/dL (ref 6.4–8.3)
Total Bilirubin: 0.35 mg/dL (ref 0.20–1.20)

## 2014-01-31 LAB — CBC WITH DIFFERENTIAL/PLATELET
BASO%: 1.2 % (ref 0.0–2.0)
Basophils Absolute: 0 10*3/uL (ref 0.0–0.1)
EOS ABS: 0.2 10*3/uL (ref 0.0–0.5)
EOS%: 4.6 % (ref 0.0–7.0)
HEMATOCRIT: 37.3 % (ref 34.8–46.6)
HGB: 12.3 g/dL (ref 11.6–15.9)
LYMPH#: 1.7 10*3/uL (ref 0.9–3.3)
LYMPH%: 41.5 % (ref 14.0–49.7)
MCH: 30.4 pg (ref 25.1–34.0)
MCHC: 32.9 g/dL (ref 31.5–36.0)
MCV: 92.3 fL (ref 79.5–101.0)
MONO#: 0.3 10*3/uL (ref 0.1–0.9)
MONO%: 7.7 % (ref 0.0–14.0)
NEUT#: 1.9 10*3/uL (ref 1.5–6.5)
NEUT%: 45 % (ref 38.4–76.8)
PLATELETS: 242 10*3/uL (ref 145–400)
RBC: 4.04 10*6/uL (ref 3.70–5.45)
RDW: 12.7 % (ref 11.2–14.5)
WBC: 4.2 10*3/uL (ref 3.9–10.3)

## 2014-01-31 NOTE — Telephone Encounter (Signed)
Per [facility] staff message scheduled appts. Advised scheduler 

## 2014-01-31 NOTE — Progress Notes (Addendum)
[facility] [phone]   [phone]) [phone]  SHARED VISIT PROGRESS NOTE  Shirline Frees, MD [place] [id number]  PRINCIPAL DIAGNOSIS: Metastatic non-small cell lung cancer initially diagnosed as Unresectable stage IIB (T3 N0 M0) non-small cell lung cancer, adenocarcinoma diagnosed in June 2011.   PRIOR THERAPY:  1. Status post concurrent chemoradiation with weekly carboplatin and paclitaxel. Last dose was given February 24, 2010. 2. Consolidation chemotherapy with carboplatin and Alimta, status post 3 cycles. Last dose was given June 09, 2010. 3. Curative radiotherapy to a right lower lobe nodule under the care of Dr. Pablo Ledger completed 04/20/2012.  CURRENT THERAPY: Systemic chemotherapy with carboplatin for AUC of 5 and Alimta 500 mg/M2 every 3 weeks. First dose 01/24/2014. Status post 1 cycle.  INTERVAL HISTORY: Maria Lucero 63 y.o. female returns to the clinic today for followup visit accompanied her husband for a symptom management visit. He reports that she did not do well with her first cycle of chemotherapy. She complains of being tired and weak. She stays nauseous even though she is taking her Compazine every 6 hours. She complains of constipation although this is a long-standing issue. She reports that she is taking the folic acid daily and did take the dexamethasone as prescribed. She has a tooth that needs to be pulled although it may the need patch or crown. Her Simona Huh is Dr. Radford Pax and it has been decided that whatever needs to be done we'll wait until she's finished with chemotherapy. Even though she had a rough time with this first cycle of chemotherapy she would like to proceed with further treatment. She continues to have baseline shortness of breath and she is currently on home oxygen. She denied having any significant chest pain, cough or hemoptysis. The patient denied having any significant weight loss or night  sweats. She has no fever or chills.  MEDICAL HISTORY: Past Medical History  Diagnosis Date  . Migraine headache   . Anxiety   . Urticaria   . Low back pain syndrome   . Hyperlipidemia   . Hypercholesterolemia   . Depression   . Tobacco abuse   . Myalgia and myositis, unspecified   . Bipolar 1 disorder   . Allergy   . Asthma     per pt stated  . Blood transfusion     several x  . Cataract     recent surgery b/l  . Arthritis   . History of radiation therapy 04/11/12; 04/13/12; 04/15/12; 04/18/12; & 04/20/12    Rll lung 60Gy/5/fx  . GERD (gastroesophageal reflux disease)   . COPD (chronic obstructive pulmonary disease)     home o2 at 2l/min  . Shortness of breath     home o2 at 2l/min  . lung ca dx'd 11/2009    chemo comp 05/2010,home o2 at 2l/min  . Lung cancer     invasive left upper lobe adenocarcinoma  . Complication of anesthesia     low oxygen  . PONV (postoperative nausea and vomiting)   . Fracture of humerus, proximal, left, closed 06/16/2013    ALLERGIES:  is allergic to methocarbamol; morphine sulfate er beads; aspirin; avinza; clarithromycin; codeine; lyrica; phenergan; and levaquin.  MEDICATIONS:  Current Outpatient Prescriptions  Medication Sig Dispense Refill  . clonazePAM (KLONOPIN) 1 MG tablet Take 1-2 mg by mouth 3 (three) times daily.       . CRESTOR 5 MG tablet Take 5 mg by mouth  daily.      . dexamethasone (DECADRON) 4 MG tablet 4 Milligram by mouth twice a day the day before, day of and day after the chemotherapy every three-week.  40 tablet  1  . divalproex (DEPAKOTE) 250 MG DR tablet Take 750 mg by mouth at bedtime.      Marland Kitchen esomeprazole (NEXIUM) 40 MG capsule Take 40 mg by mouth daily.      . feeding supplement, ENSURE COMPLETE, (ENSURE COMPLETE) LIQD Take 237 mLs by mouth 2 (two) times daily between meals.      . folic acid (FOLVITE) 1 MG tablet Take 1 tablet (1 mg total) by mouth daily.  30 tablet  4  . hydroxychloroquine (PLAQUENIL) 200 MG  tablet Take 400 mg by mouth daily.       Marland Kitchen lisinopril (PRINIVIL,ZESTRIL) 2.5 MG tablet Take 1 tablet (2.5 mg total) by mouth daily.  30 tablet  0  . oxyCODONE-acetaminophen (PERCOCET) 10-325 MG per tablet Take 1-2 tablets by mouth every 6 (six) hours as needed for pain.      Marland Kitchen tiZANidine (ZANAFLEX) 2 MG tablet       . triamcinolone cream (KENALOG) 0.1 % Apply 1 application topically 2 (two) times daily as needed (for itching).       Marland Kitchen albuterol (PROVENTIL) (2.5 MG/3ML) 0.083% nebulizer solution Take 2.5 mg by nebulization every 6 (six) hours as needed for wheezing.      Marland Kitchen amoxicillin-clavulanate (AUGMENTIN) 875-125 MG per tablet       . carvedilol (COREG) 6.25 MG tablet Take 1 tablet (6.25 mg total) by mouth 2 (two) times daily with a meal.  60 tablet  0  . clorazepate (TRANXENE) 3.75 MG tablet Take 3.75 mg by mouth daily.      . fluconazole (DIFLUCAN) 150 MG tablet       . mirtazapine (REMERON) 15 MG tablet       . omeprazole (PRILOSEC) 40 MG capsule       . orphenadrine (NORFLEX) 100 MG tablet       . prochlorperazine (COMPAZINE) 10 MG tablet Take 1 tablet (10 mg total) by mouth every 6 (six) hours as needed for nausea or vomiting.  30 tablet  0  . [DISCONTINUED] lisinopril-hydrochlorothiazide (PRINZIDE,ZESTORETIC) 10-12.5 MG per tablet Take 1 tablet by mouth.        No current facility-administered medications for this visit.    SURGICAL HISTORY:  Past Surgical History  Procedure Laterality Date  . Back surgery      x 4 L3-S1 1996,2000, 2002  . Vesicovaginal fistula closure w/ tah    . Appendectomy    . Bronchoscopy  2011  . Lung biopsy  2011    needle  . Tonsillectomy    . Abdominal hysterectomy      with b/l oophoorectomy age 67  . Lumbar fusion    . Orif humerus fracture Left 06/16/2013    Procedure: OPEN REDUCTION INTERNAL FIXATION (ORIF) LEFT SHOULDER;  Surgeon: Johnny Bridge, MD;  Location: [facility];  Service: Orthopedics;  Laterality: Left;    REVIEW OF SYSTEMS:   Constitutional: positive for anorexia, fatigue and malaise Eyes: negative Ears, nose, mouth, throat, and face: negative Respiratory: positive for cough and dyspnea on exertion Cardiovascular: negative Gastrointestinal: positive for nausea and vomiting Genitourinary:negative Integument/breast: negative Hematologic/lymphatic: negative Musculoskeletal:negative Neurological: negative Behavioral/Psych: negative Endocrine: negative Allergic/Immunologic: negative   PHYSICAL EXAMINATION: General appearance: alert, cooperative, fatigued and no distress Head: Normocephalic, without obvious abnormality, atraumatic Neck: no adenopathy Lymph nodes:  Cervical, supraclavicular, and axillary nodes normal. Resp: clear to auscultation bilaterally Back: symmetric, no curvature. ROM normal. No CVA tenderness. Cardio: regular rate and rhythm, S1, S2 normal, no murmur, click, rub or gallop GI: soft, non-tender; bowel sounds normal; no masses,  no organomegaly Extremities: extremities normal, atraumatic, no cyanosis or edema Neurologic: Alert and oriented X 3, normal strength and tone. Normal symmetric reflexes. Normal coordination and gait  ECOG PERFORMANCE STATUS: 2 - Symptomatic, <50% confined to bed  Blood pressure 114/60, pulse 96, temperature 97.6 F (36.4 C), temperature source Oral, resp. rate 20, height 5\' 3"  (1.6 m), weight 129 lb 9.6 oz (58.786 kg), SpO2 100.00%.  LABORATORY DATA: Lab Results  Component Value Date   WBC 4.2 01/31/2014   HGB 12.3 01/31/2014   HCT 37.3 01/31/2014   MCV 92.3 01/31/2014   PLT 242 01/31/2014      Chemistry      Component Value Date/Time   NA 141 01/31/2014 1427   NA 145 06/28/2013 0443   NA 145 08/24/2011 1430   K 3.7 01/31/2014 1427   K 3.8 06/28/2013 0443   K 4.3 08/24/2011 1430   CL 101 06/28/2013 0443   CL 101 06/20/2012 1119   CL 95* 08/24/2011 1430   CO2 25 01/31/2014 1427   CO2 38* 06/28/2013 0443   CO2 31 08/24/2011 1430   BUN 18.0 01/31/2014 1427   BUN 18  06/28/2013 0443   BUN 15 08/24/2011 1430   CREATININE 0.7 01/31/2014 1427   CREATININE 0.59 06/28/2013 0443   CREATININE 0.8 08/24/2011 1430      Component Value Date/Time   CALCIUM 9.5 01/31/2014 1427   CALCIUM 7.8* 06/28/2013 0443   CALCIUM 8.6 08/24/2011 1430   ALKPHOS 69 01/31/2014 1427   ALKPHOS 71 08/24/2011 1430   ALKPHOS 98 06/28/2010 0600   AST 12 01/31/2014 1427   AST 15 08/24/2011 1430   AST 14 06/28/2010 0600   ALT <6 01/31/2014 1427   ALT 7* 08/24/2011 1430   ALT 10 06/28/2010 0600   BILITOT 0.35 01/31/2014 1427   BILITOT 0.50 08/24/2011 1430   BILITOT 0.4 06/28/2010 0600       RADIOGRAPHIC STUDIES: Ct Chest W Contrast  01/12/2014   CLINICAL DATA:  Followup lung carcinoma. Completed chemotherapy. Previous radiation therapy .  EXAM: CT CHEST WITH CONTRAST  TECHNIQUE: Multidetector CT imaging of the chest was performed during intravenous contrast administration.  CONTRAST:  45mL OMNIPAQUE IOHEXOL 300 MG/ML  SOLN  COMPARISON:  10/16/13  FINDINGS: Chronic left lung volume loss is again demonstrated with parenchymal opacity and associated bronchiectasis in the left upper lung field ulcer remains stable in appearance. This is most consistent with radiation fibrosis.  Moderate pulmonary emphysema again demonstrated. A poorly defined masslike opacity in the posterior right lower lobe measures 1.6 x 2.4 cm on image 35 and remains stable since previous exam.  A spiculated nodule in the superior segment of the right lower lobe has increased in size, currently measuring 11 x 12 mm on image 28 compared with 9 x 9 mm previously. Other sub-cm pulmonary nodules in the right upper and lower lobes also show increased size since previous study, with 1 of these index nodules measuring 7 mm on image 40 compared with 4 mm previously. These are consistent with pulmonary metastases.  No evidence of pleural or pericardial effusion. No evidence of hilar or mediastinal lymphadenopathy. No adenopathy seen elsewhere within  the thorax.  Both adrenal glands remain normal in appearance. No  suspicious lytic or sclerotic bone lesions identified.  IMPRESSION: Increased size of several right lung pulmonary metastases, largest in the right lower lobe measuring 12 mm compared with 9 mm previously.  Stable poorly defined masslike opacity in the posterior right lower lobe.  Stable post treatment changes in left hemithorax.  Moderate emphysema.   Electronically Signed   By: Earle Gell M.D.   On: 01/12/2014 17:37   ASSESSMENT AND PLAN: This is a very pleasant 63 years old white female with metastatic non-small cell lung cancer initially diagnosed as unresectable stage IIB non-small cell lung cancer status post concurrent chemoradiation followed by consolidation chemotherapy and has been observation with no significant evidence for disease progression until the recent scan.  Her recent CT scan of the chest showed increase in the size of several right lung pulmonary metastases concerning for disease progression. She's currently being treated with systemic chemotherapy in the form of carboplatin and Alimta. She did not tolerate her for this first cycle of carboplatin and Alimta are very well. Patient was discussed with and also seen by Dr. Julien Nordmann. She will continue with weekly labs as scheduled and return in 2 weeks prior to the start of cycle #2. At that visit we will again discuss with the patient was due proceed with continued treatment versus returning to observation or entering into hospice/palliative care. The patient was advised to call immediately if she has any concerning symptoms in the interval.  All questions were answered. The patient knows to call the clinic with any problems, questions or concerns. We can certainly see the patient much sooner if necessary.  Carlton Adam PA-C  ADDENDUM: Hematology/Oncology Attending: I had a face to face encounter with the patient. I recommended her care plan. This is a very pleasant  63 years old white female with recurrent non-small cell lung cancer, adenocarcinoma who just started treatment with systemic chemotherapy in the form of carboplatin and Alimta status post 1 cycle given last week. She tolerated the first week of her treatment well except for mild nausea and fatigue. She also has persistent constipation. She denied having any significant fever or chills. She continues to have the baseline shortness of breath secondary to severe COPD and she is currently on home oxygen. We'll continue to monitor the patient closely and she would come back for follow up visit in 2 weeks with the start of cycle #2. She was advised to call immediately if she has any concerning symptoms in the interval. Eilleen Kempf., MD 02/09/2014   Disclaimer: This note was dictated with voice recognition software. Similar sounding words can inadvertently be transcribed and may not be corrected upon review.

## 2014-01-31 NOTE — Telephone Encounter (Signed)
Pt confirmed labs/ov per 08/05 POF, gave pt AVS....KJ °

## 2014-02-02 ENCOUNTER — Telehealth: Payer: Self-pay | Admitting: *Deleted

## 2014-02-02 NOTE — Telephone Encounter (Signed)
Wt Readings from Last 3 Encounters:  01/31/14 129 lb 9.6 oz (58.786 kg)  01/17/14 131 lb 8 oz (59.648 kg)  10/17/13 133 lb 8 oz (60.555 kg)   Dx Lung CA. Spoke with pt via telephone.  Pt states she is not eating well due to decreased appetite and nausea.  Drinking 1-2 Boost/day and eating mostly sandwiches.  Encouraged small frequent high kcal/high protein meals and snacks with supplements as tolerated.  Mailed handouts on increased kcal and protein, dealing with nausea and coupons for [place] [address] contact information also provided for questions or support.

## 2014-02-03 NOTE — Patient Instructions (Signed)
Continue weekly labs as scheduled Follow up in 2 weeks, prior to your next scheduled cycle of chemotherapy

## 2014-02-07 ENCOUNTER — Other Ambulatory Visit (HOSPITAL_BASED_OUTPATIENT_CLINIC_OR_DEPARTMENT_OTHER): Payer: Commercial Managed Care - HMO

## 2014-02-07 ENCOUNTER — Telehealth: Payer: Self-pay | Admitting: Physician Assistant

## 2014-02-07 DIAGNOSIS — C3430 Malignant neoplasm of lower lobe, unspecified bronchus or lung: Secondary | ICD-10-CM

## 2014-02-07 DIAGNOSIS — C3490 Malignant neoplasm of unspecified part of unspecified bronchus or lung: Secondary | ICD-10-CM

## 2014-02-07 LAB — COMPREHENSIVE METABOLIC PANEL (CC13)
ALT: 6 U/L (ref 0–55)
ANION GAP: 12 meq/L — AB (ref 3–11)
AST: 11 U/L (ref 5–34)
Albumin: 3.7 g/dL (ref 3.5–5.0)
Alkaline Phosphatase: 67 U/L (ref 40–150)
BUN: 12.3 mg/dL (ref 7.0–26.0)
CALCIUM: 9.7 mg/dL (ref 8.4–10.4)
CHLORIDE: 105 meq/L (ref 98–109)
CO2: 26 meq/L (ref 22–29)
Creatinine: 0.7 mg/dL (ref 0.6–1.1)
Glucose: 110 mg/dl (ref 70–140)
POTASSIUM: 3.9 meq/L (ref 3.5–5.1)
SODIUM: 143 meq/L (ref 136–145)
TOTAL PROTEIN: 6.9 g/dL (ref 6.4–8.3)
Total Bilirubin: 0.22 mg/dL (ref 0.20–1.20)

## 2014-02-07 LAB — CBC WITH DIFFERENTIAL/PLATELET
BASO%: 0.3 % (ref 0.0–2.0)
Basophils Absolute: 0 10*3/uL (ref 0.0–0.1)
EOS%: 1.7 % (ref 0.0–7.0)
Eosinophils Absolute: 0.1 10*3/uL (ref 0.0–0.5)
HEMATOCRIT: 37.2 % (ref 34.8–46.6)
HGB: 12.3 g/dL (ref 11.6–15.9)
LYMPH#: 1.6 10*3/uL (ref 0.9–3.3)
LYMPH%: 38.3 % (ref 14.0–49.7)
MCH: 30.4 pg (ref 25.1–34.0)
MCHC: 32.9 g/dL (ref 31.5–36.0)
MCV: 92.4 fL (ref 79.5–101.0)
MONO#: 0.4 10*3/uL (ref 0.1–0.9)
MONO%: 10.3 % (ref 0.0–14.0)
NEUT#: 2 10*3/uL (ref 1.5–6.5)
NEUT%: 49.4 % (ref 38.4–76.8)
Platelets: 240 10*3/uL (ref 145–400)
RBC: 4.03 10*6/uL (ref 3.70–5.45)
RDW: 12.8 % (ref 11.2–14.5)
WBC: 4.1 10*3/uL (ref 3.9–10.3)

## 2014-02-07 NOTE — Telephone Encounter (Signed)
per pt req to get updated sch-printed & gave pt copy

## 2014-02-14 ENCOUNTER — Ambulatory Visit: Payer: Commercial Managed Care - HMO

## 2014-02-14 ENCOUNTER — Ambulatory Visit (HOSPITAL_BASED_OUTPATIENT_CLINIC_OR_DEPARTMENT_OTHER): Payer: Commercial Managed Care - HMO

## 2014-02-14 ENCOUNTER — Telehealth: Payer: Self-pay | Admitting: Internal Medicine

## 2014-02-14 ENCOUNTER — Ambulatory Visit (HOSPITAL_BASED_OUTPATIENT_CLINIC_OR_DEPARTMENT_OTHER): Payer: Commercial Managed Care - HMO | Admitting: Physician Assistant

## 2014-02-14 ENCOUNTER — Other Ambulatory Visit (HOSPITAL_BASED_OUTPATIENT_CLINIC_OR_DEPARTMENT_OTHER): Payer: Medicare Other

## 2014-02-14 ENCOUNTER — Encounter: Payer: Self-pay | Admitting: Internal Medicine

## 2014-02-14 VITALS — BP 124/72 | HR 111 | Temp 98.2°F | Resp 18 | Ht 63.0 in | Wt 128.4 lb

## 2014-02-14 DIAGNOSIS — C3490 Malignant neoplasm of unspecified part of unspecified bronchus or lung: Secondary | ICD-10-CM

## 2014-02-14 DIAGNOSIS — C3430 Malignant neoplasm of lower lobe, unspecified bronchus or lung: Secondary | ICD-10-CM

## 2014-02-14 DIAGNOSIS — Z5111 Encounter for antineoplastic chemotherapy: Secondary | ICD-10-CM

## 2014-02-14 LAB — COMPREHENSIVE METABOLIC PANEL (CC13)
ALBUMIN: 4.2 g/dL (ref 3.5–5.0)
ALK PHOS: 71 U/L (ref 40–150)
ALT: <6 U/L (ref 0–55)
AST: 8 U/L (ref 5–34)
Anion Gap: 14 mEq/L — ABNORMAL HIGH (ref 3–11)
BUN: 12.3 mg/dL (ref 7.0–26.0)
CHLORIDE: 101 meq/L (ref 98–109)
CO2: 22 mEq/L (ref 22–29)
CREATININE: 0.8 mg/dL (ref 0.6–1.1)
Calcium: 10.3 mg/dL (ref 8.4–10.4)
Glucose: 110 mg/dl (ref 70–140)
POTASSIUM: 4.2 meq/L (ref 3.5–5.1)
Sodium: 137 mEq/L (ref 136–145)
Total Bilirubin: 0.21 mg/dL (ref 0.20–1.20)
Total Protein: 7.6 g/dL (ref 6.4–8.3)

## 2014-02-14 LAB — CBC WITH DIFFERENTIAL/PLATELET
BASO%: 0.4 % (ref 0.0–2.0)
Basophils Absolute: 0 10*3/uL (ref 0.0–0.1)
EOS%: 0 % (ref 0.0–7.0)
Eosinophils Absolute: 0 10*3/uL (ref 0.0–0.5)
HCT: 38.8 % (ref 34.8–46.6)
HGB: 12.9 g/dL (ref 11.6–15.9)
LYMPH%: 19.1 % (ref 14.0–49.7)
MCH: 30.8 pg (ref 25.1–34.0)
MCHC: 33.3 g/dL (ref 31.5–36.0)
MCV: 92.4 fL (ref 79.5–101.0)
MONO#: 0.2 10*3/uL (ref 0.1–0.9)
MONO%: 5 % (ref 0.0–14.0)
NEUT#: 3.7 10*3/uL (ref 1.5–6.5)
NEUT%: 75.5 % (ref 38.4–76.8)
Platelets: 493 10*3/uL — ABNORMAL HIGH (ref 145–400)
RBC: 4.2 10*6/uL (ref 3.70–5.45)
RDW: 13.8 % (ref 11.2–14.5)
WBC: 4.9 10*3/uL (ref 3.9–10.3)
lymph#: 0.9 10*3/uL (ref 0.9–3.3)

## 2014-02-14 MED ORDER — DEXAMETHASONE SODIUM PHOSPHATE 20 MG/5ML IJ SOLN
INTRAMUSCULAR | Status: AC
  Filled 2014-02-14: qty 5

## 2014-02-14 MED ORDER — DEXAMETHASONE SODIUM PHOSPHATE 20 MG/5ML IJ SOLN
20.0000 mg | Freq: Once | INTRAMUSCULAR | Status: AC
  Administered 2014-02-14: 20 mg via INTRAVENOUS

## 2014-02-14 MED ORDER — ONDANSETRON 16 MG/50ML IVPB (CHCC)
16.0000 mg | Freq: Once | INTRAVENOUS | Status: AC
  Administered 2014-02-14: 16 mg via INTRAVENOUS

## 2014-02-14 MED ORDER — SODIUM CHLORIDE 0.9 % IJ SOLN
10.0000 mL | INTRAMUSCULAR | Status: DC | PRN
  Administered 2014-02-14: 10 mL
  Filled 2014-02-14: qty 10

## 2014-02-14 MED ORDER — SODIUM CHLORIDE 0.9 % IV SOLN
Freq: Once | INTRAVENOUS | Status: AC
  Administered 2014-02-14: 16:00:00 via INTRAVENOUS

## 2014-02-14 MED ORDER — SODIUM CHLORIDE 0.9 % IV SOLN
500.0000 mg/m2 | Freq: Once | INTRAVENOUS | Status: AC
  Administered 2014-02-14: 825 mg via INTRAVENOUS
  Filled 2014-02-14: qty 33

## 2014-02-14 MED ORDER — SODIUM CHLORIDE 0.9 % IV SOLN
390.0000 mg | Freq: Once | INTRAVENOUS | Status: AC
  Administered 2014-02-14: 390 mg via INTRAVENOUS
  Filled 2014-02-14: qty 39

## 2014-02-14 MED ORDER — ONDANSETRON 16 MG/50ML IVPB (CHCC)
INTRAVENOUS | Status: AC
  Filled 2014-02-14: qty 16

## 2014-02-14 MED ORDER — HEPARIN SOD (PORK) LOCK FLUSH 100 UNIT/ML IV SOLN
500.0000 [IU] | Freq: Once | INTRAVENOUS | Status: AC | PRN
  Administered 2014-02-14: 500 [IU]
  Filled 2014-02-14: qty 5

## 2014-02-14 NOTE — Telephone Encounter (Signed)
gv adn printed appt sched and avs for pt for Aug adn Sept

## 2014-02-14 NOTE — Patient Instructions (Signed)
[facility] Discharge Instructions for Patients Receiving Chemotherapy  Today you received the following chemotherapy agents alimta/carboplatin.   To help prevent nausea and vomiting after your treatment, we encourage you to take your nausea medication as directed.    If you develop nausea and vomiting that is not controlled by your nausea medication, call the clinic.   BELOW ARE SYMPTOMS THAT SHOULD BE REPORTED IMMEDIATELY:  *FEVER GREATER THAN 100.5 F  *CHILLS WITH OR WITHOUT FEVER  NAUSEA AND VOMITING THAT IS NOT CONTROLLED WITH YOUR NAUSEA MEDICATION  *UNUSUAL SHORTNESS OF BREATH  *UNUSUAL BRUISING OR BLEEDING  TENDERNESS IN MOUTH AND THROAT WITH OR WITHOUT PRESENCE OF ULCERS  *URINARY PROBLEMS  *BOWEL PROBLEMS  UNUSUAL RASH Items with * indicate a potential emergency and should be followed up as soon as possible.  Feel free to call the clinic you have any questions or concerns. The clinic phone number is [phone].

## 2014-02-16 NOTE — Progress Notes (Addendum)
[facility] [phone]   [phone]  SHARED VISIT PROGRESS NOTE  Maria Frees, MD [place] [id number]  PRINCIPAL DIAGNOSIS: Metastatic non-small cell lung cancer initially diagnosed as Unresectable stage IIB (T3 N0 M0) non-small cell lung cancer, adenocarcinoma diagnosed in June 2011.   PRIOR THERAPY:  1. Status post concurrent chemoradiation with weekly carboplatin and paclitaxel. Last dose was given February 24, 2010. 2. Consolidation chemotherapy with carboplatin and Alimta, status post 3 cycles. Last dose was given June 09, 2010. 3. Curative radiotherapy to a right lower lobe nodule under the care of Dr. Pablo Ledger completed 04/20/2012.  CURRENT THERAPY: Systemic chemotherapy with carboplatin for AUC of 5 and Alimta 500 mg/M2 every 3 weeks. First dose 01/24/2014. Status post 1 cycle.  INTERVAL HISTORY: Maria Lucero 63 y.o. female returns to the clinic today for followup visit accompanied her husband for a follow up visit. He reports that she did not do well with her first cycle of chemotherapy. She complains of being tired and weak. She continues to complain of fatigue. She reports that she is to have thyroid studies back on Friday ordered by her primary care physician. She's not been eating very well. These are a bit already been in contact with our nutritionist to followup with her again today. Over the past 2 weeks she is been a bit up and down in terms of Gen. wise malaise, fatigue and nausea. Today she feels well and is ready to proceed with cycle #2 of her chemotherapy.  She reports that she is taking the folic acid daily and did take the dexamethasone as prescribed. She continues to have baseline shortness of breath and she is currently on home oxygen. She denied having any significant chest pain, cough or hemoptysis. The patient denied having any significant weight loss or night sweats. She has no fever or  chills.  MEDICAL HISTORY: Past Medical History  Diagnosis Date  . Migraine headache   . Anxiety   . Urticaria   . Low back pain syndrome   . Hyperlipidemia   . Hypercholesterolemia   . Depression   . Tobacco abuse   . Myalgia and myositis, unspecified   . Bipolar 1 disorder   . Allergy   . Asthma     per pt stated  . Blood transfusion     several x  . Cataract     recent surgery b/l  . Arthritis   . History of radiation therapy 04/11/12; 04/13/12; 04/15/12; 04/18/12; & 04/20/12    Rll lung 60Gy/5/fx  . GERD (gastroesophageal reflux disease)   . COPD (chronic obstructive pulmonary disease)     home o2 at 2l/min  . Shortness of breath     home o2 at 2l/min  . lung ca dx'd 11/2009    chemo comp 05/2010,home o2 at 2l/min  . Lung cancer     invasive left upper lobe adenocarcinoma  . Complication of anesthesia     low oxygen  . PONV (postoperative nausea and vomiting)   . Fracture of humerus, proximal, left, closed 06/16/2013    ALLERGIES:  is allergic to methocarbamol; morphine sulfate er beads; aspirin; avinza; clarithromycin; codeine; lyrica; phenergan; and levaquin.  MEDICATIONS:  Current Outpatient Prescriptions  Medication Sig Dispense Refill  . albuterol (PROVENTIL) (2.5 MG/3ML) 0.083% nebulizer solution Take 2.5 mg by nebulization every 6 (six) hours as needed for wheezing.      . carvedilol (COREG) 6.25  MG tablet Take 1 tablet (6.25 mg total) by mouth 2 (two) times daily with a meal.  60 tablet  0  . clonazePAM (KLONOPIN) 1 MG tablet Take 1-2 mg by mouth 3 (three) times daily.       . CRESTOR 5 MG tablet Take 5 mg by mouth daily.      Marland Kitchen dexamethasone (DECADRON) 4 MG tablet 4 Milligram by mouth twice a day the day before, day of and day after the chemotherapy every three-week.  40 tablet  1  . divalproex (DEPAKOTE) 250 MG DR tablet Take 750 mg by mouth at bedtime.      Marland Kitchen esomeprazole (NEXIUM) 40 MG capsule Take 40 mg by mouth daily.      . folic acid (FOLVITE) 1  MG tablet Take 1 tablet (1 mg total) by mouth daily.  30 tablet  4  . hydroxychloroquine (PLAQUENIL) 200 MG tablet Take 400 mg by mouth daily.       Marland Kitchen oxyCODONE-acetaminophen (PERCOCET) 10-325 MG per tablet Take 1-2 tablets by mouth every 6 (six) hours as needed for pain.      Marland Kitchen prochlorperazine (COMPAZINE) 10 MG tablet Take 1 tablet (10 mg total) by mouth every 6 (six) hours as needed for nausea or vomiting.  30 tablet  0  . triamcinolone cream (KENALOG) 0.1 % Apply 1 application topically 2 (two) times daily as needed (for itching).       . [DISCONTINUED] lisinopril-hydrochlorothiazide (PRINZIDE,ZESTORETIC) 10-12.5 MG per tablet Take 1 tablet by mouth.        No current facility-administered medications for this visit.    SURGICAL HISTORY:  Past Surgical History  Procedure Laterality Date  . Back surgery      x 4 L3-S1 1996,2000, 2002  . Vesicovaginal fistula closure w/ tah    . Appendectomy    . Bronchoscopy  2011  . Lung biopsy  2011    needle  . Tonsillectomy    . Abdominal hysterectomy      with b/l oophoorectomy age 42  . Lumbar fusion    . Orif humerus fracture Left 06/16/2013    Procedure: OPEN REDUCTION INTERNAL FIXATION (ORIF) LEFT SHOULDER;  Surgeon: Johnny Bridge, MD;  Location: [facility];  Service: Orthopedics;  Laterality: Left;    REVIEW OF SYSTEMS:  Constitutional: positive for anorexia, fatigue and malaise Eyes: negative Ears, nose, mouth, throat, and face: negative Respiratory: positive for cough and dyspnea on exertion Cardiovascular: negative Gastrointestinal: positive for nausea and vomiting Genitourinary:negative Integument/breast: negative Hematologic/lymphatic: negative Musculoskeletal:negative Neurological: negative Behavioral/Psych: negative Endocrine: negative Allergic/Immunologic: negative   PHYSICAL EXAMINATION: General appearance: alert, cooperative, fatigued and no distress Head: Normocephalic, without obvious abnormality, atraumatic Neck: no  adenopathy Lymph nodes: Cervical, supraclavicular, and axillary nodes normal. Resp: clear to auscultation bilaterally Back: symmetric, no curvature. ROM normal. No CVA tenderness. Cardio: regular rate and rhythm, S1, S2 normal, no murmur, click, rub or gallop GI: soft, non-tender; bowel sounds normal; no masses,  no organomegaly Extremities: extremities normal, atraumatic, no cyanosis or edema Neurologic: Alert and oriented X 3, normal strength and tone. Normal symmetric reflexes. Normal coordination and gait  ECOG PERFORMANCE STATUS: 2 - Symptomatic, <50% confined to bed  Blood pressure 124/72, pulse 111, temperature 98.2 F (36.8 C), temperature source Oral, resp. rate 18, height 5\' 3"  (1.6 m), weight 128 lb 6.4 oz (58.242 kg), SpO2 100.00%.  LABORATORY DATA: Lab Results  Component Value Date   WBC 4.9 02/14/2014   HGB 12.9 02/14/2014   HCT  38.8 02/14/2014   MCV 92.4 02/14/2014   PLT 493* 02/14/2014      Chemistry      Component Value Date/Time   NA 137 02/14/2014 1436   NA 145 06/28/2013 0443   NA 145 08/24/2011 1430   K 4.2 02/14/2014 1436   K 3.8 06/28/2013 0443   K 4.3 08/24/2011 1430   CL 101 06/28/2013 0443   CL 101 06/20/2012 1119   CL 95* 08/24/2011 1430   CO2 22 02/14/2014 1436   CO2 38* 06/28/2013 0443   CO2 31 08/24/2011 1430   BUN 12.3 02/14/2014 1436   BUN 18 06/28/2013 0443   BUN 15 08/24/2011 1430   CREATININE 0.8 02/14/2014 1436   CREATININE 0.59 06/28/2013 0443   CREATININE 0.8 08/24/2011 1430      Component Value Date/Time   CALCIUM 10.3 02/14/2014 1436   CALCIUM 7.8* 06/28/2013 0443   CALCIUM 8.6 08/24/2011 1430   ALKPHOS 71 02/14/2014 1436   ALKPHOS 71 08/24/2011 1430   ALKPHOS 98 06/28/2010 0600   AST 8 02/14/2014 1436   AST 15 08/24/2011 1430   AST 14 06/28/2010 0600   ALT <6 02/14/2014 1436   ALT 7* 08/24/2011 1430   ALT 10 06/28/2010 0600   BILITOT 0.21 02/14/2014 1436   BILITOT 0.50 08/24/2011 1430   BILITOT 0.4 06/28/2010 0600       RADIOGRAPHIC  STUDIES: Ct Chest W Contrast  01/12/2014   CLINICAL DATA:  Followup lung carcinoma. Completed chemotherapy. Previous radiation therapy .  EXAM: CT CHEST WITH CONTRAST  TECHNIQUE: Multidetector CT imaging of the chest was performed during intravenous contrast administration.  CONTRAST:  40mL OMNIPAQUE IOHEXOL 300 MG/ML  SOLN  COMPARISON:  10/16/13  FINDINGS: Chronic left lung volume loss is again demonstrated with parenchymal opacity and associated bronchiectasis in the left upper lung field ulcer remains stable in appearance. This is most consistent with radiation fibrosis.  Moderate pulmonary emphysema again demonstrated. A poorly defined masslike opacity in the posterior right lower lobe measures 1.6 x 2.4 cm on image 35 and remains stable since previous exam.  A spiculated nodule in the superior segment of the right lower lobe has increased in size, currently measuring 11 x 12 mm on image 28 compared with 9 x 9 mm previously. Other sub-cm pulmonary nodules in the right upper and lower lobes also show increased size since previous study, with 1 of these index nodules measuring 7 mm on image 40 compared with 4 mm previously. These are consistent with pulmonary metastases.  No evidence of pleural or pericardial effusion. No evidence of hilar or mediastinal lymphadenopathy. No adenopathy seen elsewhere within the thorax.  Both adrenal glands remain normal in appearance. No suspicious lytic or sclerotic bone lesions identified.  IMPRESSION: Increased size of several right lung pulmonary metastases, largest in the right lower lobe measuring 12 mm compared with 9 mm previously.  Stable poorly defined masslike opacity in the posterior right lower lobe.  Stable post treatment changes in left hemithorax.  Moderate emphysema.   Electronically Signed   By: Earle Gell M.D.   On: 01/12/2014 17:37   ASSESSMENT AND PLAN: This is a very pleasant 63 years old white female with metastatic non-small cell lung cancer initially  diagnosed as unresectable stage IIB non-small cell lung cancer status post concurrent chemoradiation followed by consolidation chemotherapy and has been observation with no significant evidence for disease progression until the recent scan.  Her recent CT scan of the chest showed increase  in the size of several right lung pulmonary metastases concerning for disease progression. She's currently being treated with systemic chemotherapy in the form of carboplatin and Alimta. She did not tolerate her for this first cycle of carboplatin and Alimta are very well. Patient was discussed with and also seen by Dr. Julien Nordmann. Her labs were reviewed and I'll all within treatable range. She'll proceed with cycle #2 of her systemic chemotherapy with carboplatin and Alimta today as scheduled without dose modification. She will continue with weekly labs as scheduled and return in 3 weeks prior to the start of cycle #3. The patient was advised to call immediately if she has any concerning symptoms in the interval.  All questions were answered. The patient knows to call the clinic with any problems, questions or concerns. We can certainly see the patient much sooner if necessary.   Carlton Adam, PA-C 02/16/2014  ADDENDUM: Hematology/Oncology Attending: I had a face to face encounter with the patient. I recommended her care plan. This is a very pleasant 63 years old white female with metastatic non-small cell lung cancer and industries COPD. She is currently undergoing systemic chemotherapy with carboplatin and Alimta status post 1 cycle. She tolerated the first cycle of her treatment well except for persistent fatigue. I have a lengthy discussion with the patient and her husband today about her condition. I gave her the option of discontinuing treatment and consideration of palliative care and hospice referral. The patient would like to continue with her treatment. We will proceed with cycle #2 today as scheduled. The  patient would come back for followup visit in 3 weeks with the next cycle of her treatment. She was advised to call immediately she has any concerning symptoms in the interval.  Disclaimer: This note was dictated with voice recognition software. Similar sounding words can inadvertently be transcribed and may be missed upon review. Eilleen Kempf., MD 02/17/2014

## 2014-02-16 NOTE — Patient Instructions (Signed)
Continue weekly labs have scheduled Followup in 3 weeks prior to starting next scheduled cycle of chemotherapy

## 2014-02-21 ENCOUNTER — Other Ambulatory Visit (HOSPITAL_BASED_OUTPATIENT_CLINIC_OR_DEPARTMENT_OTHER): Payer: Commercial Managed Care - HMO

## 2014-02-21 DIAGNOSIS — C3430 Malignant neoplasm of lower lobe, unspecified bronchus or lung: Secondary | ICD-10-CM

## 2014-02-21 DIAGNOSIS — C3490 Malignant neoplasm of unspecified part of unspecified bronchus or lung: Secondary | ICD-10-CM

## 2014-02-21 LAB — COMPREHENSIVE METABOLIC PANEL (CC13)
ALBUMIN: 3.6 g/dL (ref 3.5–5.0)
ALT: 9 U/L (ref 0–55)
AST: 13 U/L (ref 5–34)
Alkaline Phosphatase: 64 U/L (ref 40–150)
Anion Gap: 9 mEq/L (ref 3–11)
BUN: 17.8 mg/dL (ref 7.0–26.0)
CALCIUM: 9.5 mg/dL (ref 8.4–10.4)
CHLORIDE: 105 meq/L (ref 98–109)
CO2: 26 mEq/L (ref 22–29)
Creatinine: 0.7 mg/dL (ref 0.6–1.1)
Glucose: 93 mg/dl (ref 70–140)
POTASSIUM: 4.2 meq/L (ref 3.5–5.1)
SODIUM: 140 meq/L (ref 136–145)
Total Bilirubin: 0.43 mg/dL (ref 0.20–1.20)
Total Protein: 7 g/dL (ref 6.4–8.3)

## 2014-02-21 LAB — CBC WITH DIFFERENTIAL/PLATELET
BASO%: 1.3 % (ref 0.0–2.0)
BASOS ABS: 0 10*3/uL (ref 0.0–0.1)
EOS ABS: 0.1 10*3/uL (ref 0.0–0.5)
EOS%: 2.2 % (ref 0.0–7.0)
HCT: 36.6 % (ref 34.8–46.6)
HGB: 12 g/dL (ref 11.6–15.9)
LYMPH#: 0.9 10*3/uL (ref 0.9–3.3)
LYMPH%: 34.8 % (ref 14.0–49.7)
MCH: 30.7 pg (ref 25.1–34.0)
MCHC: 32.8 g/dL (ref 31.5–36.0)
MCV: 93.7 fL (ref 79.5–101.0)
MONO#: 0.2 10*3/uL (ref 0.1–0.9)
MONO%: 8 % (ref 0.0–14.0)
NEUT%: 53.7 % (ref 38.4–76.8)
NEUTROS ABS: 1.4 10*3/uL — AB (ref 1.5–6.5)
Platelets: 214 10*3/uL (ref 145–400)
RBC: 3.91 10*6/uL (ref 3.70–5.45)
RDW: 14 % (ref 11.2–14.5)
WBC: 2.6 10*3/uL — ABNORMAL LOW (ref 3.9–10.3)

## 2014-02-28 ENCOUNTER — Other Ambulatory Visit (HOSPITAL_BASED_OUTPATIENT_CLINIC_OR_DEPARTMENT_OTHER): Payer: Commercial Managed Care - HMO

## 2014-02-28 DIAGNOSIS — C3490 Malignant neoplasm of unspecified part of unspecified bronchus or lung: Secondary | ICD-10-CM

## 2014-02-28 DIAGNOSIS — C3430 Malignant neoplasm of lower lobe, unspecified bronchus or lung: Secondary | ICD-10-CM

## 2014-02-28 LAB — CBC WITH DIFFERENTIAL/PLATELET
BASO%: 0.2 % (ref 0.0–2.0)
Basophils Absolute: 0 10*3/uL (ref 0.0–0.1)
EOS%: 0.4 % (ref 0.0–7.0)
Eosinophils Absolute: 0 10*3/uL (ref 0.0–0.5)
HCT: 32.6 % — ABNORMAL LOW (ref 34.8–46.6)
HGB: 10.8 g/dL — ABNORMAL LOW (ref 11.6–15.9)
LYMPH%: 29.3 % (ref 14.0–49.7)
MCH: 30.9 pg (ref 25.1–34.0)
MCHC: 33.1 g/dL (ref 31.5–36.0)
MCV: 93.1 fL (ref 79.5–101.0)
MONO#: 0.5 10*3/uL (ref 0.1–0.9)
MONO%: 10.4 % (ref 0.0–14.0)
NEUT#: 2.7 10*3/uL (ref 1.5–6.5)
NEUT%: 59.7 % (ref 38.4–76.8)
NRBC: 0 % (ref 0–0)
PLATELETS: 130 10*3/uL — AB (ref 145–400)
RBC: 3.5 10*6/uL — AB (ref 3.70–5.45)
RDW: 14.2 % (ref 11.2–14.5)
WBC: 4.6 10*3/uL (ref 3.9–10.3)
lymph#: 1.4 10*3/uL (ref 0.9–3.3)

## 2014-02-28 LAB — COMPREHENSIVE METABOLIC PANEL (CC13)
ALK PHOS: 66 U/L (ref 40–150)
ALT: 10 U/L (ref 0–55)
AST: 11 U/L (ref 5–34)
Albumin: 3.5 g/dL (ref 3.5–5.0)
Anion Gap: 12 mEq/L — ABNORMAL HIGH (ref 3–11)
BUN: 17.6 mg/dL (ref 7.0–26.0)
CO2: 24 mEq/L (ref 22–29)
CREATININE: 0.7 mg/dL (ref 0.6–1.1)
Calcium: 9.6 mg/dL (ref 8.4–10.4)
Chloride: 106 mEq/L (ref 98–109)
Glucose: 88 mg/dl (ref 70–140)
Potassium: 3.7 mEq/L (ref 3.5–5.1)
SODIUM: 143 meq/L (ref 136–145)
TOTAL PROTEIN: 6.8 g/dL (ref 6.4–8.3)

## 2014-03-06 ENCOUNTER — Telehealth: Payer: Self-pay | Admitting: Dietician

## 2014-03-06 NOTE — Telephone Encounter (Signed)
Brief Outpatient Oncology Nutrition Note  Patient has been identified to be at risk on malnutrition screen.  Wt Readings from Last 10 Encounters:  02/14/14 128 lb 6.4 oz (58.242 kg)  01/31/14 129 lb 9.6 oz (58.786 kg)  01/17/14 131 lb 8 oz (59.648 kg)  10/17/13 133 lb 8 oz (60.555 kg)  07/19/13 139 lb 1.6 oz (63.095 kg)  07/17/13 140 lb 3.2 oz (63.594 kg)  06/29/13 160 lb 0.9 oz (72.6 kg)  06/13/13 148 lb (67.132 kg)  06/13/13 148 lb (67.132 kg)  06/13/13 148 lb (67.132 kg)    Dx:  Metastatic non-small lung cancer  Called patient due to weight loss and per MD note that patient has not been eating well.  Patient reports that she is feeling better.  States that the nausea comes and goes.  Drinks Boost.  Asked for samples.    Will provide samples at the front desk of the cancer center for patient to pick up Wednesday 9/9 along with coupons and contact information for the [facility].  Antonieta Iba, RD, LDN

## 2014-03-07 ENCOUNTER — Other Ambulatory Visit: Payer: Commercial Managed Care - HMO

## 2014-03-07 ENCOUNTER — Ambulatory Visit: Payer: Commercial Managed Care - HMO

## 2014-03-07 ENCOUNTER — Ambulatory Visit: Payer: Commercial Managed Care - HMO | Admitting: Physician Assistant

## 2014-03-08 ENCOUNTER — Telehealth: Payer: Self-pay | Admitting: Internal Medicine

## 2014-03-08 NOTE — Telephone Encounter (Signed)
returned pt call and lvm with d.t. of new sched...pt could not come for 9.9 appt

## 2014-03-09 ENCOUNTER — Telehealth: Payer: Self-pay | Admitting: Internal Medicine

## 2014-03-09 NOTE — Telephone Encounter (Signed)
returned pt call and s.w. pt advised on Sept appt...pt ok and aware

## 2014-03-14 ENCOUNTER — Telehealth: Payer: Self-pay | Admitting: Nurse Practitioner

## 2014-03-14 ENCOUNTER — Ambulatory Visit (HOSPITAL_BASED_OUTPATIENT_CLINIC_OR_DEPARTMENT_OTHER): Payer: Commercial Managed Care - HMO

## 2014-03-14 ENCOUNTER — Other Ambulatory Visit: Payer: Commercial Managed Care - HMO

## 2014-03-14 ENCOUNTER — Other Ambulatory Visit: Payer: Self-pay | Admitting: Internal Medicine

## 2014-03-14 ENCOUNTER — Ambulatory Visit: Payer: Commercial Managed Care - HMO | Admitting: Nurse Practitioner

## 2014-03-14 ENCOUNTER — Other Ambulatory Visit: Payer: Self-pay

## 2014-03-14 ENCOUNTER — Other Ambulatory Visit (HOSPITAL_BASED_OUTPATIENT_CLINIC_OR_DEPARTMENT_OTHER): Payer: Commercial Managed Care - HMO

## 2014-03-14 ENCOUNTER — Ambulatory Visit (HOSPITAL_BASED_OUTPATIENT_CLINIC_OR_DEPARTMENT_OTHER): Payer: Commercial Managed Care - HMO | Admitting: Nurse Practitioner

## 2014-03-14 ENCOUNTER — Telehealth: Payer: Self-pay | Admitting: Dietician

## 2014-03-14 VITALS — BP 114/65 | HR 109 | Temp 97.9°F | Resp 17 | Ht 63.0 in | Wt 125.8 lb

## 2014-03-14 DIAGNOSIS — R5383 Other fatigue: Secondary | ICD-10-CM

## 2014-03-14 DIAGNOSIS — C3490 Malignant neoplasm of unspecified part of unspecified bronchus or lung: Secondary | ICD-10-CM

## 2014-03-14 DIAGNOSIS — R0989 Other specified symptoms and signs involving the circulatory and respiratory systems: Secondary | ICD-10-CM

## 2014-03-14 DIAGNOSIS — Z23 Encounter for immunization: Secondary | ICD-10-CM

## 2014-03-14 DIAGNOSIS — R5381 Other malaise: Secondary | ICD-10-CM

## 2014-03-14 DIAGNOSIS — C3430 Malignant neoplasm of lower lobe, unspecified bronchus or lung: Secondary | ICD-10-CM

## 2014-03-14 DIAGNOSIS — R0609 Other forms of dyspnea: Secondary | ICD-10-CM

## 2014-03-14 DIAGNOSIS — G8929 Other chronic pain: Secondary | ICD-10-CM

## 2014-03-14 DIAGNOSIS — C801 Malignant (primary) neoplasm, unspecified: Secondary | ICD-10-CM

## 2014-03-14 DIAGNOSIS — R110 Nausea: Secondary | ICD-10-CM

## 2014-03-14 DIAGNOSIS — R530 Neoplastic (malignant) related fatigue: Secondary | ICD-10-CM

## 2014-03-14 DIAGNOSIS — R0600 Dyspnea, unspecified: Secondary | ICD-10-CM

## 2014-03-14 DIAGNOSIS — Z5111 Encounter for antineoplastic chemotherapy: Secondary | ICD-10-CM

## 2014-03-14 LAB — TECHNOLOGIST REVIEW

## 2014-03-14 LAB — COMPREHENSIVE METABOLIC PANEL (CC13)
ANION GAP: 14 meq/L — AB (ref 3–11)
AST: 11 U/L (ref 5–34)
Albumin: 3.9 g/dL (ref 3.5–5.0)
Alkaline Phosphatase: 72 U/L (ref 40–150)
BUN: 15.5 mg/dL (ref 7.0–26.0)
CALCIUM: 9.9 mg/dL (ref 8.4–10.4)
CHLORIDE: 106 meq/L (ref 98–109)
CO2: 21 meq/L — AB (ref 22–29)
Creatinine: 0.8 mg/dL (ref 0.6–1.1)
Glucose: 90 mg/dl (ref 70–140)
Potassium: 4.1 mEq/L (ref 3.5–5.1)
Sodium: 140 mEq/L (ref 136–145)
Total Bilirubin: 0.28 mg/dL (ref 0.20–1.20)
Total Protein: 7.3 g/dL (ref 6.4–8.3)

## 2014-03-14 LAB — CBC WITH DIFFERENTIAL/PLATELET
BASO%: 0.2 % (ref 0.0–2.0)
BASOS ABS: 0 10*3/uL (ref 0.0–0.1)
EOS%: 0 % (ref 0.0–7.0)
Eosinophils Absolute: 0 10*3/uL (ref 0.0–0.5)
HCT: 39.6 % (ref 34.8–46.6)
HEMOGLOBIN: 13.1 g/dL (ref 11.6–15.9)
LYMPH#: 0.9 10*3/uL (ref 0.9–3.3)
LYMPH%: 9.4 % — AB (ref 14.0–49.7)
MCH: 31 pg (ref 25.1–34.0)
MCHC: 33.1 g/dL (ref 31.5–36.0)
MCV: 93.6 fL (ref 79.5–101.0)
MONO#: 0.6 10*3/uL (ref 0.1–0.9)
MONO%: 6.1 % (ref 0.0–14.0)
NEUT#: 8.2 10*3/uL — ABNORMAL HIGH (ref 1.5–6.5)
NEUT%: 84.3 % — ABNORMAL HIGH (ref 38.4–76.8)
PLATELETS: 403 10*3/uL — AB (ref 145–400)
RBC: 4.23 10*6/uL (ref 3.70–5.45)
RDW: 15.5 % — ABNORMAL HIGH (ref 11.2–14.5)
WBC: 9.8 10*3/uL (ref 3.9–10.3)

## 2014-03-14 MED ORDER — ONDANSETRON 16 MG/50ML IVPB (CHCC)
16.0000 mg | Freq: Once | INTRAVENOUS | Status: AC
  Administered 2014-03-14: 16 mg via INTRAVENOUS

## 2014-03-14 MED ORDER — SODIUM CHLORIDE 0.9 % IV SOLN
500.0000 mg/m2 | Freq: Once | INTRAVENOUS | Status: AC
  Administered 2014-03-14: 825 mg via INTRAVENOUS
  Filled 2014-03-14: qty 33

## 2014-03-14 MED ORDER — HEPARIN SOD (PORK) LOCK FLUSH 100 UNIT/ML IV SOLN
500.0000 [IU] | Freq: Once | INTRAVENOUS | Status: AC | PRN
  Administered 2014-03-14: 500 [IU]
  Filled 2014-03-14: qty 5

## 2014-03-14 MED ORDER — SODIUM CHLORIDE 0.9 % IJ SOLN
10.0000 mL | INTRAMUSCULAR | Status: DC | PRN
  Administered 2014-03-14: 10 mL
  Filled 2014-03-14: qty 10

## 2014-03-14 MED ORDER — INFLUENZA VAC SPLIT QUAD 0.5 ML IM SUSY
0.5000 mL | PREFILLED_SYRINGE | Freq: Once | INTRAMUSCULAR | Status: AC
  Administered 2014-03-14: 0.5 mL via INTRAMUSCULAR
  Filled 2014-03-14: qty 0.5

## 2014-03-14 MED ORDER — SODIUM CHLORIDE 0.9 % IV SOLN
Freq: Once | INTRAVENOUS | Status: AC
  Administered 2014-03-14: 15:00:00 via INTRAVENOUS

## 2014-03-14 MED ORDER — SODIUM CHLORIDE 0.9 % IV SOLN
390.0000 mg | Freq: Once | INTRAVENOUS | Status: AC
  Administered 2014-03-14: 390 mg via INTRAVENOUS
  Filled 2014-03-14: qty 39

## 2014-03-14 MED ORDER — ONDANSETRON 16 MG/50ML IVPB (CHCC)
INTRAVENOUS | Status: AC
  Filled 2014-03-14: qty 16

## 2014-03-14 MED ORDER — CYANOCOBALAMIN 1000 MCG/ML IJ SOLN
INTRAMUSCULAR | Status: AC
  Filled 2014-03-14: qty 1

## 2014-03-14 MED ORDER — CYANOCOBALAMIN 1000 MCG/ML IJ SOLN
1000.0000 ug | Freq: Once | INTRAMUSCULAR | Status: AC
  Administered 2014-03-14: 1000 ug via INTRAMUSCULAR

## 2014-03-14 MED ORDER — DEXAMETHASONE SODIUM PHOSPHATE 20 MG/5ML IJ SOLN
20.0000 mg | Freq: Once | INTRAMUSCULAR | Status: AC
  Administered 2014-03-14: 20 mg via INTRAVENOUS

## 2014-03-14 MED ORDER — DEXAMETHASONE SODIUM PHOSPHATE 20 MG/5ML IJ SOLN
INTRAMUSCULAR | Status: AC
  Filled 2014-03-14: qty 5

## 2014-03-14 NOTE — Telephone Encounter (Signed)
Pt confirmed labs/ov sending msg to MD for apt sch due to no availability during the time of labs/chemo, per 09/16 POF, gave pt AVS and will p/u updated copy on 09/23 next visit, sent msg to add chemo...KJ

## 2014-03-14 NOTE — Patient Instructions (Addendum)
[facility] Discharge Instructions for Patients Receiving Chemotherapy  Today you received the following chemotherapy agent: Alimta/Carboplatin. You also received your flu shot as well.  To help prevent nausea and vomiting after your treatment, we encourage you to take your nausea medication as directed.    If you develop nausea and vomiting that is not controlled by your nausea medication, call the clinic.   BELOW ARE SYMPTOMS THAT SHOULD BE REPORTED IMMEDIATELY:  *FEVER GREATER THAN 100.5 F  *CHILLS WITH OR WITHOUT FEVER  NAUSEA AND VOMITING THAT IS NOT CONTROLLED WITH YOUR NAUSEA MEDICATION  *UNUSUAL SHORTNESS OF BREATH  *UNUSUAL BRUISING OR BLEEDING  TENDERNESS IN MOUTH AND THROAT WITH OR WITHOUT PRESENCE OF ULCERS  *URINARY PROBLEMS  *BOWEL PROBLEMS  UNUSUAL RASH Items with * indicate a potential emergency and should be followed up as soon as possible.  Feel free to call the clinic you have any questions or concerns. The clinic phone number is [phone].

## 2014-03-14 NOTE — Telephone Encounter (Signed)
Called patient to remind her that Boost samples and coupons are available for pick up.  Patient was not available.  Message left with the [facility]'s number.  Antonieta Iba, RD, LDN

## 2014-03-15 ENCOUNTER — Telehealth: Payer: Self-pay | Admitting: Medical Oncology

## 2014-03-15 ENCOUNTER — Telehealth: Payer: Self-pay | Admitting: Internal Medicine

## 2014-03-15 NOTE — Telephone Encounter (Signed)
Maria Lucero and advised on Sept and OCT appt.Marland KitchenMarland KitchenMarland KitchenMarland Kitchenpt ok and aware

## 2014-03-16 NOTE — Telephone Encounter (Signed)
Went over schedule with pt.

## 2014-03-19 ENCOUNTER — Telehealth: Payer: Self-pay | Admitting: Internal Medicine

## 2014-03-19 ENCOUNTER — Telehealth: Payer: Self-pay | Admitting: *Deleted

## 2014-03-19 NOTE — Telephone Encounter (Signed)
Per staff message and POF I have scheduled appts. Advised scheduler of appts. JMW  

## 2014-03-19 NOTE — Telephone Encounter (Addendum)
Per MD, lft msg add MD to pt schedule moved time due to no est pt availability for 10/07, mailed out updated sch...KJ

## 2014-03-19 NOTE — Telephone Encounter (Signed)
pt called to r/s lab time..done....pt awareof new time

## 2014-03-21 ENCOUNTER — Other Ambulatory Visit: Payer: Commercial Managed Care - HMO

## 2014-03-21 ENCOUNTER — Other Ambulatory Visit (HOSPITAL_BASED_OUTPATIENT_CLINIC_OR_DEPARTMENT_OTHER): Payer: Commercial Managed Care - HMO

## 2014-03-21 ENCOUNTER — Encounter: Payer: Self-pay | Admitting: Nurse Practitioner

## 2014-03-21 DIAGNOSIS — R5383 Other fatigue: Secondary | ICD-10-CM | POA: Insufficient documentation

## 2014-03-21 DIAGNOSIS — C3430 Malignant neoplasm of lower lobe, unspecified bronchus or lung: Secondary | ICD-10-CM

## 2014-03-21 DIAGNOSIS — C3490 Malignant neoplasm of unspecified part of unspecified bronchus or lung: Secondary | ICD-10-CM

## 2014-03-21 DIAGNOSIS — R110 Nausea: Secondary | ICD-10-CM | POA: Insufficient documentation

## 2014-03-21 DIAGNOSIS — R0600 Dyspnea, unspecified: Secondary | ICD-10-CM | POA: Insufficient documentation

## 2014-03-21 LAB — COMPREHENSIVE METABOLIC PANEL (CC13)
ALK PHOS: 73 U/L (ref 40–150)
AST: 10 U/L (ref 5–34)
Albumin: 3.5 g/dL (ref 3.5–5.0)
Anion Gap: 9 mEq/L (ref 3–11)
BILIRUBIN TOTAL: 0.48 mg/dL (ref 0.20–1.20)
BUN: 14.6 mg/dL (ref 7.0–26.0)
CO2: 26 mEq/L (ref 22–29)
CREATININE: 0.7 mg/dL (ref 0.6–1.1)
Calcium: 9.7 mg/dL (ref 8.4–10.4)
Chloride: 105 mEq/L (ref 98–109)
Glucose: 91 mg/dl (ref 70–140)
Potassium: 4.3 mEq/L (ref 3.5–5.1)
Sodium: 140 mEq/L (ref 136–145)
Total Protein: 6.9 g/dL (ref 6.4–8.3)

## 2014-03-21 LAB — CBC WITH DIFFERENTIAL/PLATELET
BASO%: 1 % (ref 0.0–2.0)
BASOS ABS: 0 10*3/uL (ref 0.0–0.1)
EOS%: 1.6 % (ref 0.0–7.0)
Eosinophils Absolute: 0.1 10*3/uL (ref 0.0–0.5)
HEMATOCRIT: 34.6 % — AB (ref 34.8–46.6)
HEMOGLOBIN: 11.5 g/dL — AB (ref 11.6–15.9)
LYMPH%: 23.6 % (ref 14.0–49.7)
MCH: 31.3 pg (ref 25.1–34.0)
MCHC: 33.1 g/dL (ref 31.5–36.0)
MCV: 94.6 fL (ref 79.5–101.0)
MONO#: 0.2 10*3/uL (ref 0.1–0.9)
MONO%: 5.4 % (ref 0.0–14.0)
NEUT#: 3 10*3/uL (ref 1.5–6.5)
NEUT%: 68.4 % (ref 38.4–76.8)
PLATELETS: 175 10*3/uL (ref 145–400)
RBC: 3.66 10*6/uL — ABNORMAL LOW (ref 3.70–5.45)
RDW: 15.4 % — ABNORMAL HIGH (ref 11.2–14.5)
WBC: 4.4 10*3/uL (ref 3.9–10.3)
lymph#: 1 10*3/uL (ref 0.9–3.3)

## 2014-03-21 NOTE — Assessment & Plan Note (Signed)
Patient continues to complain of some chronic mid to low back pain.  She takes Percocet for this on an as-needed basis.

## 2014-03-21 NOTE — Progress Notes (Signed)
[facility]   Chief Complaint  Patient presents with  . Follow-up    HPI: Maria Lucero 63 y.o. female diagnosed with lung cancer.  Currently undergoing carboplatin/Alimta chemotherapy regimen.  Patient presents today for cycle 3 of her carboplatin/Alimta chemotherapy regimen.  I patient is complaining of chronic fatigue; but states that otherwise as she is tolerating chemotherapy fairly well.  I she also continues to suffer with some chronic lower back pain; but states that she does not need a refill of her Percocet at this time.  She experiences some intermittent mild nausea on occasion; but states that her antitheft nausea medications sure he has a home is managing her nausea.  She continues on 2 L nasal cannula on a regular basis for her chronic dyspnea related to history of COPD and her diagnosis of lung cancer.  Patient denies any recent fevers or chills.   HPI  CURRENT THERAPY: Upcoming Treatment Dates - LUNG Pemetrexed (Alimta) / Carboplatin q21d Days with orders from any treatment category:  03/28/2014      [facility] ONC SCHEDULING COMMUNICATION      ondansetron (ZOFRAN) IVPB 16 mg      Dexamethasone Sodium Phosphate (DECADRON) injection 20 mg      PEMEtrexed (ALIMTA) 825 mg in sodium chloride 0.9 % 100 mL chemo infusion      CARBOplatin (PARAPLATIN) in sodium chloride 0.9 % 100 mL chemo infusion      sodium chloride 0.9 % injection 10 mL      heparin lock flush 100 unit/mL      heparin lock flush 100 unit/mL      alteplase (CATHFLO ACTIVASE) injection 2 mg      sodium chloride 0.9 % injection 3 mL      0.9 %  sodium chloride infusion      TREATMENT CONDITIONS 04/18/2014      [facility] ONC SCHEDULING COMMUNICATION      ondansetron (ZOFRAN) IVPB 16 mg      Dexamethasone Sodium Phosphate (DECADRON) injection 20 mg      PEMEtrexed (ALIMTA) 825 mg in sodium chloride 0.9 % 100 mL chemo infusion      CARBOplatin (PARAPLATIN) in sodium chloride 0.9 % 100 mL chemo  infusion      sodium chloride 0.9 % injection 10 mL      heparin lock flush 100 unit/mL      heparin lock flush 100 unit/mL      alteplase (CATHFLO ACTIVASE) injection 2 mg      sodium chloride 0.9 % injection 3 mL      0.9 %  sodium chloride infusion      TREATMENT CONDITIONS 05/09/2014      [facility] ONC SCHEDULING COMMUNICATION      ondansetron (ZOFRAN) IVPB 16 mg      Dexamethasone Sodium Phosphate (DECADRON) injection 20 mg      PEMEtrexed (ALIMTA) 825 mg in sodium chloride 0.9 % 100 mL chemo infusion      CARBOplatin (PARAPLATIN) in sodium chloride 0.9 % 100 mL chemo infusion      sodium chloride 0.9 % injection 10 mL      heparin lock flush 100 unit/mL      heparin lock flush 100 unit/mL      alteplase (CATHFLO ACTIVASE) injection 2 mg      sodium chloride 0.9 % injection 3 mL      cyanocobalamin ((VITAMIN B-12)) injection 1,000 mcg      0.9 %  sodium chloride  infusion      TREATMENT CONDITIONS    ROS  Past Medical History  Diagnosis Date  . Migraine headache   . Anxiety   . Urticaria   . Low back pain syndrome   . Hyperlipidemia   . Hypercholesterolemia   . Depression   . Tobacco abuse   . Myalgia and myositis, unspecified   . Bipolar 1 disorder   . Allergy   . Asthma     per pt stated  . Blood transfusion     several x  . Cataract     recent surgery b/l  . Arthritis   . History of radiation therapy 04/11/12; 04/13/12; 04/15/12; 04/18/12; & 04/20/12    Rll lung 60Gy/5/fx  . GERD (gastroesophageal reflux disease)   . COPD (chronic obstructive pulmonary disease)     home o2 at 2l/min  . Shortness of breath     home o2 at 2l/min  . lung ca dx'd 11/2009    chemo comp 05/2010,home o2 at 2l/min  . Lung cancer     invasive left upper lobe adenocarcinoma  . Complication of anesthesia     low oxygen  . PONV (postoperative nausea and vomiting)   . Fracture of humerus, proximal, left, closed 06/16/2013    Past Surgical History  Procedure Laterality Date  . Back  surgery      x 4 L3-S1 1996,2000, 2002  . Vesicovaginal fistula closure w/ tah    . Appendectomy    . Bronchoscopy  2011  . Lung biopsy  2011    needle  . Tonsillectomy    . Abdominal hysterectomy      with b/l oophoorectomy age 5  . Lumbar fusion    . Orif humerus fracture Left 06/16/2013    Procedure: OPEN REDUCTION INTERNAL FIXATION (ORIF) LEFT SHOULDER;  Surgeon: Johnny Bridge, MD;  Location: [facility];  Service: Orthopedics;  Laterality: Left;    has HYPERCHOLESTEROLEMIA; HYPERLIPIDEMIA; DEPRESSION, MAJOR, RECURRENT; ANXIETY; MIGRAINE HEADACHE; COPD with exacerbation; URTICARIA; LOW BACK PAIN SYNDROME; UNSPECIFIED MYALGIA AND MYOSITIS; HEMOPTYSIS UNSPECIFIED; MASS, LUNG; ADENOCARCINOMA, LEFT LUNG; INSOMNIA; Fracture of humerus, proximal, left, closed; Fracture, humerus, proximal; Acute exacerbation of chronic obstructive pulmonary disease (COPD); History of shoulder surgery; Chronic pain; Chronic nausea; Acute respiratory failure with hypercapnia; RLL pneumonia; HTN (hypertension); Fatigue; Dyspnea; and Nausea alone on her problem list.     is allergic to methocarbamol; morphine sulfate er beads; aspirin; avinza; clarithromycin; codeine; lyrica; phenergan; and levaquin.    Medication List       This list is accurate as of: 03/14/14 11:59 PM.  Always use your most recent med list.               albuterol (2.5 MG/3ML) 0.083% nebulizer solution  Commonly known as:  PROVENTIL  Take 2.5 mg by nebulization every 6 (six) hours as needed for wheezing.     clonazePAM 1 MG tablet  Commonly known as:  KLONOPIN  Take 1 mg by mouth at bedtime.     clorazepate 3.75 MG tablet  Commonly known as:  TRANXENE  Take 3.75 mg by mouth 3 (three) times daily as needed for anxiety.     CRESTOR 5 MG tablet  Generic drug:  rosuvastatin  Take 5 mg by mouth daily.     dexamethasone 4 MG tablet  Commonly known as:  DECADRON  4 Milligram by mouth twice a day the day before, day of and day after  the chemotherapy every three-week.  divalproex 250 MG DR tablet  Commonly known as:  DEPAKOTE  Take 750 mg by mouth at bedtime.     folic acid 1 MG tablet  Commonly known as:  FOLVITE  Take 1 tablet (1 mg total) by mouth daily.     hydroxychloroquine 200 MG tablet  Commonly known as:  PLAQUENIL  Take 200 mg by mouth 2 (two) times daily.     levothyroxine 25 MCG tablet  Commonly known as:  SYNTHROID, LEVOTHROID  Take 12.5 mcg by mouth daily before breakfast.     oxyCODONE-acetaminophen 10-325 MG per tablet  Commonly known as:  PERCOCET  Take 1-2 tablets by mouth every 6 (six) hours as needed for pain.     prochlorperazine 10 MG tablet  Commonly known as:  COMPAZINE  Take 1 tablet (10 mg total) by mouth every 6 (six) hours as needed for nausea or vomiting.     triamcinolone cream 0.1 %  Commonly known as:  KENALOG  Apply 1 application topically 2 (two) times daily as needed (for itching).         PHYSICAL EXAMINATION  Blood pressure 114/65, pulse 109, temperature 97.9 F (36.6 C), temperature source Oral, resp. rate 17, height 5\' 3"  (1.6 m), weight 125 lb 12.8 oz (57.063 kg), SpO2 97.00%.  Physical Exam  Nursing note and vitals reviewed. Constitutional: She is oriented to person, place, and time. Vital signs are normal. She appears unhealthy.  HENT:  Head: Atraumatic.  Mouth/Throat: Oropharynx is clear and moist. No oropharyngeal exudate.  Eyes: Conjunctivae are normal. Pupils are equal, round, and reactive to light. No scleral icterus.  Neck: Normal range of motion. Neck supple. No tracheal deviation present. No thyromegaly present.  Cardiovascular: Normal rate, regular rhythm, normal heart sounds and intact distal pulses.  Exam reveals no friction rub.   No murmur heard. Pulmonary/Chest: Effort normal and breath sounds normal. No respiratory distress.  O2 per nasal cannula at 2 L  Abdominal: Soft. Bowel sounds are normal. She exhibits no distension. There is no  tenderness. There is no rebound and no guarding.  Musculoskeletal: Normal range of motion. She exhibits no edema and no tenderness.  Lymphadenopathy:    She has no cervical adenopathy.  Neurological: She is alert and oriented to person, place, and time.  Skin: Skin is warm and dry. No rash noted. No erythema.  Psychiatric: Affect normal.    LABORATORY DATA:. CBC  Lab Results  Component Value Date   WBC 9.8 03/14/2014   RBC 4.23 03/14/2014   HGB 13.1 03/14/2014   HCT 39.6 03/14/2014   PLT 403* 03/14/2014   MCV 93.6 03/14/2014   MCH 31.0 03/14/2014   MCHC 33.1 03/14/2014   RDW 15.5* 03/14/2014   LYMPHSABS 0.9 03/14/2014   MONOABS 0.6 03/14/2014   EOSABS 0.0 03/14/2014   BASOSABS 0.0 03/14/2014     CMET  Lab Results  Component Value Date   NA 140 03/14/2014   K 4.1 03/14/2014   CL 101 06/28/2013   CO2 21* 03/14/2014   GLUCOSE 90 03/14/2014   BUN 15.5 03/14/2014   CREATININE 0.8 03/14/2014   CALCIUM 9.9 03/14/2014   PROT 7.3 03/14/2014   ALBUMIN 3.9 03/14/2014   AST 11 03/14/2014   ALT <6 03/14/2014   ALKPHOS 72 03/14/2014   BILITOT 0.28 03/14/2014   GFRNONAA >90 06/28/2013   GFRAA >90 06/28/2013    ASSESSMENT/PLAN:    ADENOCARCINOMA, LEFT LUNG  Assessment & Plan Patient continues to complain of some chronic  fatigue; is otherwise tolerating her chemotherapy fairly well.  Reviewed all of today's lab results with both patient and her husband in detail today.  Patient will proceed today with cycle 3 of her carboplatin/Alimta chemotherapy regimen as previously directed.  She'll return for a lab draw only on 03/21/2014.  She'll be due for labs and a restaging chest CT with contrast on 03/28/2014.  She'll be due for next cycle of chemotherapy on 04/04/2014.Marland Kitchen   Chronic pain  Assessment & Plan Patient continues to complain of some chronic mid to low back pain.  She takes Percocet for this on an as-needed basis.   Fatigue  Assessment & Plan Patient continues to complain of some  chronic  fatigue.  Most likely, this is secondary to both her cancer and a chemotherapy side effect.  Patient was encouraged to remain as active as possible.  May want to consider a physical therapy referral due to probable deconditioning.   Dyspnea  Assessment & Plan Patient also suffers with chronic dyspnea.  She has a history of chronic COPD; and remains on 2 L nasal cannula is her baseline.  She feels that her shortness of breath is stable at present.   Nausea alone  Assessment & Plan Patient is complaining of some mild, intermittent nausea as well.  She states she R. he has anti-nausea medications at home that she uses on an as-needed basis.   Patient stated understanding of all instructions; and was in agreement with this plan of care. The patient knows to call the clinic with any problems, questions or concerns.   This was a shared visit with Dr. Julien Nordmann today.  Total time spent with patient was 25 minutes;  with greater than 75 percent of that time spent in face to face counseling regarding her symptoms, and coordination of care and follow up.  Disclaimer: This note was dictated with voice recognition software. Similar sounding words can inadvertently be transcribed and may not be corrected upon review.   Drue Second, NP 03/21/2014   ADDENDUM: Hematology/Oncology Attending:  I had a face to face encounter with the patient. I recommended her care plan. This is a very pleasant 63 years old white female with recurrent non-small cell lung cancer currently undergoing systemic chemotherapy with carboplatin and Alimta status post 2 cycles. She is tolerating her treatment well except for fatigue that lasts for at least one week after the treatment. The patient also continues to have shortness of breath at baseline and increased with exertion secondary to COPD. I recommended for her to proceed with cycle #3 of her chemotherapy today as scheduled. The patient would come back for followup visit in  3 weeks for reevaluation after repeating CT scan of the chest for restaging of her disease. She was advised to call immediately if she has any concerning symptoms in the interval.  Disclaimer: This note was dictated with voice recognition software. Similar sounding words can inadvertently be transcribed and may be missed upon review. Eilleen Kempf., MD 03/21/2014

## 2014-03-21 NOTE — Assessment & Plan Note (Signed)
Patient is complaining of some mild, intermittent nausea as well.  She states she R. he has anti-nausea medications at home that she uses on an as-needed basis.

## 2014-03-21 NOTE — Assessment & Plan Note (Signed)
Patient continues to complain of some  chronic fatigue.  Most likely, this is secondary to both her cancer and a chemotherapy side effect.  Patient was encouraged to remain as active as possible.  May want to consider a physical therapy referral due to probable deconditioning.

## 2014-03-21 NOTE — Assessment & Plan Note (Signed)
Patient also suffers with chronic dyspnea.  She has a history of chronic COPD; and remains on 2 L nasal cannula is her baseline.  She feels that her shortness of breath is stable at present.

## 2014-03-21 NOTE — Assessment & Plan Note (Signed)
Patient continues to complain of some chronic fatigue; is otherwise tolerating her chemotherapy fairly well.  Reviewed all of today's lab results with both patient and her husband in detail today.  Patient will proceed today with cycle 3 of her carboplatin/Alimta chemotherapy regimen as previously directed.  She'll return for a lab draw only on 03/21/2014.  She'll be due for labs and a restaging chest CT with contrast on 03/28/2014.  She'll be due for next cycle of chemotherapy on 04/04/2014.Marland Kitchen

## 2014-03-28 ENCOUNTER — Ambulatory Visit (HOSPITAL_COMMUNITY)
Admission: RE | Admit: 2014-03-28 | Discharge: 2014-03-28 | Disposition: A | Discharge status: Home / Self Care | Payer: Medicare HMO | Source: Ambulatory Visit | Attending: Internal Medicine | Admitting: Internal Medicine

## 2014-03-28 ENCOUNTER — Ambulatory Visit: Payer: Commercial Managed Care - HMO

## 2014-03-28 ENCOUNTER — Other Ambulatory Visit (HOSPITAL_BASED_OUTPATIENT_CLINIC_OR_DEPARTMENT_OTHER): Payer: Commercial Managed Care - HMO

## 2014-03-28 DIAGNOSIS — C3490 Malignant neoplasm of unspecified part of unspecified bronchus or lung: Secondary | ICD-10-CM

## 2014-03-28 DIAGNOSIS — C3430 Malignant neoplasm of lower lobe, unspecified bronchus or lung: Secondary | ICD-10-CM

## 2014-03-28 DIAGNOSIS — R918 Other nonspecific abnormal finding of lung field: Secondary | ICD-10-CM | POA: Insufficient documentation

## 2014-03-28 LAB — CBC WITH DIFFERENTIAL/PLATELET
BASO%: 0.4 % (ref 0.0–2.0)
BASOS ABS: 0 10*3/uL (ref 0.0–0.1)
EOS%: 1.2 % (ref 0.0–7.0)
Eosinophils Absolute: 0.1 10*3/uL (ref 0.0–0.5)
HEMATOCRIT: 34 % — AB (ref 34.8–46.6)
HGB: 11.3 g/dL — ABNORMAL LOW (ref 11.6–15.9)
LYMPH%: 29.4 % (ref 14.0–49.7)
MCH: 31.5 pg (ref 25.1–34.0)
MCHC: 33.2 g/dL (ref 31.5–36.0)
MCV: 94.8 fL (ref 79.5–101.0)
MONO#: 0.4 10*3/uL (ref 0.1–0.9)
MONO%: 8.9 % (ref 0.0–14.0)
NEUT#: 2.6 10*3/uL (ref 1.5–6.5)
NEUT%: 60.1 % (ref 38.4–76.8)
Platelets: 262 10*3/uL (ref 145–400)
RBC: 3.59 10*6/uL — ABNORMAL LOW (ref 3.70–5.45)
RDW: 15.2 % — AB (ref 11.2–14.5)
WBC: 4.3 10*3/uL (ref 3.9–10.3)
lymph#: 1.3 10*3/uL (ref 0.9–3.3)

## 2014-03-28 LAB — COMPREHENSIVE METABOLIC PANEL (CC13)
ALT: 11 U/L (ref 0–55)
AST: 16 U/L (ref 5–34)
Albumin: 3.7 g/dL (ref 3.5–5.0)
Alkaline Phosphatase: 70 U/L (ref 40–150)
Anion Gap: 11 mEq/L (ref 3–11)
BUN: 11.9 mg/dL (ref 7.0–26.0)
CALCIUM: 9.9 mg/dL (ref 8.4–10.4)
CHLORIDE: 105 meq/L (ref 98–109)
CO2: 26 mEq/L (ref 22–29)
CREATININE: 0.7 mg/dL (ref 0.6–1.1)
GLUCOSE: 90 mg/dL (ref 70–140)
POTASSIUM: 4.1 meq/L (ref 3.5–5.1)
Sodium: 141 mEq/L (ref 136–145)
Total Bilirubin: 0.34 mg/dL (ref 0.20–1.20)
Total Protein: 7.1 g/dL (ref 6.4–8.3)

## 2014-03-28 MED ORDER — IOHEXOL 300 MG/ML  SOLN
80.0000 mL | Freq: Once | INTRAMUSCULAR | Status: AC | PRN
  Administered 2014-03-28: 80 mL via INTRAVENOUS

## 2014-04-04 ENCOUNTER — Ambulatory Visit: Payer: Commercial Managed Care - HMO

## 2014-04-04 ENCOUNTER — Ambulatory Visit: Payer: Commercial Managed Care - HMO | Admitting: Internal Medicine

## 2014-04-04 ENCOUNTER — Ambulatory Visit (HOSPITAL_COMMUNITY): Payer: Medicare HMO

## 2014-04-04 ENCOUNTER — Ambulatory Visit (HOSPITAL_BASED_OUTPATIENT_CLINIC_OR_DEPARTMENT_OTHER): Payer: Commercial Managed Care - HMO | Admitting: Internal Medicine

## 2014-04-04 ENCOUNTER — Other Ambulatory Visit: Payer: Commercial Managed Care - HMO

## 2014-04-04 ENCOUNTER — Ambulatory Visit (HOSPITAL_BASED_OUTPATIENT_CLINIC_OR_DEPARTMENT_OTHER): Payer: Commercial Managed Care - HMO

## 2014-04-04 ENCOUNTER — Other Ambulatory Visit (HOSPITAL_BASED_OUTPATIENT_CLINIC_OR_DEPARTMENT_OTHER): Payer: Commercial Managed Care - HMO

## 2014-04-04 ENCOUNTER — Telehealth: Payer: Self-pay | Admitting: Internal Medicine

## 2014-04-04 ENCOUNTER — Encounter: Payer: Self-pay | Admitting: Internal Medicine

## 2014-04-04 ENCOUNTER — Ambulatory Visit: Payer: Commercial Managed Care - HMO | Admitting: Physician Assistant

## 2014-04-04 VITALS — BP 120/56 | HR 98 | Temp 98.1°F | Resp 17 | Ht 63.0 in | Wt 127.8 lb

## 2014-04-04 DIAGNOSIS — C3490 Malignant neoplasm of unspecified part of unspecified bronchus or lung: Secondary | ICD-10-CM

## 2014-04-04 DIAGNOSIS — Z5111 Encounter for antineoplastic chemotherapy: Secondary | ICD-10-CM

## 2014-04-04 DIAGNOSIS — C3431 Malignant neoplasm of lower lobe, right bronchus or lung: Secondary | ICD-10-CM

## 2014-04-04 DIAGNOSIS — J449 Chronic obstructive pulmonary disease, unspecified: Secondary | ICD-10-CM

## 2014-04-04 LAB — CBC WITH DIFFERENTIAL/PLATELET
BASO%: 0.3 % (ref 0.0–2.0)
Basophils Absolute: 0 10*3/uL (ref 0.0–0.1)
EOS%: 0.4 % (ref 0.0–7.0)
Eosinophils Absolute: 0 10*3/uL (ref 0.0–0.5)
HCT: 33.3 % — ABNORMAL LOW (ref 34.8–46.6)
HGB: 11 g/dL — ABNORMAL LOW (ref 11.6–15.9)
LYMPH#: 1 10*3/uL (ref 0.9–3.3)
LYMPH%: 13.6 % — ABNORMAL LOW (ref 14.0–49.7)
MCH: 31.8 pg (ref 25.1–34.0)
MCHC: 33 g/dL (ref 31.5–36.0)
MCV: 96.3 fL (ref 79.5–101.0)
MONO#: 0.8 10*3/uL (ref 0.1–0.9)
MONO%: 10.2 % (ref 0.0–14.0)
NEUT#: 5.5 10*3/uL (ref 1.5–6.5)
NEUT%: 75.5 % (ref 38.4–76.8)
Platelets: 640 10*3/uL — ABNORMAL HIGH (ref 145–400)
RBC: 3.45 10*6/uL — AB (ref 3.70–5.45)
RDW: 17.1 % — ABNORMAL HIGH (ref 11.2–14.5)
WBC: 7.3 10*3/uL (ref 3.9–10.3)

## 2014-04-04 LAB — COMPREHENSIVE METABOLIC PANEL (CC13)
ALK PHOS: 65 U/L (ref 40–150)
ALT: 7 U/L (ref 0–55)
AST: 12 U/L (ref 5–34)
Albumin: 3.7 g/dL (ref 3.5–5.0)
Anion Gap: 10 mEq/L (ref 3–11)
BUN: 15.3 mg/dL (ref 7.0–26.0)
CO2: 26 meq/L (ref 22–29)
Calcium: 10.1 mg/dL (ref 8.4–10.4)
Chloride: 104 mEq/L (ref 98–109)
Creatinine: 0.7 mg/dL (ref 0.6–1.1)
GLUCOSE: 85 mg/dL (ref 70–140)
Potassium: 3.8 mEq/L (ref 3.5–5.1)
SODIUM: 140 meq/L (ref 136–145)
TOTAL PROTEIN: 7 g/dL (ref 6.4–8.3)
Total Bilirubin: 0.31 mg/dL (ref 0.20–1.20)

## 2014-04-04 MED ORDER — DEXAMETHASONE SODIUM PHOSPHATE 20 MG/5ML IJ SOLN
INTRAMUSCULAR | Status: AC
  Filled 2014-04-04: qty 5

## 2014-04-04 MED ORDER — SODIUM CHLORIDE 0.9 % IV SOLN
389.0000 mg | Freq: Once | INTRAVENOUS | Status: AC
  Administered 2014-04-04: 390 mg via INTRAVENOUS
  Filled 2014-04-04: qty 39

## 2014-04-04 MED ORDER — SODIUM CHLORIDE 0.9 % IJ SOLN
10.0000 mL | INTRAMUSCULAR | Status: DC | PRN
  Administered 2014-04-04: 10 mL
  Filled 2014-04-04: qty 10

## 2014-04-04 MED ORDER — DEXAMETHASONE SODIUM PHOSPHATE 20 MG/5ML IJ SOLN
20.0000 mg | Freq: Once | INTRAMUSCULAR | Status: AC
  Administered 2014-04-04: 20 mg via INTRAVENOUS

## 2014-04-04 MED ORDER — SODIUM CHLORIDE 0.9 % IV SOLN
500.0000 mg/m2 | Freq: Once | INTRAVENOUS | Status: AC
  Administered 2014-04-04: 825 mg via INTRAVENOUS
  Filled 2014-04-04: qty 33

## 2014-04-04 MED ORDER — ONDANSETRON 16 MG/50ML IVPB (CHCC)
16.0000 mg | Freq: Once | INTRAVENOUS | Status: AC
  Administered 2014-04-04: 16 mg via INTRAVENOUS

## 2014-04-04 MED ORDER — HEPARIN SOD (PORK) LOCK FLUSH 100 UNIT/ML IV SOLN
500.0000 [IU] | Freq: Once | INTRAVENOUS | Status: AC | PRN
  Administered 2014-04-04: 500 [IU]
  Filled 2014-04-04: qty 5

## 2014-04-04 MED ORDER — ONDANSETRON 16 MG/50ML IVPB (CHCC)
INTRAVENOUS | Status: AC
  Filled 2014-04-04: qty 16

## 2014-04-04 MED ORDER — SODIUM CHLORIDE 0.9 % IV SOLN
Freq: Once | INTRAVENOUS | Status: AC
  Administered 2014-04-04: 12:00:00 via INTRAVENOUS

## 2014-04-04 NOTE — Patient Instructions (Signed)
[facility] Discharge Instructions for Patients Receiving Chemotherapy  Today you received the following chemotherapy agents Alimta and Carboplatin.   To help prevent nausea and vomiting after your treatment, we encourage you to take your nausea medication.   If you develop nausea and vomiting that is not controlled by your nausea medication, call the clinic.   BELOW ARE SYMPTOMS THAT SHOULD BE REPORTED IMMEDIATELY:  *FEVER GREATER THAN 100.5 F  *CHILLS WITH OR WITHOUT FEVER  NAUSEA AND VOMITING THAT IS NOT CONTROLLED WITH YOUR NAUSEA MEDICATION  *UNUSUAL SHORTNESS OF BREATH  *UNUSUAL BRUISING OR BLEEDING  TENDERNESS IN MOUTH AND THROAT WITH OR WITHOUT PRESENCE OF ULCERS  *URINARY PROBLEMS  *BOWEL PROBLEMS  UNUSUAL RASH Items with * indicate a potential emergency and should be followed up as soon as possible.  Feel free to call the clinic you have any questions or concerns. The clinic phone number is [phone].

## 2014-04-04 NOTE — Telephone Encounter (Signed)
gv and printed appt sched and avs for pt for OCT and NOV....sed added tx. °

## 2014-04-04 NOTE — Progress Notes (Signed)
[place] [phone]   Fax:(336) [place], MD [address] [place] [address]  PRINCIPAL DIAGNOSIS: Metastatic non-small cell lung cancer initially diagnosed as Unresectable stage IIB (T3 N0 M0) non-small cell lung cancer, adenocarcinoma diagnosed in June 2011.   PRIOR THERAPY:  1. Status post concurrent chemoradiation with weekly carboplatin and paclitaxel. Last dose was given February 24, 2010. 2. Consolidation chemotherapy with carboplatin and Alimta, status post 3 cycles. Last dose was given June 09, 2010. 3. Curative radiotherapy to a right lower lobe nodule under the care of Dr. Pablo Ledger completed 04/20/2012.  CURRENT THERAPY: Systemic chemotherapy with carboplatin for AUC of 5 and Alimta 500 mg/M2 every 3 weeks. First dose 01/24/2014. Status post 3 cycles.  INTERVAL HISTORY: Maria Lucero 63 y.o. female returns to the clinic today for followup visit accompanied her husband. The patient is tolerating her current treatment was carboplatin and Alimta fairly well with no significant adverse effects. The patient is feeling fine today with no specific complaints except for the baseline shortness of breath and she is currently on home oxygen. She denied having any significant chest pain, cough or hemoptysis. The patient denied having any significant weight loss or night sweats. She has no fever or chills, no nausea or vomiting. She had repeat CT scan of the chest performed recently and she is here for evaluation and discussion of her scan results.   MEDICAL HISTORY: Past Medical History  Diagnosis Date  . Migraine headache   . Anxiety   . Urticaria   . Low back pain syndrome   . Hyperlipidemia   . Hypercholesterolemia   . Depression   . Tobacco abuse   . Myalgia and myositis, unspecified   . Bipolar 1 disorder   . Allergy   . Asthma     per pt stated  . Blood transfusion     several x  .  Cataract     recent surgery b/l  . Arthritis   . History of radiation therapy 04/11/12; 04/13/12; 04/15/12; 04/18/12; & 04/20/12    Rll lung 60Gy/5/fx  . GERD (gastroesophageal reflux disease)   . COPD (chronic obstructive pulmonary disease)     home o2 at 2l/min  . Shortness of breath     home o2 at 2l/min  . lung ca dx'd 11/2009    chemo comp 05/2010,home o2 at 2l/min  . Lung cancer     invasive left upper lobe adenocarcinoma  . Complication of anesthesia     low oxygen  . PONV (postoperative nausea and vomiting)   . Fracture of humerus, proximal, left, closed 06/16/2013    ALLERGIES:  is allergic to methocarbamol; morphine sulfate er beads; aspirin; avinza; clarithromycin; codeine; lyrica; oxycontin; phenergan; and levaquin.  MEDICATIONS:  Current Outpatient Prescriptions  Medication Sig Dispense Refill  . albuterol (PROVENTIL) (2.5 MG/3ML) 0.083% nebulizer solution Take 2.5 mg by nebulization every 6 (six) hours as needed for wheezing.      . clonazePAM (KLONOPIN) 1 MG tablet Take 1 mg by mouth at bedtime.       . clorazepate (TRANXENE) 3.75 MG tablet Take 3.75 mg by mouth 3 (three) times daily as needed for anxiety.      . CRESTOR 5 MG tablet Take 5 mg by mouth daily.      Marland Kitchen dexamethasone (DECADRON) 4 MG tablet 4 Milligram by mouth twice a day the day before, day of and  day after the chemotherapy every three-week.  40 tablet  1  . divalproex (DEPAKOTE) 250 MG DR tablet Take 750 mg by mouth at bedtime.      . folic acid (FOLVITE) 1 MG tablet Take 1 tablet (1 mg total) by mouth daily.  30 tablet  4  . hydroxychloroquine (PLAQUENIL) 200 MG tablet Take 200 mg by mouth 2 (two) times daily.       Marland Kitchen levothyroxine (SYNTHROID, LEVOTHROID) 25 MCG tablet Take 12.5 mcg by mouth daily before breakfast.      . oxyCODONE-acetaminophen (PERCOCET) 10-325 MG per tablet Take 1-2 tablets by mouth every 6 (six) hours as needed for pain.      Marland Kitchen prochlorperazine (COMPAZINE) 10 MG tablet Take 1 tablet  (10 mg total) by mouth every 6 (six) hours as needed for nausea or vomiting.  30 tablet  0  . triamcinolone cream (KENALOG) 0.1 % Apply 1 application topically 2 (two) times daily as needed (for itching).       . [DISCONTINUED] lisinopril-hydrochlorothiazide (PRINZIDE,ZESTORETIC) 10-12.5 MG per tablet Take 1 tablet by mouth.        No current facility-administered medications for this visit.    SURGICAL HISTORY:  Past Surgical History  Procedure Laterality Date  . Back surgery      x 4 L3-S1 1996,2000, 2002  . Vesicovaginal fistula closure w/ tah    . Appendectomy    . Bronchoscopy  2011  . Lung biopsy  2011    needle  . Tonsillectomy    . Abdominal hysterectomy      with b/l oophoorectomy age 50  . Lumbar fusion    . Orif humerus fracture Left 06/16/2013    Procedure: OPEN REDUCTION INTERNAL FIXATION (ORIF) LEFT SHOULDER;  Surgeon: Johnny Bridge, MD;  Location: [facility];  Service: Orthopedics;  Laterality: Left;    REVIEW OF SYSTEMS:  Constitutional: positive for anorexia and fatigue Eyes: negative Ears, nose, mouth, throat, and face: negative Respiratory: positive for cough and dyspnea on exertion Cardiovascular: negative Gastrointestinal: negative Genitourinary:negative Integument/breast: positive for rash Hematologic/lymphatic: negative Musculoskeletal:negative Neurological: negative Behavioral/Psych: negative Endocrine: negative Allergic/Immunologic: negative   PHYSICAL EXAMINATION: General appearance: alert, cooperative, fatigued and no distress Head: Normocephalic, without obvious abnormality, atraumatic Neck: no adenopathy Lymph nodes: Cervical, supraclavicular, and axillary nodes normal. Resp: clear to auscultation bilaterally Back: symmetric, no curvature. ROM normal. No CVA tenderness. Cardio: regular rate and rhythm, S1, S2 normal, no murmur, click, rub or gallop GI: soft, non-tender; bowel sounds normal; no masses,  no organomegaly Extremities: extremities  normal, atraumatic, no cyanosis or edema Neurologic: Alert and oriented X 3, normal strength and tone. Normal symmetric reflexes. Normal coordination and gait  ECOG PERFORMANCE STATUS: 2 - Symptomatic, <50% confined to bed  Blood pressure 120/56, pulse 98, temperature 98.1 F (36.7 C), temperature source Oral, resp. rate 17, height 5\' 3"  (1.6 m), weight 127 lb 12.8 oz (57.97 kg), SpO2 99.00%.  LABORATORY DATA: Lab Results  Component Value Date   WBC 7.3 04/04/2014   HGB 11.0* 04/04/2014   HCT 33.3* 04/04/2014   MCV 96.3 04/04/2014   PLT 640* 04/04/2014      Chemistry      Component Value Date/Time   NA 141 03/28/2014 1238   NA 145 06/28/2013 0443   NA 145 08/24/2011 1430   K 4.1 03/28/2014 1238   K 3.8 06/28/2013 0443   K 4.3 08/24/2011 1430   CL 101 06/28/2013 0443   CL 101 06/20/2012 1119  CL 95* 08/24/2011 1430   CO2 26 03/28/2014 1238   CO2 38* 06/28/2013 0443   CO2 31 08/24/2011 1430   BUN 11.9 03/28/2014 1238   BUN 18 06/28/2013 0443   BUN 15 08/24/2011 1430   CREATININE 0.7 03/28/2014 1238   CREATININE 0.59 06/28/2013 0443   CREATININE 0.8 08/24/2011 1430      Component Value Date/Time   CALCIUM 9.9 03/28/2014 1238   CALCIUM 7.8* 06/28/2013 0443   CALCIUM 8.6 08/24/2011 1430   ALKPHOS 70 03/28/2014 1238   ALKPHOS 71 08/24/2011 1430   ALKPHOS 98 06/28/2010 0600   AST 16 03/28/2014 1238   AST 15 08/24/2011 1430   AST 14 06/28/2010 0600   ALT 11 03/28/2014 1238   ALT 7* 08/24/2011 1430   ALT 10 06/28/2010 0600   BILITOT 0.34 03/28/2014 1238   BILITOT 0.50 08/24/2011 1430   BILITOT 0.4 06/28/2010 0600       RADIOGRAPHIC STUDIES: Ct Chest W Contrast  03/28/2014   CLINICAL DATA:  Followup lung cancer  EXAM: CT CHEST WITH CONTRAST  TECHNIQUE: Multidetector CT imaging of the chest was performed during intravenous contrast administration.  CONTRAST:  58mL OMNIPAQUE IOHEXOL 300 MG/ML  SOLN  COMPARISON:  01/12/2014  FINDINGS: Mediastinum: The heart size appears normal. There is a  small pericardial effusion. The trachea appears patent. Normal appearance of the esophagus. No enlarged mediastinal or hilar lymph node.  Lungs/Pleura: Changes of external beam radiation involves the left lung. There is significant volume loss from the left upper lobe and left midlung. There is resulting and deviation of the mediastinum towards the left. Index nodule within the posterior right lung base is decreased measuring 5 mm, image 45/series 5. Previously 7 mm. Right lower lobe peribronchovascular lesion is stable measuring 1.7 x 2.3 cm. Spiculated nodule in the superior segment of the right lower lobe measures 1 x 0.9 cm, image 31/series 5. Previously this measured 1.2 x 1.1 cm. Anterior medial subpleural nodule in the right upper lobe measures 7 mm, image 26/series 5. Stable from previous exam.  Upper Abdomen: The visualized portions of the liver, spleen, and pancreas are unremarkable. The adrenal glands both appear normal.  Musculoskeletal: Review of the visualized osseous structures is negative for aggressive lytic or sclerotic bone lesion. Postoperative change from hardware fixation is again noted involving the lower thoracic spine. Chronic left lateral rib fracture deformities are again noted.  IMPRESSION: 1. Stable appearance of the left lung status post therapy with external beam radiation. No specific features identified to suggest tumor recurrence in the left lung. 2. Multi focal pulmonary nodules in the right lung are stable to slightly improved in the interval. No new or progressive disease identified.   Electronically Signed   By: Kerby Moors M.D.   On: 03/28/2014 13:25   ASSESSMENT AND PLAN: This is a very pleasant 63 years old white female with metastatic non-small cell lung cancer initially diagnosed as unresectable stage IIB non-small cell lung cancer status post concurrent chemoradiation followed by consolidation chemotherapy. The patient had evidence for disease progression he'll  months ago and she was started on systemic chemotherapy with carboplatin and Alimta status post 3 cycles. She is tolerating her treatment fairly well with no significant adverse effects. She had repeat CT scan of the chest performed recently that showed stable appearance of the left lung as well as stable to mildly improved disease in the right lung but no evidence for new or progressive disease. I discussed the scan results  with the patient and her husband. I recommended for her to continue on the same treatment regimen was carboplatin and Alimta for 3 more cycles. She will receive cycle #4 today. She would come back for followup visit in 3 weeks with the next cycle of her treatment. The patient was advised to call immediately if she has any concerning symptoms in the interval.  All questions were answered. The patient knows to call the clinic with any problems, questions or concerns. We can certainly see the patient much sooner if necessary. The patient and her family agreed to the current plan.  Disclaimer: This note was dictated with voice recognition software. Similar sounding words can inadvertently be transcribed and may not be corrected upon review.

## 2014-04-11 ENCOUNTER — Telehealth: Payer: Self-pay | Admitting: Internal Medicine

## 2014-04-11 ENCOUNTER — Other Ambulatory Visit: Payer: Commercial Managed Care - HMO

## 2014-04-11 NOTE — Telephone Encounter (Signed)
s.w. pt and confrmed that she did not want to keep todays appt due to not felling well

## 2014-04-18 ENCOUNTER — Other Ambulatory Visit (HOSPITAL_BASED_OUTPATIENT_CLINIC_OR_DEPARTMENT_OTHER): Payer: Commercial Managed Care - HMO

## 2014-04-18 DIAGNOSIS — C3490 Malignant neoplasm of unspecified part of unspecified bronchus or lung: Secondary | ICD-10-CM

## 2014-04-18 DIAGNOSIS — C3431 Malignant neoplasm of lower lobe, right bronchus or lung: Secondary | ICD-10-CM

## 2014-04-18 LAB — COMPREHENSIVE METABOLIC PANEL (CC13)
ALT: 10 U/L (ref 0–55)
ANION GAP: 10 meq/L (ref 3–11)
AST: 13 U/L (ref 5–34)
Albumin: 3.7 g/dL (ref 3.5–5.0)
Alkaline Phosphatase: 70 U/L (ref 40–150)
BILIRUBIN TOTAL: 0.28 mg/dL (ref 0.20–1.20)
BUN: 10.3 mg/dL (ref 7.0–26.0)
CO2: 24 mEq/L (ref 22–29)
Calcium: 9.8 mg/dL (ref 8.4–10.4)
Chloride: 105 mEq/L (ref 98–109)
Creatinine: 0.8 mg/dL (ref 0.6–1.1)
Glucose: 80 mg/dl (ref 70–140)
Potassium: 4.2 mEq/L (ref 3.5–5.1)
Sodium: 140 mEq/L (ref 136–145)
Total Protein: 6.7 g/dL (ref 6.4–8.3)

## 2014-04-18 LAB — CBC WITH DIFFERENTIAL/PLATELET
BASO%: 0.2 % (ref 0.0–2.0)
BASOS ABS: 0 10*3/uL (ref 0.0–0.1)
EOS%: 1.9 % (ref 0.0–7.0)
Eosinophils Absolute: 0.1 10*3/uL (ref 0.0–0.5)
HEMATOCRIT: 31.6 % — AB (ref 34.8–46.6)
HGB: 10.5 g/dL — ABNORMAL LOW (ref 11.6–15.9)
LYMPH%: 33 % (ref 14.0–49.7)
MCH: 32 pg (ref 25.1–34.0)
MCHC: 33.2 g/dL (ref 31.5–36.0)
MCV: 96.3 fL (ref 79.5–101.0)
MONO#: 0.4 10*3/uL (ref 0.1–0.9)
MONO%: 9.5 % (ref 0.0–14.0)
NEUT#: 2.4 10*3/uL (ref 1.5–6.5)
NEUT%: 55.4 % (ref 38.4–76.8)
PLATELETS: 135 10*3/uL — AB (ref 145–400)
RBC: 3.28 10*6/uL — ABNORMAL LOW (ref 3.70–5.45)
RDW: 15.8 % — ABNORMAL HIGH (ref 11.2–14.5)
WBC: 4.3 10*3/uL (ref 3.9–10.3)
lymph#: 1.4 10*3/uL (ref 0.9–3.3)

## 2014-04-25 ENCOUNTER — Ambulatory Visit: Payer: Commercial Managed Care - HMO

## 2014-04-25 ENCOUNTER — Ambulatory Visit (HOSPITAL_BASED_OUTPATIENT_CLINIC_OR_DEPARTMENT_OTHER): Payer: Commercial Managed Care - HMO | Admitting: Physician Assistant

## 2014-04-25 ENCOUNTER — Ambulatory Visit (HOSPITAL_BASED_OUTPATIENT_CLINIC_OR_DEPARTMENT_OTHER): Payer: Commercial Managed Care - HMO

## 2014-04-25 ENCOUNTER — Telehealth: Payer: Self-pay | Admitting: Physician Assistant

## 2014-04-25 ENCOUNTER — Encounter: Payer: Self-pay | Admitting: Physician Assistant

## 2014-04-25 ENCOUNTER — Other Ambulatory Visit (HOSPITAL_BASED_OUTPATIENT_CLINIC_OR_DEPARTMENT_OTHER): Payer: Commercial Managed Care - HMO

## 2014-04-25 VITALS — BP 130/82 | HR 115 | Temp 98.8°F | Resp 17 | Ht 63.0 in | Wt 128.8 lb

## 2014-04-25 DIAGNOSIS — C3432 Malignant neoplasm of lower lobe, left bronchus or lung: Secondary | ICD-10-CM

## 2014-04-25 DIAGNOSIS — R0602 Shortness of breath: Secondary | ICD-10-CM

## 2014-04-25 DIAGNOSIS — Z5111 Encounter for antineoplastic chemotherapy: Secondary | ICD-10-CM

## 2014-04-25 DIAGNOSIS — C3431 Malignant neoplasm of lower lobe, right bronchus or lung: Secondary | ICD-10-CM

## 2014-04-25 DIAGNOSIS — C3490 Malignant neoplasm of unspecified part of unspecified bronchus or lung: Secondary | ICD-10-CM

## 2014-04-25 LAB — COMPREHENSIVE METABOLIC PANEL (CC13)
ALBUMIN: 4 g/dL (ref 3.5–5.0)
ALT: 10 U/L (ref 0–55)
ANION GAP: 15 meq/L — AB (ref 3–11)
AST: 10 U/L (ref 5–34)
Alkaline Phosphatase: 73 U/L (ref 40–150)
BUN: 15.9 mg/dL (ref 7.0–26.0)
CALCIUM: 10.1 mg/dL (ref 8.4–10.4)
CHLORIDE: 106 meq/L (ref 98–109)
CO2: 20 mEq/L — ABNORMAL LOW (ref 22–29)
Creatinine: 0.8 mg/dL (ref 0.6–1.1)
Glucose: 102 mg/dl (ref 70–140)
Potassium: 3.9 mEq/L (ref 3.5–5.1)
Sodium: 141 mEq/L (ref 136–145)
Total Bilirubin: 0.25 mg/dL (ref 0.20–1.20)
Total Protein: 7.4 g/dL (ref 6.4–8.3)

## 2014-04-25 LAB — CBC WITH DIFFERENTIAL/PLATELET
BASO%: 0 % (ref 0.0–2.0)
Basophils Absolute: 0 10*3/uL (ref 0.0–0.1)
EOS%: 0 % (ref 0.0–7.0)
Eosinophils Absolute: 0 10*3/uL (ref 0.0–0.5)
HEMATOCRIT: 35.1 % (ref 34.8–46.6)
HGB: 11.5 g/dL — ABNORMAL LOW (ref 11.6–15.9)
LYMPH%: 18.6 % (ref 14.0–49.7)
MCH: 32.5 pg (ref 25.1–34.0)
MCHC: 32.8 g/dL (ref 31.5–36.0)
MCV: 99.2 fL (ref 79.5–101.0)
MONO#: 0.2 10*3/uL (ref 0.1–0.9)
MONO%: 4.5 % (ref 0.0–14.0)
NEUT%: 76.9 % — AB (ref 38.4–76.8)
NEUTROS ABS: 3.9 10*3/uL (ref 1.5–6.5)
Platelets: 473 10*3/uL — ABNORMAL HIGH (ref 145–400)
RBC: 3.54 10*6/uL — ABNORMAL LOW (ref 3.70–5.45)
RDW: 16.8 % — AB (ref 11.2–14.5)
WBC: 5.1 10*3/uL (ref 3.9–10.3)
lymph#: 1 10*3/uL (ref 0.9–3.3)

## 2014-04-25 MED ORDER — SODIUM CHLORIDE 0.9 % IJ SOLN
10.0000 mL | INTRAMUSCULAR | Status: DC | PRN
  Administered 2014-04-25: 10 mL
  Filled 2014-04-25: qty 10

## 2014-04-25 MED ORDER — SODIUM CHLORIDE 0.9 % IV SOLN
500.0000 mg/m2 | Freq: Once | INTRAVENOUS | Status: AC
  Administered 2014-04-25: 825 mg via INTRAVENOUS
  Filled 2014-04-25: qty 33

## 2014-04-25 MED ORDER — CARBOPLATIN CHEMO INTRADERMAL TEST DOSE 100MCG/0.02ML
100.0000 ug | Freq: Once | INTRADERMAL | Status: AC
  Administered 2014-04-25: 100 ug via INTRADERMAL
  Filled 2014-04-25: qty 0.01

## 2014-04-25 MED ORDER — HEPARIN SOD (PORK) LOCK FLUSH 100 UNIT/ML IV SOLN
500.0000 [IU] | Freq: Once | INTRAVENOUS | Status: AC | PRN
  Administered 2014-04-25: 500 [IU]
  Filled 2014-04-25: qty 5

## 2014-04-25 MED ORDER — SODIUM CHLORIDE 0.9 % IV SOLN
Freq: Once | INTRAVENOUS | Status: AC
  Administered 2014-04-25: 15:00:00 via INTRAVENOUS

## 2014-04-25 MED ORDER — ONDANSETRON 16 MG/50ML IVPB (CHCC)
16.0000 mg | Freq: Once | INTRAVENOUS | Status: AC
  Administered 2014-04-25: 16 mg via INTRAVENOUS

## 2014-04-25 MED ORDER — SODIUM CHLORIDE 0.9 % IV SOLN
389.0000 mg | Freq: Once | INTRAVENOUS | Status: AC
  Administered 2014-04-25: 390 mg via INTRAVENOUS
  Filled 2014-04-25: qty 39

## 2014-04-25 MED ORDER — DEXAMETHASONE SODIUM PHOSPHATE 20 MG/5ML IJ SOLN
INTRAMUSCULAR | Status: AC
  Filled 2014-04-25: qty 5

## 2014-04-25 MED ORDER — ONDANSETRON 16 MG/50ML IVPB (CHCC)
INTRAVENOUS | Status: AC
  Filled 2014-04-25: qty 16

## 2014-04-25 MED ORDER — DEXAMETHASONE SODIUM PHOSPHATE 20 MG/5ML IJ SOLN
20.0000 mg | Freq: Once | INTRAMUSCULAR | Status: AC
  Administered 2014-04-25: 20 mg via INTRAVENOUS

## 2014-04-25 NOTE — Progress Notes (Addendum)
[facility] Telephone:([phone]   Fax:(336) [place], MD [address] [place] [address]  PRINCIPAL DIAGNOSIS: Metastatic non-small cell lung cancer initially diagnosed as Unresectable stage IIB (T3 N0 M0) non-small cell lung cancer, adenocarcinoma diagnosed in June 2011.   PRIOR THERAPY:  1. Status post concurrent chemoradiation with weekly carboplatin and paclitaxel. Last dose was given February 24, 2010. 2. Consolidation chemotherapy with carboplatin and Alimta, status post 3 cycles. Last dose was given June 09, 2010. 3. Curative radiotherapy to a right lower lobe nodule under the care of Dr. Pablo Ledger completed 04/20/2012.  CURRENT THERAPY: Systemic chemotherapy with carboplatin for AUC of 5 and Alimta 500 mg/M2 every 3 weeks. First dose 01/24/2014. Status post 4 cycles.  INTERVAL HISTORY: Maria Lucero 63 y.o. female returns to the clinic today for followup visit accompanied her husband. The patient is tolerating her current treatment was carboplatin and Alimta fairly well with no significant adverse effects. The patient is feeling fine today with no specific complaints except for the baseline shortness of breath and she is currently on home oxygen. She denied having any significant chest pain, cough or hemoptysis. The patient denied having any significant weight loss or night sweats. She was having some issues with constipation however these are well managed with senna, S. She has occasional nausea but this is significantly better as well. She has no fever or chills, no nausea or vomiting.   MEDICAL HISTORY: Past Medical History  Diagnosis Date  . Migraine headache   . Anxiety   . Urticaria   . Low back pain syndrome   . Hyperlipidemia   . Hypercholesterolemia   . Depression   . Tobacco abuse   . Myalgia and myositis, unspecified   . Bipolar 1 disorder   . Allergy   . Asthma     per pt stated  .  Blood transfusion     several x  . Cataract     recent surgery b/l  . Arthritis   . History of radiation therapy 04/11/12; 04/13/12; 04/15/12; 04/18/12; & 04/20/12    Rll lung 60Gy/5/fx  . GERD (gastroesophageal reflux disease)   . COPD (chronic obstructive pulmonary disease)     home o2 at 2l/min  . Shortness of breath     home o2 at 2l/min  . lung ca dx'd 11/2009    chemo comp 05/2010,home o2 at 2l/min  . Lung cancer     invasive left upper lobe adenocarcinoma  . Complication of anesthesia     low oxygen  . PONV (postoperative nausea and vomiting)   . Fracture of humerus, proximal, left, closed 06/16/2013    ALLERGIES:  is allergic to methocarbamol; morphine sulfate er beads; aspirin; avinza; clarithromycin; codeine; lyrica; oxycontin; phenergan; and levaquin.  MEDICATIONS:  Current Outpatient Prescriptions  Medication Sig Dispense Refill  . albuterol (PROVENTIL) (2.5 MG/3ML) 0.083% nebulizer solution Take 2.5 mg by nebulization every 6 (six) hours as needed for wheezing.      . clonazePAM (KLONOPIN) 1 MG tablet Take 1 mg by mouth at bedtime.       . clorazepate (TRANXENE) 3.75 MG tablet Take 3.75 mg by mouth 3 (three) times daily as needed for anxiety.      . CRESTOR 5 MG tablet Take 5 mg by mouth daily.      Marland Kitchen dexamethasone (DECADRON) 4 MG tablet 4 Milligram by mouth twice a day the day  before, day of and day after the chemotherapy every three-week.  40 tablet  1  . divalproex (DEPAKOTE) 250 MG DR tablet Take 750 mg by mouth at bedtime.      . folic acid (FOLVITE) 1 MG tablet Take 1 tablet (1 mg total) by mouth daily.  30 tablet  4  . hydroxychloroquine (PLAQUENIL) 200 MG tablet Take 200 mg by mouth 2 (two) times daily.       Marland Kitchen levothyroxine (SYNTHROID, LEVOTHROID) 25 MCG tablet Take 12.5 mcg by mouth daily before breakfast.      . oxyCODONE-acetaminophen (PERCOCET) 10-325 MG per tablet Take 1-2 tablets by mouth every 6 (six) hours as needed for pain.      Marland Kitchen prochlorperazine  (COMPAZINE) 10 MG tablet Take 1 tablet (10 mg total) by mouth every 6 (six) hours as needed for nausea or vomiting.  30 tablet  0  . triamcinolone cream (KENALOG) 0.1 % Apply 1 application topically 2 (two) times daily as needed (for itching).       . [DISCONTINUED] lisinopril-hydrochlorothiazide (PRINZIDE,ZESTORETIC) 10-12.5 MG per tablet Take 1 tablet by mouth.        No current facility-administered medications for this visit.   Facility-Administered Medications Ordered in Other Visits  Medication Dose Route Frequency Provider Last Rate Last Dose  . sodium chloride 0.9 % injection 10 mL  10 mL Intracatheter PRN [name], MD   10 mL at 04/25/14 1656    SURGICAL HISTORY:  Past Surgical History  Procedure Laterality Date  . Back surgery      x 4 L3-S1 1996,2000, 2002  . Vesicovaginal fistula closure w/ tah    . Appendectomy    . Bronchoscopy  2011  . Lung biopsy  2011    needle  . Tonsillectomy    . Abdominal hysterectomy      with b/l oophoorectomy age 65  . Lumbar fusion    . Orif humerus fracture Left 06/16/2013    Procedure: OPEN REDUCTION INTERNAL FIXATION (ORIF) LEFT SHOULDER;  Surgeon: Johnny Bridge, MD;  Location: [facility];  Service: Orthopedics;  Laterality: Left;    REVIEW OF SYSTEMS:  Constitutional: positive for anorexia and fatigue Eyes: negative Ears, nose, mouth, throat, and face: negative Respiratory: positive for cough and dyspnea on exertion Cardiovascular: negative Gastrointestinal: negative Genitourinary:negative Integument/breast: positive for rash Hematologic/lymphatic: negative Musculoskeletal:negative Neurological: negative Behavioral/Psych: negative Endocrine: negative Allergic/Immunologic: negative   PHYSICAL EXAMINATION: General appearance: alert, cooperative, fatigued and no distress Head: Normocephalic, without obvious abnormality, atraumatic Neck: no adenopathy Lymph nodes: Cervical, supraclavicular, and axillary nodes normal. Resp:  clear to auscultation bilaterally Back: symmetric, no curvature. ROM normal. No CVA tenderness. Cardio: regular rate and rhythm, S1, S2 normal, no murmur, click, rub or gallop GI: soft, non-tender; bowel sounds normal; no masses,  no organomegaly Extremities: extremities normal, atraumatic, no cyanosis or edema Neurologic: Alert and oriented X 3, normal strength and tone. Normal symmetric reflexes. Normal coordination and gait  ECOG PERFORMANCE STATUS: 2 - Symptomatic, <50% confined to bed  Blood pressure 130/82, pulse 115, temperature 98.8 F (37.1 C), temperature source Oral, resp. rate 17, height 5\' 3"  (1.6 m), weight 128 lb 12.8 oz (58.423 kg), SpO2 100.00%, peak flow 2 L/min.  LABORATORY DATA: Lab Results  Component Value Date   WBC 5.1 04/25/2014   HGB 11.5* 04/25/2014   HCT 35.1 04/25/2014   MCV 99.2 04/25/2014   PLT 473* 04/25/2014      Chemistry      Component Value Date/Time  NA 141 04/25/2014 1350   NA 145 06/28/2013 0443   NA 145 08/24/2011 1430   K 3.9 04/25/2014 1350   K 3.8 06/28/2013 0443   K 4.3 08/24/2011 1430   CL 101 06/28/2013 0443   CL 101 06/20/2012 1119   CL 95* 08/24/2011 1430   CO2 20* 04/25/2014 1350   CO2 38* 06/28/2013 0443   CO2 31 08/24/2011 1430   BUN 15.9 04/25/2014 1350   BUN 18 06/28/2013 0443   BUN 15 08/24/2011 1430   CREATININE 0.8 04/25/2014 1350   CREATININE 0.59 06/28/2013 0443   CREATININE 0.8 08/24/2011 1430      Component Value Date/Time   CALCIUM 10.1 04/25/2014 1350   CALCIUM 7.8* 06/28/2013 0443   CALCIUM 8.6 08/24/2011 1430   ALKPHOS 73 04/25/2014 1350   ALKPHOS 71 08/24/2011 1430   ALKPHOS 98 06/28/2010 0600   AST 10 04/25/2014 1350   AST 15 08/24/2011 1430   AST 14 06/28/2010 0600   ALT 10 04/25/2014 1350   ALT 7* 08/24/2011 1430   ALT 10 06/28/2010 0600   BILITOT 0.25 04/25/2014 1350   BILITOT 0.50 08/24/2011 1430   BILITOT 0.4 06/28/2010 0600       RADIOGRAPHIC STUDIES: Ct Chest W Contrast  03/28/2014    CLINICAL DATA:  Followup lung cancer  EXAM: CT CHEST WITH CONTRAST  TECHNIQUE: Multidetector CT imaging of the chest was performed during intravenous contrast administration.  CONTRAST:  42mL OMNIPAQUE IOHEXOL 300 MG/ML  SOLN  COMPARISON:  01/12/2014  FINDINGS: Mediastinum: The heart size appears normal. There is a small pericardial effusion. The trachea appears patent. Normal appearance of the esophagus. No enlarged mediastinal or hilar lymph node.  Lungs/Pleura: Changes of external beam radiation involves the left lung. There is significant volume loss from the left upper lobe and left midlung. There is resulting and deviation of the mediastinum towards the left. Index nodule within the posterior right lung base is decreased measuring 5 mm, image 45/series 5. Previously 7 mm. Right lower lobe peribronchovascular lesion is stable measuring 1.7 x 2.3 cm. Spiculated nodule in the superior segment of the right lower lobe measures 1 x 0.9 cm, image 31/series 5. Previously this measured 1.2 x 1.1 cm. Anterior medial subpleural nodule in the right upper lobe measures 7 mm, image 26/series 5. Stable from previous exam.  Upper Abdomen: The visualized portions of the liver, spleen, and pancreas are unremarkable. The adrenal glands both appear normal.  Musculoskeletal: Review of the visualized osseous structures is negative for aggressive lytic or sclerotic bone lesion. Postoperative change from hardware fixation is again noted involving the lower thoracic spine. Chronic left lateral rib fracture deformities are again noted.  IMPRESSION: 1. Stable appearance of the left lung status post therapy with external beam radiation. No specific features identified to suggest tumor recurrence in the left lung. 2. Multi focal pulmonary nodules in the right lung are stable to slightly improved in the interval. No new or progressive disease identified.   Electronically Signed   By: Kerby Moors M.D.   On: 03/28/2014 13:25    ASSESSMENT AND PLAN: This is a very pleasant 63 years old white female with metastatic non-small cell lung cancer initially diagnosed as unresectable stage IIB non-small cell lung cancer status post concurrent chemoradiation followed by consolidation chemotherapy. The patient had evidence for disease progression a few months ago and she was started on systemic chemotherapy with carboplatin and Alimta status post 3 cycles. She is tolerating her treatment fairly  well with no significant adverse effects. She had repeat CT scan of the chest performed recently that showed stable appearance of the left lung as well as stable to mildly improved disease in the right lung but no evidence for new or progressive disease. Patient was discussed with also seen by Dr. Julien Nordmann. She'll proceed with cycle #5 today as scheduled. She will continue with weekly labs and follow-up in 3 weeks prior to cycle #6.  The patient was advised to call immediately if she has any concerning symptoms in the interval.  All questions were answered. The patient knows to call the clinic with any problems, questions or concerns. We can certainly see the patient much sooner if necessary. The patient and her family agreed to the current plan.  Carlton Adam PA-C  ADDENDUM: Hematology/Oncology Attending: I had a face to face encounter with the patient. I recommended her care plan. This is a very pleasant 63 years old white female with metastatic non-small cell lung cancer, adenocarcinoma. The patient is currently undergoing systemic chemotherapy with carboplatin and Alimta status post 4 cycles and tolerating her treatment fairly well. She denied having any significant chest pain but continues to have the baseline shortness of breath and she is currently on home oxygen. She has no nausea or vomiting. I recommended for the patient to proceed with cycle #5 today as scheduled. She would come back for followup visit in 3 weeks with the next  cycle of her treatment. She was advised to call immediately if she has any concerning symptoms in the interval.  Disclaimer: This note was dictated with voice recognition software. Similar sounding words can inadvertently be transcribed and may not be corrected upon review. Eilleen Kempf., MD 04/29/2014

## 2014-04-25 NOTE — Patient Instructions (Signed)
[facility] Discharge Instructions for Patients Receiving Chemotherapy  Today you received the following chemotherapy agents Alimta and Carboplatin.  To help prevent nausea and vomiting after your treatment, we encourage you to take your nausea medication.   If you develop nausea and vomiting that is not controlled by your nausea medication, call the clinic.   BELOW ARE SYMPTOMS THAT SHOULD BE REPORTED IMMEDIATELY:  *FEVER GREATER THAN 100.5 F  *CHILLS WITH OR WITHOUT FEVER  NAUSEA AND VOMITING THAT IS NOT CONTROLLED WITH YOUR NAUSEA MEDICATION  *UNUSUAL SHORTNESS OF BREATH  *UNUSUAL BRUISING OR BLEEDING  TENDERNESS IN MOUTH AND THROAT WITH OR WITHOUT PRESENCE OF ULCERS  *URINARY PROBLEMS  *BOWEL PROBLEMS  UNUSUAL RASH Items with * indicate a potential emergency and should be followed up as soon as possible.  Feel free to call the clinic you have any questions or concerns. The clinic phone number is [phone].

## 2014-04-25 NOTE — Telephone Encounter (Signed)
Pt confirmed labs/ov per 10/28 POF, gave pt AVS.... KJ

## 2014-04-26 ENCOUNTER — Telehealth: Payer: Self-pay | Admitting: *Deleted

## 2014-04-26 NOTE — Telephone Encounter (Signed)
I have adjusted 11/18 appt

## 2014-04-28 NOTE — Patient Instructions (Signed)
Continue labs and chemotherapy as scheduled Follow-up in 3 weeks

## 2014-05-02 ENCOUNTER — Other Ambulatory Visit: Payer: Commercial Managed Care - HMO

## 2014-05-03 ENCOUNTER — Telehealth: Payer: Self-pay | Admitting: *Deleted

## 2014-05-03 NOTE — Telephone Encounter (Signed)
Pt called stating that she cannot come to lab appt this week because she has a cold.  No fever.  She will come to her lab appt next week.  Asked her to call if anything changes.  She verbalized understanding

## 2014-05-09 ENCOUNTER — Other Ambulatory Visit (HOSPITAL_BASED_OUTPATIENT_CLINIC_OR_DEPARTMENT_OTHER): Payer: Commercial Managed Care - HMO

## 2014-05-09 DIAGNOSIS — C3431 Malignant neoplasm of lower lobe, right bronchus or lung: Secondary | ICD-10-CM

## 2014-05-09 DIAGNOSIS — C3490 Malignant neoplasm of unspecified part of unspecified bronchus or lung: Secondary | ICD-10-CM

## 2014-05-09 LAB — CBC WITH DIFFERENTIAL/PLATELET
BASO%: 0.4 % (ref 0.0–2.0)
BASOS ABS: 0 10*3/uL (ref 0.0–0.1)
EOS%: 1.9 % (ref 0.0–7.0)
Eosinophils Absolute: 0.1 10*3/uL (ref 0.0–0.5)
HCT: 30.2 % — ABNORMAL LOW (ref 34.8–46.6)
HGB: 10 g/dL — ABNORMAL LOW (ref 11.6–15.9)
LYMPH%: 29.9 % (ref 14.0–49.7)
MCH: 33.1 pg (ref 25.1–34.0)
MCHC: 33.1 g/dL (ref 31.5–36.0)
MCV: 100 fL (ref 79.5–101.0)
MONO#: 0.6 10*3/uL (ref 0.1–0.9)
MONO%: 12.9 % (ref 0.0–14.0)
NEUT%: 54.9 % (ref 38.4–76.8)
NEUTROS ABS: 2.5 10*3/uL (ref 1.5–6.5)
Platelets: 175 10*3/uL (ref 145–400)
RBC: 3.01 10*6/uL — ABNORMAL LOW (ref 3.70–5.45)
RDW: 17.7 % — ABNORMAL HIGH (ref 11.2–14.5)
WBC: 4.5 10*3/uL (ref 3.9–10.3)
lymph#: 1.3 10*3/uL (ref 0.9–3.3)

## 2014-05-09 LAB — COMPREHENSIVE METABOLIC PANEL (CC13)
ALT: 10 U/L (ref 0–55)
AST: 14 U/L (ref 5–34)
Albumin: 3.7 g/dL (ref 3.5–5.0)
Alkaline Phosphatase: 71 U/L (ref 40–150)
Anion Gap: 10 mEq/L (ref 3–11)
BUN: 9.8 mg/dL (ref 7.0–26.0)
CALCIUM: 9.5 mg/dL (ref 8.4–10.4)
CO2: 24 mEq/L (ref 22–29)
CREATININE: 0.7 mg/dL (ref 0.6–1.1)
Chloride: 105 mEq/L (ref 98–109)
Glucose: 82 mg/dl (ref 70–140)
Potassium: 4.1 mEq/L (ref 3.5–5.1)
Sodium: 139 mEq/L (ref 136–145)
Total Bilirubin: 0.27 mg/dL (ref 0.20–1.20)
Total Protein: 6.6 g/dL (ref 6.4–8.3)

## 2014-05-16 ENCOUNTER — Encounter: Payer: Self-pay | Admitting: Physician Assistant

## 2014-05-16 ENCOUNTER — Ambulatory Visit (HOSPITAL_BASED_OUTPATIENT_CLINIC_OR_DEPARTMENT_OTHER): Payer: Commercial Managed Care - HMO

## 2014-05-16 ENCOUNTER — Telehealth: Payer: Self-pay | Admitting: Physician Assistant

## 2014-05-16 ENCOUNTER — Other Ambulatory Visit (HOSPITAL_BASED_OUTPATIENT_CLINIC_OR_DEPARTMENT_OTHER): Payer: Commercial Managed Care - HMO

## 2014-05-16 ENCOUNTER — Ambulatory Visit (HOSPITAL_BASED_OUTPATIENT_CLINIC_OR_DEPARTMENT_OTHER): Payer: Commercial Managed Care - HMO | Admitting: Physician Assistant

## 2014-05-16 VITALS — BP 123/95 | HR 109 | Temp 99.2°F | Resp 18 | Ht 63.0 in | Wt 130.9 lb

## 2014-05-16 DIAGNOSIS — C3431 Malignant neoplasm of lower lobe, right bronchus or lung: Secondary | ICD-10-CM

## 2014-05-16 DIAGNOSIS — C3490 Malignant neoplasm of unspecified part of unspecified bronchus or lung: Secondary | ICD-10-CM

## 2014-05-16 DIAGNOSIS — Z5111 Encounter for antineoplastic chemotherapy: Secondary | ICD-10-CM

## 2014-05-16 DIAGNOSIS — R0602 Shortness of breath: Secondary | ICD-10-CM

## 2014-05-16 DIAGNOSIS — C3430 Malignant neoplasm of lower lobe, unspecified bronchus or lung: Secondary | ICD-10-CM

## 2014-05-16 LAB — CBC WITH DIFFERENTIAL/PLATELET
BASO%: 0.2 % (ref 0.0–2.0)
BASOS ABS: 0 10*3/uL (ref 0.0–0.1)
EOS ABS: 0 10*3/uL (ref 0.0–0.5)
EOS%: 0.2 % (ref 0.0–7.0)
HCT: 34.6 % — ABNORMAL LOW (ref 34.8–46.6)
HGB: 11.3 g/dL — ABNORMAL LOW (ref 11.6–15.9)
LYMPH%: 9.9 % — ABNORMAL LOW (ref 14.0–49.7)
MCH: 32.8 pg (ref 25.1–34.0)
MCHC: 32.7 g/dL (ref 31.5–36.0)
MCV: 100.6 fL (ref 79.5–101.0)
MONO#: 0.4 10*3/uL (ref 0.1–0.9)
MONO%: 6.2 % (ref 0.0–14.0)
NEUT%: 83.5 % — ABNORMAL HIGH (ref 38.4–76.8)
NEUTROS ABS: 5.6 10*3/uL (ref 1.5–6.5)
PLATELETS: 452 10*3/uL — AB (ref 145–400)
RBC: 3.44 10*6/uL — ABNORMAL LOW (ref 3.70–5.45)
RDW: 16.6 % — AB (ref 11.2–14.5)
WBC: 6.6 10*3/uL (ref 3.9–10.3)
lymph#: 0.7 10*3/uL — ABNORMAL LOW (ref 0.9–3.3)

## 2014-05-16 LAB — COMPREHENSIVE METABOLIC PANEL (CC13)
ALBUMIN: 4 g/dL (ref 3.5–5.0)
ALK PHOS: 77 U/L (ref 40–150)
ALT: <6 U/L (ref 0–55)
AST: 13 U/L (ref 5–34)
Anion Gap: 14 mEq/L — ABNORMAL HIGH (ref 3–11)
BUN: 14.3 mg/dL (ref 7.0–26.0)
CALCIUM: 10 mg/dL (ref 8.4–10.4)
CO2: 22 mEq/L (ref 22–29)
CREATININE: 0.8 mg/dL (ref 0.6–1.1)
Chloride: 102 mEq/L (ref 98–109)
GLUCOSE: 96 mg/dL (ref 70–140)
POTASSIUM: 3.9 meq/L (ref 3.5–5.1)
Sodium: 138 mEq/L (ref 136–145)
Total Bilirubin: 0.26 mg/dL (ref 0.20–1.20)
Total Protein: 7.3 g/dL (ref 6.4–8.3)

## 2014-05-16 MED ORDER — ONDANSETRON 16 MG/50ML IVPB (CHCC)
16.0000 mg | Freq: Once | INTRAVENOUS | Status: AC
  Administered 2014-05-16: 16 mg via INTRAVENOUS

## 2014-05-16 MED ORDER — SODIUM CHLORIDE 0.9 % IV SOLN
500.0000 mg/m2 | Freq: Once | INTRAVENOUS | Status: AC
  Administered 2014-05-16: 825 mg via INTRAVENOUS
  Filled 2014-05-16: qty 33

## 2014-05-16 MED ORDER — SODIUM CHLORIDE 0.9 % IV SOLN
Freq: Once | INTRAVENOUS | Status: AC
  Administered 2014-05-16: 15:00:00 via INTRAVENOUS

## 2014-05-16 MED ORDER — DEXAMETHASONE SODIUM PHOSPHATE 20 MG/5ML IJ SOLN
INTRAMUSCULAR | Status: AC
  Filled 2014-05-16: qty 5

## 2014-05-16 MED ORDER — SODIUM CHLORIDE 0.9 % IJ SOLN
10.0000 mL | INTRAMUSCULAR | Status: DC | PRN
  Administered 2014-05-16: 10 mL
  Filled 2014-05-16: qty 10

## 2014-05-16 MED ORDER — SODIUM CHLORIDE 0.9 % IV SOLN
389.0000 mg | Freq: Once | INTRAVENOUS | Status: AC
  Administered 2014-05-16: 390 mg via INTRAVENOUS
  Filled 2014-05-16: qty 39

## 2014-05-16 MED ORDER — HEPARIN SOD (PORK) LOCK FLUSH 100 UNIT/ML IV SOLN
500.0000 [IU] | Freq: Once | INTRAVENOUS | Status: AC | PRN
  Administered 2014-05-16: 500 [IU]
  Filled 2014-05-16: qty 5

## 2014-05-16 MED ORDER — CYANOCOBALAMIN 1000 MCG/ML IJ SOLN
INTRAMUSCULAR | Status: AC
  Filled 2014-05-16: qty 1

## 2014-05-16 MED ORDER — CARBOPLATIN CHEMO INTRADERMAL TEST DOSE 100MCG/0.02ML
100.0000 ug | Freq: Once | INTRADERMAL | Status: AC
  Administered 2014-05-16: 100 ug via INTRADERMAL
  Filled 2014-05-16: qty 0.01

## 2014-05-16 MED ORDER — ONDANSETRON 16 MG/50ML IVPB (CHCC)
INTRAVENOUS | Status: AC
  Filled 2014-05-16: qty 16

## 2014-05-16 MED ORDER — DEXAMETHASONE SODIUM PHOSPHATE 20 MG/5ML IJ SOLN
20.0000 mg | Freq: Once | INTRAMUSCULAR | Status: AC
  Administered 2014-05-16: 20 mg via INTRAVENOUS

## 2014-05-16 MED ORDER — CYANOCOBALAMIN 1000 MCG/ML IJ SOLN
1000.0000 ug | Freq: Once | INTRAMUSCULAR | Status: AC
  Administered 2014-05-16: 1000 ug via INTRAMUSCULAR

## 2014-05-16 NOTE — Progress Notes (Addendum)
Central Telephone:([phone]) [phone]   Fax:(336) [place] [address] [place] [address]  PRINCIPAL DIAGNOSIS: Metastatic non-small cell lung cancer initially diagnosed as Unresectable stage IIB (T3 N0 M0) non-small cell lung cancer, adenocarcinoma diagnosed in June 2011.   PRIOR THERAPY:  1. Status post concurrent chemoradiation with weekly carboplatin and paclitaxel. Last dose was given February 24, 2010. 2. Consolidation chemotherapy with carboplatin and Alimta, status post 3 cycles. Last dose was given June 09, 2010. 3. Curative radiotherapy to a right lower lobe nodule under the care of Dr. Pablo Ledger completed 04/20/2012.  CURRENT THERAPY: Systemic chemotherapy with carboplatin for AUC of 5 and Alimta 500 mg/M2 every 3 weeks. First dose 01/24/2014. Status post 5 cycles.  INTERVAL HISTORY: Maria Lucero 63 y.o. female returns to the clinic today for followup visit accompanied her husband. The patient is tolerating her current treatment was carboplatin and Alimta fairly well with no significant adverse effects. The patient is feeling relatively well today with no specific complaints except for the baseline shortness of breath and she is currently on home oxygen. She does report some increased back pain. Overall she's eating better and his her weight has increased approximately 2 pounds. She has occasional nausea but this is well managed with her current antiemetics. She denied having any significant chest pain, cough or hemoptysis. The patient denied having any significant weight loss or night sweats.  She has no fever or chills, no current nausea or vomiting.   MEDICAL HISTORY: Past Medical History  Diagnosis Date  . Migraine headache   . Anxiety   . Urticaria   . Low back pain syndrome   . Hyperlipidemia   . Hypercholesterolemia   . Depression   . Tobacco abuse   . Myalgia and myositis, unspecified   .  Bipolar 1 disorder   . Allergy   . Asthma     per pt stated  . Blood transfusion     several x  . Cataract     recent surgery b/l  . Arthritis   . History of radiation therapy 04/11/12; 04/13/12; 04/15/12; 04/18/12; & 04/20/12    Rll lung 60Gy/5/fx  . GERD (gastroesophageal reflux disease)   . COPD (chronic obstructive pulmonary disease)     home o2 at 2l/min  . Shortness of breath     home o2 at 2l/min  . lung ca dx'd 11/2009    chemo comp 05/2010,home o2 at 2l/min  . Lung cancer     invasive left upper lobe adenocarcinoma  . Complication of anesthesia     low oxygen  . PONV (postoperative nausea and vomiting)   . Fracture of humerus, proximal, left, closed 06/16/2013    ALLERGIES:  is allergic to methocarbamol; morphine sulfate er beads; aspirin; avinza; clarithromycin; codeine; lyrica; oxycontin; phenergan; and levaquin.  MEDICATIONS:  Current Outpatient Prescriptions  Medication Sig Dispense Refill  . albuterol (PROVENTIL) (2.5 MG/3ML) 0.083% nebulizer solution Take 2.5 mg by nebulization every 6 (six) hours as needed for wheezing.    . clonazePAM (KLONOPIN) 1 MG tablet Take 1 mg by mouth at bedtime.     . clorazepate (TRANXENE) 3.75 MG tablet Take 3.75 mg by mouth 3 (three) times daily as needed for anxiety.    . CRESTOR 5 MG tablet Take 5 mg by mouth daily.    Marland Kitchen dexamethasone (DECADRON) 4 MG tablet 4 Milligram by mouth twice a day  the day before, day of and day after the chemotherapy every three-week. 40 tablet 1  . divalproex (DEPAKOTE) 250 MG DR tablet Take 750 mg by mouth at bedtime.    . folic acid (FOLVITE) 1 MG tablet Take 1 tablet (1 mg total) by mouth daily. 30 tablet 4  . hydroxychloroquine (PLAQUENIL) 200 MG tablet Take 200 mg by mouth 2 (two) times daily.     Marland Kitchen levothyroxine (SYNTHROID, LEVOTHROID) 25 MCG tablet Take 12.5 mcg by mouth daily before breakfast.    . oxyCODONE-acetaminophen (PERCOCET) 10-325 MG per tablet Take 1-2 tablets by mouth every 6 (six)  hours as needed for pain.    Marland Kitchen prochlorperazine (COMPAZINE) 10 MG tablet Take 1 tablet (10 mg total) by mouth every 6 (six) hours as needed for nausea or vomiting. 30 tablet 0  . triamcinolone cream (KENALOG) 0.1 % Apply 1 application topically 2 (two) times daily as needed (for itching).     . [DISCONTINUED] lisinopril-hydrochlorothiazide (PRINZIDE,ZESTORETIC) 10-12.5 MG per tablet Take 1 tablet by mouth.      No current facility-administered medications for this visit.   Facility-Administered Medications Ordered in Other Visits  Medication Dose Route Frequency Provider Last Rate Last Dose  . CARBOplatin (PARAPLATIN) 390 mg in sodium chloride 0.9 % 100 mL chemo infusion  390 mg Intravenous Once [name], MD 278 mL/hr at 05/16/14 1614 390 mg at 05/16/14 1614  . heparin lock flush 100 unit/mL  500 Units Intracatheter Once PRN [name], MD      . sodium chloride 0.9 % injection 10 mL  10 mL Intracatheter PRN [name], MD        SURGICAL HISTORY:  Past Surgical History  Procedure Laterality Date  . Back surgery      x 4 L3-S1 1996,2000, 2002  . Vesicovaginal fistula closure w/ tah    . Appendectomy    . Bronchoscopy  2011  . Lung biopsy  2011    needle  . Tonsillectomy    . Abdominal hysterectomy      with b/l oophoorectomy age 60  . Lumbar fusion    . Orif humerus fracture Left 06/16/2013    Procedure: OPEN REDUCTION INTERNAL FIXATION (ORIF) LEFT SHOULDER;  Surgeon: Johnny Bridge, MD;  Location: [facility];  Service: Orthopedics;  Laterality: Left;    REVIEW OF SYSTEMS:  Constitutional: positive for anorexia and fatigue Eyes: negative Ears, nose, mouth, throat, and face: negative Respiratory: positive for cough and dyspnea on exertion Cardiovascular: negative Gastrointestinal: negative Genitourinary:negative Integument/breast: negative Hematologic/lymphatic: negative Musculoskeletal:positive for back pain Neurological: negative Behavioral/Psych:  negative Endocrine: negative Allergic/Immunologic: negative   PHYSICAL EXAMINATION: General appearance: alert, cooperative, fatigued and no distress Head: Normocephalic, without obvious abnormality, atraumatic Neck: no adenopathy Lymph nodes: Cervical, supraclavicular, and axillary nodes normal. Resp: clear to auscultation bilaterally Back: symmetric, no curvature. ROM normal. No CVA tenderness. Cardio: regular rate and rhythm, S1, S2 normal, no murmur, click, rub or gallop GI: soft, non-tender; bowel sounds normal; no masses,  no organomegaly Extremities: extremities normal, atraumatic, no cyanosis or edema Neurologic: Alert and oriented X 3, normal strength and tone. Normal symmetric reflexes. Normal coordination and gait  ECOG PERFORMANCE STATUS: 2 - Symptomatic, <50% confined to bed  Blood pressure 123/95, pulse 109, temperature 99.2 F (37.3 C), temperature source Oral, resp. rate 18, height 5\' 3"  (1.6 m), weight 130 lb 14.4 oz (59.376 kg), SpO2 97 %.  LABORATORY DATA: Lab Results  Component Value Date   WBC 6.6 05/16/2014  HGB 11.3* 05/16/2014   HCT 34.6* 05/16/2014   MCV 100.6 05/16/2014   PLT 452* 05/16/2014      Chemistry      Component Value Date/Time   NA 138 05/16/2014 1339   NA 145 06/28/2013 0443   NA 145 08/24/2011 1430   K 3.9 05/16/2014 1339   K 3.8 06/28/2013 0443   K 4.3 08/24/2011 1430   CL 101 06/28/2013 0443   CL 101 06/20/2012 1119   CL 95* 08/24/2011 1430   CO2 22 05/16/2014 1339   CO2 38* 06/28/2013 0443   CO2 31 08/24/2011 1430   BUN 14.3 05/16/2014 1339   BUN 18 06/28/2013 0443   BUN 15 08/24/2011 1430   CREATININE 0.8 05/16/2014 1339   CREATININE 0.59 06/28/2013 0443   CREATININE 0.8 08/24/2011 1430      Component Value Date/Time   CALCIUM 10.0 05/16/2014 1339   CALCIUM 7.8* 06/28/2013 0443   CALCIUM 8.6 08/24/2011 1430   ALKPHOS 77 05/16/2014 1339   ALKPHOS 71 08/24/2011 1430   ALKPHOS 98 06/28/2010 0600   AST 13 05/16/2014 1339    AST 15 08/24/2011 1430   AST 14 06/28/2010 0600   ALT <6 05/16/2014 1339   ALT 7* 08/24/2011 1430   ALT 10 06/28/2010 0600   BILITOT 0.26 05/16/2014 1339   BILITOT 0.50 08/24/2011 1430   BILITOT 0.4 06/28/2010 0600       RADIOGRAPHIC STUDIES: Ct Chest W Contrast  03/28/2014   CLINICAL DATA:  Followup lung cancer  EXAM: CT CHEST WITH CONTRAST  TECHNIQUE: Multidetector CT imaging of the chest was performed during intravenous contrast administration.  CONTRAST:  65mL OMNIPAQUE IOHEXOL 300 MG/ML  SOLN  COMPARISON:  01/12/2014  FINDINGS: Mediastinum: The heart size appears normal. There is a small pericardial effusion. The trachea appears patent. Normal appearance of the esophagus. No enlarged mediastinal or hilar lymph node.  Lungs/Pleura: Changes of external beam radiation involves the left lung. There is significant volume loss from the left upper lobe and left midlung. There is resulting and deviation of the mediastinum towards the left. Index nodule within the posterior right lung base is decreased measuring 5 mm, image 45/series 5. Previously 7 mm. Right lower lobe peribronchovascular lesion is stable measuring 1.7 x 2.3 cm. Spiculated nodule in the superior segment of the right lower lobe measures 1 x 0.9 cm, image 31/series 5. Previously this measured 1.2 x 1.1 cm. Anterior medial subpleural nodule in the right upper lobe measures 7 mm, image 26/series 5. Stable from previous exam.  Upper Abdomen: The visualized portions of the liver, spleen, and pancreas are unremarkable. The adrenal glands both appear normal.  Musculoskeletal: Review of the visualized osseous structures is negative for aggressive lytic or sclerotic bone lesion. Postoperative change from hardware fixation is again noted involving the lower thoracic spine. Chronic left lateral rib fracture deformities are again noted.  IMPRESSION: 1. Stable appearance of the left lung status post therapy with external beam radiation. No specific  features identified to suggest tumor recurrence in the left lung. 2. Multi focal pulmonary nodules in the right lung are stable to slightly improved in the interval. No new or progressive disease identified.   Electronically Signed   By: Kerby Moors M.D.   On: 03/28/2014 13:25   ASSESSMENT AND PLAN: This is a very pleasant 63 years old white female with metastatic non-small cell lung cancer initially diagnosed as unresectable stage IIB non-small cell lung cancer status post concurrent chemoradiation followed by  consolidation chemotherapy. The patient had evidence for disease progression a few months ago and she was started on systemic chemotherapy with carboplatin and Alimta, status post 5 cycles. She is tolerating her treatment fairly well with no significant adverse effects. Patient was discussed with also seen by Dr. Julien Nordmann. She'll proceed with cycle #6 today as scheduled. She will continue with weekly labs and follow-up in 3 weeks with a restaging CT scan of her chest with contrast to reevaluate her disease.  The patient was advised to call immediately if she has any concerning symptoms in the interval.  All questions were answered. The patient knows to call the clinic with any problems, questions or concerns. We can certainly see the patient much sooner if necessary. The patient and her family agreed to the current plan.   Disclaimer: This note was dictated with voice recognition software. Similar sounding words can inadvertently be transcribed and may not be corrected upon review. Carlton Adam, PA-C 05/16/2014  ADDENDUM: Hematology/Oncology Attending: I had a face to face encounter with the patient. I recommended her care plan. This is a very pleasant 63 years old white female with metastatic non-small cell lung cancer, adenocarcinoma. The patient is currently undergoing systemic chemotherapy with carboplatin and Alimta status post 5 cycles. She is tolerating her treatment fairly well  with no significant adverse effects. She continues to have the baseline shortness of breath and she is currently on home oxygen.  The patient will proceed with cycle #6 today as a scheduled. She would come back for follow-up visit in 3 weeks for reevaluation after repeating CT scan of the chest for restaging of her disease. She was advised to call immediately if she has any concerning symptoms in the interval.   Disclaimer: This note was dictated with voice recognition software. Similar sounding words can inadvertently be transcribed and may be missed upon review. Eilleen Kempf., MD 05/19/2014

## 2014-05-16 NOTE — Telephone Encounter (Signed)
Gave avs & cal for Dec. °

## 2014-05-16 NOTE — Patient Instructions (Signed)
[facility] Discharge Instructions for Patients Receiving Chemotherapy  Today you received the following chemotherapy agents Carboplatin and Alimta.   To help prevent nausea and vomiting after your treatment, we encourage you to take your nausea medication.   If you develop nausea and vomiting that is not controlled by your nausea medication, call the clinic.   BELOW ARE SYMPTOMS THAT SHOULD BE REPORTED IMMEDIATELY:  *FEVER GREATER THAN 100.5 F  *CHILLS WITH OR WITHOUT FEVER  NAUSEA AND VOMITING THAT IS NOT CONTROLLED WITH YOUR NAUSEA MEDICATION  *UNUSUAL SHORTNESS OF BREATH  *UNUSUAL BRUISING OR BLEEDING  TENDERNESS IN MOUTH AND THROAT WITH OR WITHOUT PRESENCE OF ULCERS  *URINARY PROBLEMS  *BOWEL PROBLEMS  UNUSUAL RASH Items with * indicate a potential emergency and should be followed up as soon as possible.  Feel free to call the clinic you have any questions or concerns. The clinic phone number is [phone].

## 2014-05-19 NOTE — Patient Instructions (Signed)
Continue weekly Lantus scheduled Follow-up in 3 weeks with a restaging CT scan of the chest to reevaluate your disease

## 2014-05-21 ENCOUNTER — Telehealth: Payer: Self-pay | Admitting: *Deleted

## 2014-05-21 ENCOUNTER — Telehealth: Payer: Self-pay | Admitting: Internal Medicine

## 2014-05-21 NOTE — Telephone Encounter (Signed)
Confirm appt d/t for Dec. Mailed cal.

## 2014-05-21 NOTE — Telephone Encounter (Signed)
Per staff message and POF I have scheduled appts. Advised scheduler of appts. JMW  

## 2014-05-23 ENCOUNTER — Telehealth: Payer: Self-pay | Admitting: Internal Medicine

## 2014-05-23 ENCOUNTER — Other Ambulatory Visit: Payer: Commercial Managed Care - HMO

## 2014-05-23 NOTE — Telephone Encounter (Signed)
returned call s.w pt call and r/s missed lab to same day as ct....pt ok adn aware

## 2014-06-01 ENCOUNTER — Other Ambulatory Visit: Payer: Commercial Managed Care - HMO

## 2014-06-01 ENCOUNTER — Ambulatory Visit (HOSPITAL_COMMUNITY): Payer: Medicare HMO

## 2014-06-04 ENCOUNTER — Encounter: Payer: Self-pay | Admitting: Internal Medicine

## 2014-06-04 ENCOUNTER — Other Ambulatory Visit: Payer: Self-pay | Admitting: Medical Oncology

## 2014-06-04 NOTE — Progress Notes (Signed)
[phone] is for agent.  He gave id for new card [id number]  Group# [id number] note for patient to present on her next visit.

## 2014-06-04 NOTE — Progress Notes (Signed)
Call from insurance company. The patient's coverge with [employer] termed 05/28/14. [employer] started 05/29/14.

## 2014-06-05 ENCOUNTER — Other Ambulatory Visit: Payer: Commercial Managed Care - HMO

## 2014-06-05 ENCOUNTER — Ambulatory Visit: Payer: Commercial Managed Care - HMO | Admitting: Internal Medicine

## 2014-06-05 ENCOUNTER — Ambulatory Visit: Payer: Commercial Managed Care - HMO

## 2014-06-06 ENCOUNTER — Other Ambulatory Visit: Payer: Commercial Managed Care - HMO

## 2014-06-06 ENCOUNTER — Ambulatory Visit: Payer: Commercial Managed Care - HMO | Admitting: Internal Medicine

## 2014-06-07 ENCOUNTER — Ambulatory Visit (HOSPITAL_COMMUNITY)
Admission: RE | Admit: 2014-06-07 | Discharge: 2014-06-07 | Disposition: A | Discharge status: Home / Self Care | Payer: Medicare Other | Source: Ambulatory Visit | Attending: Physician Assistant | Admitting: Physician Assistant

## 2014-06-07 ENCOUNTER — Encounter (HOSPITAL_COMMUNITY): Payer: Self-pay

## 2014-06-07 ENCOUNTER — Other Ambulatory Visit (HOSPITAL_BASED_OUTPATIENT_CLINIC_OR_DEPARTMENT_OTHER): Payer: Medicare Other

## 2014-06-07 DIAGNOSIS — C3490 Malignant neoplasm of unspecified part of unspecified bronchus or lung: Secondary | ICD-10-CM

## 2014-06-07 DIAGNOSIS — C3430 Malignant neoplasm of lower lobe, unspecified bronchus or lung: Secondary | ICD-10-CM | POA: Diagnosis present

## 2014-06-07 DIAGNOSIS — C3431 Malignant neoplasm of lower lobe, right bronchus or lung: Secondary | ICD-10-CM

## 2014-06-07 LAB — CBC WITH DIFFERENTIAL/PLATELET
BASO%: 0.5 % (ref 0.0–2.0)
Basophils Absolute: 0 10*3/uL (ref 0.0–0.1)
EOS%: 1.6 % (ref 0.0–7.0)
Eosinophils Absolute: 0.1 10*3/uL (ref 0.0–0.5)
HCT: 35.4 % (ref 34.8–46.6)
HGB: 11.4 g/dL — ABNORMAL LOW (ref 11.6–15.9)
LYMPH%: 31.7 % (ref 14.0–49.7)
MCH: 33.2 pg (ref 25.1–34.0)
MCHC: 32.2 g/dL (ref 31.5–36.0)
MCV: 103.2 fL — AB (ref 79.5–101.0)
MONO#: 0.5 10*3/uL (ref 0.1–0.9)
MONO%: 13.1 % (ref 0.0–14.0)
NEUT%: 53.1 % (ref 38.4–76.8)
NEUTROS ABS: 2 10*3/uL (ref 1.5–6.5)
Platelets: 447 10*3/uL — ABNORMAL HIGH (ref 145–400)
RBC: 3.43 10*6/uL — AB (ref 3.70–5.45)
RDW: 16.3 % — ABNORMAL HIGH (ref 11.2–14.5)
WBC: 3.8 10*3/uL — ABNORMAL LOW (ref 3.9–10.3)
lymph#: 1.2 10*3/uL (ref 0.9–3.3)

## 2014-06-07 LAB — COMPREHENSIVE METABOLIC PANEL (CC13)
ALBUMIN: 4.1 g/dL (ref 3.5–5.0)
ALK PHOS: 78 U/L (ref 40–150)
ALT: 9 U/L (ref 0–55)
AST: 13 U/L (ref 5–34)
Anion Gap: 12 mEq/L — ABNORMAL HIGH (ref 3–11)
BUN: 11.2 mg/dL (ref 7.0–26.0)
CO2: 24 mEq/L (ref 22–29)
Calcium: 10 mg/dL (ref 8.4–10.4)
Chloride: 104 mEq/L (ref 98–109)
Creatinine: 0.7 mg/dL (ref 0.6–1.1)
EGFR: 86 mL/min/{1.73_m2} — ABNORMAL LOW (ref >90)
Glucose: 82 mg/dl (ref 70–140)
Potassium: 4.5 mEq/L (ref 3.5–5.1)
Sodium: 140 mEq/L (ref 136–145)
TOTAL PROTEIN: 7.3 g/dL (ref 6.4–8.3)
Total Bilirubin: 0.25 mg/dL (ref 0.20–1.20)

## 2014-06-07 MED ORDER — IOHEXOL 300 MG/ML  SOLN
80.0000 mL | Freq: Once | INTRAMUSCULAR | Status: AC | PRN
  Administered 2014-06-07: 80 mL via INTRAVENOUS

## 2014-06-14 ENCOUNTER — Ambulatory Visit (HOSPITAL_BASED_OUTPATIENT_CLINIC_OR_DEPARTMENT_OTHER): Payer: Medicare Other | Admitting: Physician Assistant

## 2014-06-14 ENCOUNTER — Encounter: Payer: Self-pay | Admitting: Physician Assistant

## 2014-06-14 ENCOUNTER — Telehealth: Payer: Self-pay | Admitting: Physician Assistant

## 2014-06-14 VITALS — BP 149/71 | HR 102 | Temp 98.3°F | Resp 21 | Ht 63.0 in | Wt 134.4 lb

## 2014-06-14 DIAGNOSIS — C3431 Malignant neoplasm of lower lobe, right bronchus or lung: Secondary | ICD-10-CM

## 2014-06-14 NOTE — Progress Notes (Addendum)
[place] Telephone:(336) [phone]   Fax:(336) [place], MD [address] [place] [address]  PRINCIPAL DIAGNOSIS: Metastatic non-small cell lung cancer initially diagnosed as Unresectable stage IIB (T3 N0 M0) non-small cell lung cancer, adenocarcinoma diagnosed in June 2011.   PRIOR THERAPY:  1. Status post concurrent chemoradiation with weekly carboplatin and paclitaxel. Last dose was given February 24, 2010. 2. Consolidation chemotherapy with carboplatin and Alimta, status post 3 cycles. Last dose was given June 09, 2010. 3. Curative radiotherapy to a right lower lobe nodule under the care of Dr. Pablo Ledger completed 04/20/2012.  CURRENT THERAPY: Systemic chemotherapy with carboplatin for AUC of 5 and Alimta 500 mg/M2 every 3 weeks. First dose 01/24/2014. Status post 6 cycles.  INTERVAL HISTORY: Maria Lucero 63 y.o. female returns to the clinic today for followup visit accompanied her husband and daughter. The patient is tolerated her treatment with carboplatin and Alimta fairly well with no significant adverse effects. The patient is feeling relatively well today with no specific complaints except for the baseline shortness of breath and she is currently on home oxygen. She continues to complain of some increased back pain. Overall she's eating better and her weight has increased approximately 4 pounds.  She denied having any significant chest pain, cough or hemoptysis. The patient denied having any significant weight loss or night sweats.  She has no fever or chills, no current nausea or vomiting.   MEDICAL HISTORY: Past Medical History  Diagnosis Date  . Migraine headache   . Anxiety   . Urticaria   . Low back pain syndrome   . Hyperlipidemia   . Hypercholesterolemia   . Depression   . Tobacco abuse   . Myalgia and myositis, unspecified   . Bipolar 1 disorder   . Allergy   . Asthma     per pt stated    . Blood transfusion     several x  . Cataract     recent surgery b/l  . Arthritis   . History of radiation therapy 04/11/12; 04/13/12; 04/15/12; 04/18/12; & 04/20/12    Rll lung 60Gy/5/fx  . GERD (gastroesophageal reflux disease)   . COPD (chronic obstructive pulmonary disease)     home o2 at 2l/min  . Shortness of breath     home o2 at 2l/min  . Complication of anesthesia     low oxygen  . PONV (postoperative nausea and vomiting)   . Fracture of humerus, proximal, left, closed 06/16/2013  . lung ca dx'd 11/2009    chemo comp 05/2010,home o2 at 2l/min  . Lung cancer     invasive left upper lobe adenocarcinoma    ALLERGIES:  is allergic to methocarbamol; morphine sulfate er beads; aspirin; avinza; clarithromycin; codeine; lyrica; oxycontin; phenergan; and levaquin.  MEDICATIONS:  Current Outpatient Prescriptions  Medication Sig Dispense Refill  . albuterol (PROVENTIL) (2.5 MG/3ML) 0.083% nebulizer solution Take 2.5 mg by nebulization every 6 (six) hours as needed for wheezing.    . clonazePAM (KLONOPIN) 1 MG tablet Take 1 mg by mouth at bedtime.     . clorazepate (TRANXENE) 3.75 MG tablet Take 3.75 mg by mouth 3 (three) times daily as needed for anxiety.    Marland Kitchen dexamethasone (DECADRON) 4 MG tablet 4 Milligram by mouth twice a day the day before, day of and day after the chemotherapy every three-week. 40 tablet 1  . divalproex (DEPAKOTE) 250 MG DR tablet  Take 750 mg by mouth at bedtime.    . folic acid (FOLVITE) 1 MG tablet Take 1 tablet (1 mg total) by mouth daily. 30 tablet 4  . hydroxychloroquine (PLAQUENIL) 200 MG tablet Take 200 mg by mouth 2 (two) times daily.     Marland Kitchen levothyroxine (SYNTHROID, LEVOTHROID) 25 MCG tablet Take 12.5 mcg by mouth daily before breakfast.    . oxyCODONE-acetaminophen (PERCOCET) 10-325 MG per tablet Take 1-2 tablets by mouth every 6 (six) hours as needed for pain.    Marland Kitchen prochlorperazine (COMPAZINE) 10 MG tablet Take 1 tablet (10 mg total) by mouth every 6  (six) hours as needed for nausea or vomiting. 30 tablet 0  . triamcinolone cream (KENALOG) 0.1 % Apply 1 application topically 2 (two) times daily as needed (for itching).     . CRESTOR 5 MG tablet Take 5 mg by mouth daily.    . [DISCONTINUED] lisinopril-hydrochlorothiazide (PRINZIDE,ZESTORETIC) 10-12.5 MG per tablet Take 1 tablet by mouth.      No current facility-administered medications for this visit.    SURGICAL HISTORY:  Past Surgical History  Procedure Laterality Date  . Back surgery      x 4 L3-S1 1996,2000, 2002  . Vesicovaginal fistula closure w/ tah    . Appendectomy    . Bronchoscopy  2011  . Lung biopsy  2011    needle  . Tonsillectomy    . Abdominal hysterectomy      with b/l oophoorectomy age 72  . Lumbar fusion    . Orif humerus fracture Left 06/16/2013    Procedure: OPEN REDUCTION INTERNAL FIXATION (ORIF) LEFT SHOULDER;  Surgeon: Johnny Bridge, MD;  Location: [facility];  Service: Orthopedics;  Laterality: Left;    REVIEW OF SYSTEMS:  Constitutional: positive for anorexia and fatigue Eyes: negative Ears, nose, mouth, throat, and face: negative Respiratory: positive for cough and dyspnea on exertion Cardiovascular: negative Gastrointestinal: negative Genitourinary:negative Integument/breast: negative Hematologic/lymphatic: negative Musculoskeletal:positive for back pain Neurological: negative Behavioral/Psych: negative Endocrine: negative Allergic/Immunologic: negative   PHYSICAL EXAMINATION: General appearance: alert, cooperative, fatigued and no distress Head: Normocephalic, without obvious abnormality, atraumatic Neck: no adenopathy Lymph nodes: Cervical, supraclavicular, and axillary nodes normal. Resp: clear to auscultation bilaterally Back: symmetric, no curvature. ROM normal. No CVA tenderness. Cardio: regular rate and rhythm, S1, S2 normal, no murmur, click, rub or gallop GI: soft, non-tender; bowel sounds normal; no masses,  no  organomegaly Extremities: extremities normal, atraumatic, no cyanosis or edema Neurologic: Alert and oriented X 3, normal strength and tone. Normal symmetric reflexes. Normal coordination and gait  ECOG PERFORMANCE STATUS: 2 - Symptomatic, <50% confined to bed  Blood pressure 149/71, pulse 102, temperature 98.3 F (36.8 C), temperature source Oral, resp. rate 21, height 5\' 3"  (1.6 m), weight 134 lb 6.4 oz (60.963 kg), SpO2 99 %.  LABORATORY DATA: Lab Results  Component Value Date   WBC 3.8* 06/07/2014   HGB 11.4* 06/07/2014   HCT 35.4 06/07/2014   MCV 103.2* 06/07/2014   PLT 447* 06/07/2014      Chemistry      Component Value Date/Time   NA 140 06/07/2014 1224   NA 145 06/28/2013 0443   NA 145 08/24/2011 1430   K 4.5 06/07/2014 1224   K 3.8 06/28/2013 0443   K 4.3 08/24/2011 1430   CL 101 06/28/2013 0443   CL 101 06/20/2012 1119   CL 95* 08/24/2011 1430   CO2 24 06/07/2014 1224   CO2 38* 06/28/2013 0443   CO2  31 08/24/2011 1430   BUN 11.2 06/07/2014 1224   BUN 18 06/28/2013 0443   BUN 15 08/24/2011 1430   CREATININE 0.7 06/07/2014 1224   CREATININE 0.59 06/28/2013 0443   CREATININE 0.8 08/24/2011 1430      Component Value Date/Time   CALCIUM 10.0 06/07/2014 1224   CALCIUM 7.8* 06/28/2013 0443   CALCIUM 8.6 08/24/2011 1430   ALKPHOS 78 06/07/2014 1224   ALKPHOS 71 08/24/2011 1430   ALKPHOS 98 06/28/2010 0600   AST 13 06/07/2014 1224   AST 15 08/24/2011 1430   AST 14 06/28/2010 0600   ALT 9 06/07/2014 1224   ALT 7* 08/24/2011 1430   ALT 10 06/28/2010 0600   BILITOT 0.25 06/07/2014 1224   BILITOT 0.50 08/24/2011 1430   BILITOT 0.4 06/28/2010 0600       RADIOGRAPHIC STUDIES: Ct Chest W Contrast  06/07/2014   CLINICAL DATA:  Patient with history of lung cancer diagnosed 2011. Status post chemotherapy and radiation.  EXAM: CT CHEST WITH CONTRAST  TECHNIQUE: Multidetector CT imaging of the chest was performed during intravenous contrast administration.   CONTRAST:  57mL OMNIPAQUE IOHEXOL 300 MG/ML  SOLN  COMPARISON:  CT 03/28/2014  FINDINGS: Right anterior chest wall Port-A-Cath is present. Tip terminates at the superior cavoatrial junction. Visualized thyroid is grossly unremarkable. No enlarged axillary, mediastinal or hilar lymphadenopathy. Small pericardial effusion.  Central airways are patent. Re- demonstrated post radiation change involving the mid and upper aspect of the left lung. There is resultant leftward shift of the mediastinum. Nodule within the subpleural right lower lobe measures 4 mm, previously 5 mm (image 43; series 5). Right lower lobe peribronchovascular lesion is stable to slightly decreased in size measuring 1.5 x 1.9 cm, previously 2.3 x 1.7 cm (image 36; series 5). Spiculated nodule within the superior segment right lower lobe is grossly stable measuring 1.0 x 0.7 cm, previously 1.0 x 0.9 cm (image 29; series 5).  Visualization of the upper abdomen demonstrates no focal abnormality. Normal adrenal glands.  Re- demonstrated post fixation hardware involving the lower thoracic spine. No aggressive or acute appearing osseous lesions.  IMPRESSION: Stable appearance of the left lung status post radiation therapy. No definite evidence to suggest local recurrence.  Stable to slight interval decrease in size of multiple nodules within the right lung.   Electronically Signed   By: Lovey Newcomer M.D.   On: 06/07/2014 16:51   ASSESSMENT AND PLAN: This is a very pleasant 63 years old white female with metastatic non-small cell lung cancer initially diagnosed as unresectable stage IIB non-small cell lung cancer status post concurrent chemoradiation followed by consolidation chemotherapy. The patient had evidence for disease progression a few months ago and she was started on systemic chemotherapy with carboplatin and Alimta, status post 6 cycles. She tolerated her treatment fairly well with no significant adverse effects. Patient was discussed with also  seen by Dr. Julien Nordmann. She is status post 6 cycles of systemic chemotherapy with carboplatin and Alimta. Her recent restaging CT scan revealed stable disease. She would like to take a break and not proceed with maintenance chemotherapy at this time. She will follow-up in 3 months with a restaging CT scan of her chest with contrast to reevaluate her disease.   The patient was advised to call immediately if she has any concerning symptoms in the interval.  All questions were answered. The patient knows to call the clinic with any problems, questions or concerns. We can certainly see the patient much  sooner if necessary. The patient and her family agreed to the current plan.   Disclaimer: This note was dictated with voice recognition software. Similar sounding words can inadvertently be transcribed and may not be corrected upon review. Carlton Adam, PA-C 06/14/2014  ADDENDUM: Hematology/Oncology Attending: I had a face to face encounter with the patient. I recommended her care plan. This is a very pleasant 63 years old white female with metastatic non-small cell lung cancer, adenocarcinoma recently completed 6 cycles of systemic chemotherapy with carbo platinum and Alimta. The restaging scan showed no evidence for disease progression. I discussed the scan results with the patient and her husband. I gave her the option of consideration of maintenance chemotherapy with single agent Alimta every C weeks versus close observation and monitoring. The patient would like to take a break for the holiday without any treatment. She would like to continue on observation. I will see her back for follow-up visit in 3 months with repeat CT scan of the chest for restaging of her disease. For COPD, she will continue with home oxygen and inhaler treatment. The patient was advised to call immediately if she has any concerning symptoms in the interval.  Disclaimer: This note was dictated with voice recognition  software. Similar sounding words can inadvertently be transcribed and may be missed upon review. Eilleen Kempf., MD 06/16/2014

## 2014-06-14 NOTE — Patient Instructions (Signed)
Your recent restaging CT scan revealed stable disease Follow up in 3 months with a restaging CT scan of your chest to re-evaluate your disease

## 2014-06-14 NOTE — Telephone Encounter (Signed)
Pt confirmed labs/ov per 12/17 POF, gave pt AVS.... KJ

## 2014-09-07 ENCOUNTER — Ambulatory Visit (HOSPITAL_COMMUNITY)
Admission: RE | Admit: 2014-09-07 | Discharge: 2014-09-07 | Disposition: A | Discharge status: Home / Self Care | Payer: Medicare Other | Source: Ambulatory Visit | Attending: Physician Assistant | Admitting: Physician Assistant

## 2014-09-07 ENCOUNTER — Other Ambulatory Visit (HOSPITAL_BASED_OUTPATIENT_CLINIC_OR_DEPARTMENT_OTHER): Payer: Medicare Other

## 2014-09-07 DIAGNOSIS — C3431 Malignant neoplasm of lower lobe, right bronchus or lung: Secondary | ICD-10-CM

## 2014-09-07 DIAGNOSIS — Z87891 Personal history of nicotine dependence: Secondary | ICD-10-CM | POA: Insufficient documentation

## 2014-09-07 DIAGNOSIS — Z9221 Personal history of antineoplastic chemotherapy: Secondary | ICD-10-CM | POA: Diagnosis not present

## 2014-09-07 LAB — CBC WITH DIFFERENTIAL/PLATELET
BASO%: 0.9 % (ref 0.0–2.0)
Basophils Absolute: 0 10*3/uL (ref 0.0–0.1)
EOS%: 3.1 % (ref 0.0–7.0)
Eosinophils Absolute: 0.2 10*3/uL (ref 0.0–0.5)
HCT: 37.7 % (ref 34.8–46.6)
HGB: 12.2 g/dL (ref 11.6–15.9)
LYMPH#: 1.1 10*3/uL (ref 0.9–3.3)
LYMPH%: 21.6 % (ref 14.0–49.7)
MCH: 31.8 pg (ref 25.1–34.0)
MCHC: 32.5 g/dL (ref 31.5–36.0)
MCV: 97.8 fL (ref 79.5–101.0)
MONO#: 0.4 10*3/uL (ref 0.1–0.9)
MONO%: 8.1 % (ref 0.0–14.0)
NEUT#: 3.4 10*3/uL (ref 1.5–6.5)
NEUT%: 66.3 % (ref 38.4–76.8)
Platelets: 260 10*3/uL (ref 145–400)
RBC: 3.85 10*6/uL (ref 3.70–5.45)
RDW: 13.2 % (ref 11.2–14.5)
WBC: 5.2 10*3/uL (ref 3.9–10.3)

## 2014-09-07 LAB — COMPREHENSIVE METABOLIC PANEL (CC13)
ALBUMIN: 3.9 g/dL (ref 3.5–5.0)
ALK PHOS: 60 U/L (ref 40–150)
ALT: <6 U/L (ref 0–55)
AST: 9 U/L (ref 5–34)
Anion Gap: 12 mEq/L — ABNORMAL HIGH (ref 3–11)
BUN: 7 mg/dL (ref 7.0–26.0)
CALCIUM: 9.4 mg/dL (ref 8.4–10.4)
CHLORIDE: 102 meq/L (ref 98–109)
CO2: 24 mEq/L (ref 22–29)
Creatinine: 0.7 mg/dL (ref 0.6–1.1)
EGFR: >90 mL/min/{1.73_m2} (ref >90)
GLUCOSE: 87 mg/dL (ref 70–140)
POTASSIUM: 4.3 meq/L (ref 3.5–5.1)
SODIUM: 138 meq/L (ref 136–145)
TOTAL PROTEIN: 6.7 g/dL (ref 6.4–8.3)
Total Bilirubin: 0.25 mg/dL (ref 0.20–1.20)

## 2014-09-07 MED ORDER — IOHEXOL 300 MG/ML  SOLN
80.0000 mL | Freq: Once | INTRAMUSCULAR | Status: AC | PRN
  Administered 2014-09-07: 80 mL via INTRAVENOUS

## 2014-09-11 ENCOUNTER — Telehealth: Payer: Self-pay | Admitting: Internal Medicine

## 2014-09-11 ENCOUNTER — Encounter: Payer: Self-pay | Admitting: Internal Medicine

## 2014-09-11 ENCOUNTER — Ambulatory Visit (HOSPITAL_BASED_OUTPATIENT_CLINIC_OR_DEPARTMENT_OTHER): Payer: Medicare Other | Admitting: Internal Medicine

## 2014-09-11 VITALS — BP 129/63 | HR 91 | Temp 99.0°F | Resp 18 | Ht 63.0 in | Wt 134.8 lb

## 2014-09-11 DIAGNOSIS — C3431 Malignant neoplasm of lower lobe, right bronchus or lung: Secondary | ICD-10-CM

## 2014-09-11 NOTE — Telephone Encounter (Signed)
Gave avs & calendar for June. °

## 2014-09-11 NOTE — Progress Notes (Signed)
[facility] Telephone:([phone]   Fax:(336) [place], MD [address] [place] [address]  PRINCIPAL DIAGNOSIS: Metastatic non-small cell lung cancer initially diagnosed as Unresectable stage IIB (T3 N0 M0) non-small cell lung cancer, adenocarcinoma diagnosed in June 2011.   PRIOR THERAPY:  1. Status post concurrent chemoradiation with weekly carboplatin and paclitaxel. Last dose was given February 24, 2010. 2. Consolidation chemotherapy with carboplatin and Alimta, status post 3 cycles. Last dose was given June 09, 2010. 3. Curative radiotherapy to a right lower lobe nodule under the care of Dr. Pablo Ledger completed 04/20/2012.  CURRENT THERAPY: Systemic chemotherapy with carboplatin for AUC of 5 and Alimta 500 mg/M2 every 3 weeks. First dose 01/24/2014. Status post 3 cycles.  INTERVAL HISTORY: Maria Lucero 64 y.o. female returns to the clinic today for followup visit accompanied her husband. She completed 6 cycles of systemic chemotherapy with carboplatin and Alimta with partial response. The patient has been on observation for the last 6 months and has been doing fairly well. She denied having any significant chest pain, cough or hemoptysis. The patient denied having any significant weight loss but has some night sweats recently secondary to thyroid dysfunction. She has no fever or chills, no nausea or vomiting. She had repeat CT scan of the chest performed recently and she is here for evaluation and discussion of her scan results.   MEDICAL HISTORY: Past Medical History  Diagnosis Date  . Migraine headache   . Anxiety   . Urticaria   . Low back pain syndrome   . Hyperlipidemia   . Hypercholesterolemia   . Depression   . Tobacco abuse   . Myalgia and myositis, unspecified   . Bipolar 1 disorder   . Allergy   . Asthma     per pt stated  . Blood transfusion     several x  . Cataract     recent  surgery b/l  . Arthritis   . History of radiation therapy 04/11/12; 04/13/12; 04/15/12; 04/18/12; & 04/20/12    Rll lung 60Gy/5/fx  . GERD (gastroesophageal reflux disease)   . COPD (chronic obstructive pulmonary disease)     home o2 at 2l/min  . Shortness of breath     home o2 at 2l/min  . Complication of anesthesia     low oxygen  . PONV (postoperative nausea and vomiting)   . Fracture of humerus, proximal, left, closed 06/16/2013  . lung ca dx'd 11/2009    chemo comp 05/2010,home o2 at 2l/min  . Lung cancer     invasive left upper lobe adenocarcinoma    ALLERGIES:  is allergic to methocarbamol; morphine sulfate er beads; aspirin; avinza; clarithromycin; codeine; lyrica; oxycontin; phenergan; and levaquin.  MEDICATIONS:  Current Outpatient Prescriptions  Medication Sig Dispense Refill  . albuterol (PROVENTIL) (2.5 MG/3ML) 0.083% nebulizer solution Take 2.5 mg by nebulization every 6 (six) hours as needed for wheezing.    . clonazePAM (KLONOPIN) 1 MG tablet Take 1 mg by mouth at bedtime.     . clorazepate (TRANXENE) 3.75 MG tablet Take 3.75 mg by mouth 3 (three) times daily as needed for anxiety.    . CRESTOR 5 MG tablet Take 5 mg by mouth daily.    Marland Kitchen dexamethasone (DECADRON) 4 MG tablet 4 Milligram by mouth twice a day the day before, day of and day after the chemotherapy every three-week. 40 tablet 1  . divalproex (  DEPAKOTE) 250 MG DR tablet Take 750 mg by mouth at bedtime.    . folic acid (FOLVITE) 1 MG tablet Take 1 tablet (1 mg total) by mouth daily. 30 tablet 4  . hydroxychloroquine (PLAQUENIL) 200 MG tablet Take 200 mg by mouth 2 (two) times daily.     Marland Kitchen levothyroxine (SYNTHROID, LEVOTHROID) 25 MCG tablet Take 12.5 mcg by mouth daily before breakfast.    . oxyCODONE-acetaminophen (PERCOCET) 10-325 MG per tablet Take 1-2 tablets by mouth every 6 (six) hours as needed for pain.    Marland Kitchen prochlorperazine (COMPAZINE) 10 MG tablet Take 1 tablet (10 mg total) by mouth every 6 (six)  hours as needed for nausea or vomiting. 30 tablet 0  . triamcinolone cream (KENALOG) 0.1 % Apply 1 application topically 2 (two) times daily as needed (for itching).     . [DISCONTINUED] lisinopril-hydrochlorothiazide (PRINZIDE,ZESTORETIC) 10-12.5 MG per tablet Take 1 tablet by mouth.      No current facility-administered medications for this visit.    SURGICAL HISTORY:  Past Surgical History  Procedure Laterality Date  . Back surgery      x 4 L3-S1 1996,2000, 2002  . Vesicovaginal fistula closure w/ tah    . Appendectomy    . Bronchoscopy  2011  . Lung biopsy  2011    needle  . Tonsillectomy    . Abdominal hysterectomy      with b/l oophoorectomy age 29  . Lumbar fusion    . Orif humerus fracture Left 06/16/2013    Procedure: OPEN REDUCTION INTERNAL FIXATION (ORIF) LEFT SHOULDER;  Surgeon: Johnny Bridge, MD;  Location: [facility];  Service: Orthopedics;  Laterality: Left;    REVIEW OF SYSTEMS:  A comprehensive review of systems was negative except for: Respiratory: positive for dyspnea on exertion   PHYSICAL EXAMINATION: General appearance: alert, cooperative, fatigued and no distress Head: Normocephalic, without obvious abnormality, atraumatic Neck: no adenopathy Lymph nodes: Cervical, supraclavicular, and axillary nodes normal. Resp: clear to auscultation bilaterally Back: symmetric, no curvature. ROM normal. No CVA tenderness. Cardio: regular rate and rhythm, S1, S2 normal, no murmur, click, rub or gallop GI: soft, non-tender; bowel sounds normal; no masses,  no organomegaly Extremities: extremities normal, atraumatic, no cyanosis or edema Neurologic: Alert and oriented X 3, normal strength and tone. Normal symmetric reflexes. Normal coordination and gait  ECOG PERFORMANCE STATUS: 2 - Symptomatic, <50% confined to bed  There were no vitals taken for this visit.  LABORATORY DATA: Lab Results  Component Value Date   WBC 5.2 09/07/2014   HGB 12.2 09/07/2014   HCT 37.7  09/07/2014   MCV 97.8 09/07/2014   PLT 260 09/07/2014      Chemistry      Component Value Date/Time   NA 138 09/07/2014 1312   NA 145 06/28/2013 0443   NA 145 08/24/2011 1430   K 4.3 09/07/2014 1312   K 3.8 06/28/2013 0443   K 4.3 08/24/2011 1430   CL 101 06/28/2013 0443   CL 101 06/20/2012 1119   CL 95* 08/24/2011 1430   CO2 24 09/07/2014 1312   CO2 38* 06/28/2013 0443   CO2 31 08/24/2011 1430   BUN 7.0 09/07/2014 1312   BUN 18 06/28/2013 0443   BUN 15 08/24/2011 1430   CREATININE 0.7 09/07/2014 1312   CREATININE 0.59 06/28/2013 0443   CREATININE 0.8 08/24/2011 1430      Component Value Date/Time   CALCIUM 9.4 09/07/2014 1312   CALCIUM 7.8* 06/28/2013 0443  CALCIUM 8.6 08/24/2011 1430   ALKPHOS 60 09/07/2014 1312   ALKPHOS 71 08/24/2011 1430   ALKPHOS 98 06/28/2010 0600   AST 9 09/07/2014 1312   AST 15 08/24/2011 1430   AST 14 06/28/2010 0600   ALT <6 09/07/2014 1312   ALT 7* 08/24/2011 1430   ALT 10 06/28/2010 0600   BILITOT 0.25 09/07/2014 1312   BILITOT 0.50 08/24/2011 1430   BILITOT 0.4 06/28/2010 0600       RADIOGRAPHIC STUDIES: Ct Chest W Contrast  09/07/2014   CLINICAL DATA:  Restaging lung cancer. Chemotherapy complete January 2016.  EXAM: CT CHEST WITH CONTRAST  TECHNIQUE: Multidetector CT imaging of the chest was performed during intravenous contrast administration.  CONTRAST:  69mL OMNIPAQUE IOHEXOL 300 MG/ML  SOLN  COMPARISON:  CT 06/07/2014  FINDINGS: Mediastinum/Nodes: No axillary or supraclavicular lymphadenopathy. No mediastinal or hilar lymphadenopathy. No pericardial fluid. Esophagus is normal.  Lungs/Pleura: There is volume loss in the left hemi thorax. There is consolidation with bronchiectasis in the left upper lobe. This consolidative pattern is not changed from prior exam. No new nodularity. Centrilobular emphysema in the right upper lobe. 13 mm nodule in the superior segment of the right lower lobe is unchanged from 15 mm on prior remeasured.   Flattened nodule in the right lower lobe measures 22 x 21 mm (image 37, series 5) compared to 17 by 15 mm on prior. Carinal projection nodule looks punching (image 58, series 6 series) measuring 13 mm compared to 13 mm.  Upper abdomen: Adrenal glands are normal.  Musculoskeletal: Posterior thoracic fusion.  IMPRESSION: 1. Stable posttherapy change in the left upper lobe. 2. Stable irregular pulmonary nodules within the right lower lobe. 3. Emphysematous change.   Electronically Signed   By: Suzy Bouchard M.D.   On: 09/07/2014 15:41    ASSESSMENT AND PLAN: This is a very pleasant 64 years old white female with metastatic non-small cell lung cancer initially diagnosed as unresectable stage IIB non-small cell lung cancer status post concurrent chemoradiation followed by consolidation chemotherapy. The patient had evidence for disease progression he'll months ago and she was started on systemic chemotherapy with carboplatin and Alimta status post 6 cycles.  Her recent CT scan showed no evidence for disease progression. I discussed the scan results with the patient and her husband. I recommended for her to continue on observation for now with repeat CT scan of the chest in 3 months. The patient was advised to call immediately if she has any concerning symptoms in the interval.  All questions were answered. The patient knows to call the clinic with any problems, questions or concerns. We can certainly see the patient much sooner if necessary. The patient and her family agreed to the current plan.  Disclaimer: This note was dictated with voice recognition software. Similar sounding words can inadvertently be transcribed and may not be corrected upon review.

## 2014-12-11 ENCOUNTER — Ambulatory Visit (HOSPITAL_COMMUNITY)
Admission: RE | Admit: 2014-12-11 | Discharge: 2014-12-11 | Disposition: A | Discharge status: Home / Self Care | Payer: Medicare Other | Source: Ambulatory Visit | Attending: Internal Medicine | Admitting: Internal Medicine

## 2014-12-11 ENCOUNTER — Other Ambulatory Visit (HOSPITAL_BASED_OUTPATIENT_CLINIC_OR_DEPARTMENT_OTHER): Payer: Medicare Other

## 2014-12-11 ENCOUNTER — Encounter (HOSPITAL_COMMUNITY): Payer: Self-pay

## 2014-12-11 DIAGNOSIS — C7802 Secondary malignant neoplasm of left lung: Secondary | ICD-10-CM | POA: Diagnosis not present

## 2014-12-11 DIAGNOSIS — R079 Chest pain, unspecified: Secondary | ICD-10-CM | POA: Insufficient documentation

## 2014-12-11 DIAGNOSIS — R05 Cough: Secondary | ICD-10-CM | POA: Diagnosis not present

## 2014-12-11 DIAGNOSIS — J449 Chronic obstructive pulmonary disease, unspecified: Secondary | ICD-10-CM | POA: Insufficient documentation

## 2014-12-11 DIAGNOSIS — C7801 Secondary malignant neoplasm of right lung: Secondary | ICD-10-CM | POA: Insufficient documentation

## 2014-12-11 DIAGNOSIS — C3431 Malignant neoplasm of lower lobe, right bronchus or lung: Secondary | ICD-10-CM | POA: Insufficient documentation

## 2014-12-11 DIAGNOSIS — Z923 Personal history of irradiation: Secondary | ICD-10-CM | POA: Insufficient documentation

## 2014-12-11 DIAGNOSIS — R0602 Shortness of breath: Secondary | ICD-10-CM | POA: Diagnosis not present

## 2014-12-11 DIAGNOSIS — Z08 Encounter for follow-up examination after completed treatment for malignant neoplasm: Secondary | ICD-10-CM | POA: Insufficient documentation

## 2014-12-11 DIAGNOSIS — Z79899 Other long term (current) drug therapy: Secondary | ICD-10-CM | POA: Insufficient documentation

## 2014-12-11 LAB — COMPREHENSIVE METABOLIC PANEL (CC13)
ALBUMIN: 3.7 g/dL (ref 3.5–5.0)
ALT: <6 U/L (ref 0–55)
ANION GAP: 15 meq/L — AB (ref 3–11)
AST: 9 U/L (ref 5–34)
Alkaline Phosphatase: 65 U/L (ref 40–150)
BUN: 8.4 mg/dL (ref 7.0–26.0)
CALCIUM: 9.4 mg/dL (ref 8.4–10.4)
CHLORIDE: 104 meq/L (ref 98–109)
CO2: 22 meq/L (ref 22–29)
CREATININE: 0.7 mg/dL (ref 0.6–1.1)
EGFR: 84 mL/min/{1.73_m2} — ABNORMAL LOW (ref >90)
Glucose: 86 mg/dl (ref 70–140)
Potassium: 3.8 mEq/L (ref 3.5–5.1)
SODIUM: 141 meq/L (ref 136–145)
TOTAL PROTEIN: 6.7 g/dL (ref 6.4–8.3)
Total Bilirubin: 0.28 mg/dL (ref 0.20–1.20)

## 2014-12-11 LAB — CBC WITH DIFFERENTIAL/PLATELET
BASO%: 0.4 % (ref 0.0–2.0)
Basophils Absolute: 0 10*3/uL (ref 0.0–0.1)
EOS ABS: 0.2 10*3/uL (ref 0.0–0.5)
EOS%: 2.6 % (ref 0.0–7.0)
HCT: 37.7 % (ref 34.8–46.6)
HGB: 12.5 g/dL (ref 11.6–15.9)
LYMPH%: 21.6 % (ref 14.0–49.7)
MCH: 32.3 pg (ref 25.1–34.0)
MCHC: 33.2 g/dL (ref 31.5–36.0)
MCV: 97.4 fL (ref 79.5–101.0)
MONO#: 0.6 10*3/uL (ref 0.1–0.9)
MONO%: 8.1 % (ref 0.0–14.0)
NEUT%: 67.3 % (ref 38.4–76.8)
NEUTROS ABS: 5.3 10*3/uL (ref 1.5–6.5)
Platelets: 314 10*3/uL (ref 145–400)
RBC: 3.87 10*6/uL (ref 3.70–5.45)
RDW: 13.3 % (ref 11.2–14.5)
WBC: 7.8 10*3/uL (ref 3.9–10.3)
lymph#: 1.7 10*3/uL (ref 0.9–3.3)

## 2014-12-11 MED ORDER — IOHEXOL 300 MG/ML  SOLN
100.0000 mL | Freq: Once | INTRAMUSCULAR | Status: AC | PRN
  Administered 2014-12-11: 80 mL via INTRAVENOUS

## 2014-12-17 ENCOUNTER — Telehealth: Payer: Self-pay | Admitting: Internal Medicine

## 2014-12-17 ENCOUNTER — Ambulatory Visit: Payer: Medicare Other | Admitting: Internal Medicine

## 2014-12-17 NOTE — Telephone Encounter (Signed)
returned call and s.w. pt and r/s missed appt.Marland KitchenMarland KitchenMarland KitchenMarland Kitchenpt ok and aware

## 2015-01-15 ENCOUNTER — Encounter: Payer: Self-pay | Admitting: Internal Medicine

## 2015-01-15 ENCOUNTER — Ambulatory Visit (HOSPITAL_BASED_OUTPATIENT_CLINIC_OR_DEPARTMENT_OTHER): Payer: Medicare Other | Admitting: Internal Medicine

## 2015-01-15 ENCOUNTER — Telehealth: Payer: Self-pay | Admitting: *Deleted

## 2015-01-15 ENCOUNTER — Telehealth: Payer: Self-pay | Admitting: Internal Medicine

## 2015-01-15 VITALS — BP 84/58 | HR 104 | Temp 98.6°F | Resp 17 | Ht 63.0 in | Wt 132.2 lb

## 2015-01-15 DIAGNOSIS — E039 Hypothyroidism, unspecified: Secondary | ICD-10-CM | POA: Insufficient documentation

## 2015-01-15 DIAGNOSIS — C3431 Malignant neoplasm of lower lobe, right bronchus or lung: Secondary | ICD-10-CM

## 2015-01-15 DIAGNOSIS — R530 Neoplastic (malignant) related fatigue: Secondary | ICD-10-CM

## 2015-01-15 NOTE — Telephone Encounter (Signed)
per pof to sch pt appt-gave pt copy of avs-sent MW email to sch trmt-will call pt after reply

## 2015-01-15 NOTE — Telephone Encounter (Signed)
Per staff message and POF I have scheduled appts. Advised scheduler of appts. JMW  

## 2015-01-15 NOTE — Progress Notes (Signed)
[facility] [place] [phone]   [phone]  OFFICE PROGRESS NOTE  [place] Frees, [place] [id number]  PRINCIPAL DIAGNOSIS: Metastatic non-small cell lung cancer initially diagnosed as Unresectable stage IIB (T3 N0 M0) non-small cell lung cancer, adenocarcinoma diagnosed in June 2011.   PRIOR THERAPY:  1. Status post concurrent chemoradiation with weekly carboplatin and paclitaxel. Last dose was given February 24, 2010. 2. Consolidation chemotherapy with carboplatin and Alimta, status post 3 cycles. Last dose was given June 09, 2010. 3. Curative radiotherapy to a right lower lobe nodule under the care of Dr. Pablo Ledger completed 04/20/2012. 4. Systemic chemotherapy with carboplatin for AUC of 5 and Alimta 500 mg/M2 every 3 weeks. First dose 01/24/2014. Status post 6 cycles with partial response.  CURRENT THERAPY: Immunotherapy with Nivolumab 3 MG/KG every 2 weeks. First cycle expected on 01/22/2015  INTERVAL HISTORY: Maria Lucero 64 y.o. female returns to the clinic today for followup visit accompanied her husband. She completed 6 cycles of systemic chemotherapy with carboplatin and Alimta with partial response. The patient has been on observation for the last 10 months. She has been complaining of increasing fatigue and weakness as well as worsening dyspnea over the last few weeks. She denied having any significant chest pain, cough or hemoptysis. She has no fever or chills, no nausea or vomiting. She had repeat CT scan of the chest performed a few weeks ago and she is here for evaluation and discussion of her scan results.   MEDICAL HISTORY: Past Medical History  Diagnosis Date  . Migraine headache   . Anxiety   . Urticaria   . Low back pain syndrome   . Hyperlipidemia   . Hypercholesterolemia   . Depression   . Tobacco abuse   . Myalgia and myositis, unspecified   . Bipolar 1 disorder   . Allergy   .  Asthma     per pt stated  . Blood transfusion     several x  . Cataract     recent surgery b/l  . Arthritis   . History of radiation therapy 04/11/12; 04/13/12; 04/15/12; 04/18/12; & 04/20/12    Rll lung 60Gy/5/fx  . GERD (gastroesophageal reflux disease)   . COPD (chronic obstructive pulmonary disease)     home o2 at 2l/min  . Shortness of breath     home o2 at 2l/min  . Complication of anesthesia     low oxygen  . PONV (postoperative nausea and vomiting)   . Fracture of humerus, proximal, left, closed 06/16/2013  . lung ca dx'd 11/2009    chemo comp 05/2010,home o2 at 2l/min  . Lung cancer     invasive left upper lobe adenocarcinoma    ALLERGIES:  is allergic to methocarbamol; morphine sulfate er beads; aspirin; avinza; clarithromycin; codeine; lyrica; oxycontin; phenergan; and levaquin.  MEDICATIONS:  Current Outpatient Prescriptions  Medication Sig Dispense Refill  . albuterol (PROVENTIL) (2.5 MG/3ML) 0.083% nebulizer solution Take 2.5 mg by nebulization every 6 (six) hours as needed for wheezing.    . clonazePAM (KLONOPIN) 1 MG tablet Take 1 mg by mouth at bedtime.     . clorazepate (TRANXENE) 3.75 MG tablet Take 3.75 mg by mouth 3 (three) times daily as needed for anxiety.    . CRESTOR 5 MG tablet Take 5 mg by mouth daily.    Marland Kitchen dexamethasone (DECADRON) 4 MG tablet 4 Milligram by mouth twice a day the day before,  day of and day after the chemotherapy every three-week. 40 tablet 1  . divalproex (DEPAKOTE) 250 MG DR tablet Take 750 mg by mouth at bedtime.    . folic acid (FOLVITE) 1 MG tablet Take 1 tablet (1 mg total) by mouth daily. 30 tablet 4  . hydroxychloroquine (PLAQUENIL) 200 MG tablet Take 200 mg by mouth 2 (two) times daily.     Marland Kitchen levothyroxine (SYNTHROID, LEVOTHROID) 25 MCG tablet Take 12.5 mcg by mouth daily before breakfast.    . oxyCODONE-acetaminophen (PERCOCET) 10-325 MG per tablet Take 1-2 tablets by mouth every 6 (six) hours as needed for pain.    Marland Kitchen  prochlorperazine (COMPAZINE) 10 MG tablet Take 1 tablet (10 mg total) by mouth every 6 (six) hours as needed for nausea or vomiting. 30 tablet 0  . Sennosides-Docusate Sodium (STOOL SOFTENER & LAXATIVE PO) Take 1 tablet by mouth daily.    Marland Kitchen triamcinolone cream (KENALOG) 0.1 % Apply 1 application topically 2 (two) times daily as needed (for itching).     . [DISCONTINUED] lisinopril-hydrochlorothiazide (PRINZIDE,ZESTORETIC) 10-12.5 MG per tablet Take 1 tablet by mouth.      No current facility-administered medications for this visit.    SURGICAL HISTORY:  Past Surgical History  Procedure Laterality Date  . Back surgery      x 4 L3-S1 1996,2000, 2002  . Vesicovaginal fistula closure w/ tah    . Appendectomy    . Bronchoscopy  2011  . Lung biopsy  2011    needle  . Tonsillectomy    . Abdominal hysterectomy      with b/l oophoorectomy age 55  . Lumbar fusion    . Orif humerus fracture Left 06/16/2013    Procedure: OPEN REDUCTION INTERNAL FIXATION (ORIF) LEFT SHOULDER;  Surgeon: Johnny Bridge, MD;  Location: [facility];  Service: Orthopedics;  Laterality: Left;    REVIEW OF SYSTEMS:  Constitutional: positive for anorexia and fatigue Eyes: negative Ears, nose, mouth, throat, and face: negative Respiratory: positive for cough and dyspnea on exertion Cardiovascular: negative Gastrointestinal: negative Genitourinary:negative Integument/breast: negative Hematologic/lymphatic: negative Musculoskeletal:negative Neurological: negative Behavioral/Psych: negative Endocrine: negative Allergic/Immunologic: negative   PHYSICAL EXAMINATION: General appearance: alert, cooperative, fatigued and no distress Head: Normocephalic, without obvious abnormality, atraumatic Neck: no adenopathy Lymph nodes: Cervical, supraclavicular, and axillary nodes normal. Resp: clear to auscultation bilaterally Back: symmetric, no curvature. ROM normal. No CVA tenderness. Cardio: regular rate and rhythm, S1, S2  normal, no murmur, click, rub or gallop GI: soft, non-tender; bowel sounds normal; no masses,  no organomegaly Extremities: extremities normal, atraumatic, no cyanosis or edema Neurologic: Alert and oriented X 3, normal strength and tone. Normal symmetric reflexes. Normal coordination and gait  ECOG PERFORMANCE STATUS: 2 - Symptomatic, <50% confined to bed  Blood pressure 84/58, pulse 104, temperature 98.6 F (37 C), temperature source Oral, resp. rate 17, height '5\' 3"'$  (1.6 m), weight 132 lb 3.2 oz (59.966 kg), SpO2 100 %.  LABORATORY DATA: Lab Results  Component Value Date   WBC 7.8 12/11/2014   HGB 12.5 12/11/2014   HCT 37.7 12/11/2014   MCV 97.4 12/11/2014   PLT 314 12/11/2014      Chemistry      Component Value Date/Time   NA 141 12/11/2014 1303   NA 145 06/28/2013 0443   NA 145 08/24/2011 1430   K 3.8 12/11/2014 1303   K 3.8 06/28/2013 0443   K 4.3 08/24/2011 1430   CL 101 06/28/2013 0443   CL 101 06/20/2012 1119  CL 95* 08/24/2011 1430   CO2 22 12/11/2014 1303   CO2 38* 06/28/2013 0443   CO2 31 08/24/2011 1430   BUN 8.4 12/11/2014 1303   BUN 18 06/28/2013 0443   BUN 15 08/24/2011 1430   CREATININE 0.7 12/11/2014 1303   CREATININE 0.59 06/28/2013 0443   CREATININE 0.8 08/24/2011 1430      Component Value Date/Time   CALCIUM 9.4 12/11/2014 1303   CALCIUM 7.8* 06/28/2013 0443   CALCIUM 8.6 08/24/2011 1430   ALKPHOS 65 12/11/2014 1303   ALKPHOS 71 08/24/2011 1430   ALKPHOS 98 06/28/2010 0600   AST 9 12/11/2014 1303   AST 15 08/24/2011 1430   AST 14 06/28/2010 0600   ALT <6 12/11/2014 1303   ALT 7* 08/24/2011 1430   ALT 10 06/28/2010 0600   BILITOT 0.28 12/11/2014 1303   BILITOT 0.50 08/24/2011 1430   BILITOT 0.4 06/28/2010 0600       RADIOGRAPHIC STUDIES: Ct Chest W Contrast  12/11/2014   CLINICAL DATA:  Lung cancer diagnosed in 2011 with metastasis to both lungs in 2015. Radiation therapy complete in 2013. chemotherapy in progress. Chest pain.  Cough. Shortness of breath. COPD. Right lower lobe primary. Restaging.  EXAM: CT CHEST WITH CONTRAST  TECHNIQUE: Multidetector CT imaging of the chest was performed during intravenous contrast administration.  CONTRAST:  59m OMNIPAQUE IOHEXOL 300 MG/ML  SOLN  COMPARISON:  09/07/2014  FINDINGS: Mediastinum/Nodes: Similar appearance of small right jugular nodes, not pathologic by size criteria. Right Port-A-Cath which terminates at the high right atrium. Normal heart size with trace pericardial fluid, likely physiologic. No central pulmonary embolism, on this non-dedicated study. No mediastinal or hilar adenopathy.  Lungs/Pleura: Left-sided pleural thickening is similar. Advanced centrilobular emphysema.  Right upper lobe pleural-based nodule is either new or more well-defined today. 9 x 5 mm on image 25.  Nodules along the right minor fissure are slightly enlarged. Example 6 mm nodule laterally today at the site of 1-2 mm nodules on the prior.  Superior segment right lower lobe spiculated nodule measures 1.2 x 1.7 cm on image 28 versus 0.9 x 1.3 cm on the prior.  Increased irregular right lower lobe opacity, measuring 2.7 x 4.0 cm on image 35 today versus 2.1 x 2.3 cm on the prior.  Right lower lobe 9 mm nodule is increased from 4 mm on the prior. Image 40 today.  Similar appearance of left apical and upper lung consolidation and bronchiectasis, likely radiation induced. No well-defined residual or recurrent mass in this area.  Upper abdomen: Beam hardening artifact degradation secondary to spine fixation. Grossly normal imaged portions of the liver, spleen, stomach, pancreas, adrenal glands, kidneys.  Musculoskeletal: Plate and screw fixation of the proximal left humerus. Left thoracotomy versus remote posttraumatic changes about upper left ribs. Lower thoracic spine fixation. At least T10 through L2. Similar appearance of the T10 right-sided screw extending into the intervertebral disc. Mild accentuation of  expected thoracic kyphosis with presumed degenerative sclerosis about the T8-9 level.  IMPRESSION: 1. Significant enlargement of multiple right-sided pulmonary nodules, consistent with disease progression. 2. Similar appearance of presumed radiation change in the left apex and upper lung. 3. No thoracic adenopathy.   Electronically Signed   By: KAbigail MiyamotoM.D.   On: 12/11/2014 16:07   ASSESSMENT AND PLAN: This is a very pleasant 64years old white female with metastatic non-small cell lung cancer initially diagnosed as unresectable stage IIB non-small cell lung cancer status post concurrent chemoradiation followed by  consolidation chemotherapy. The patient had evidence for disease progression and she was started on systemic chemotherapy with carboplatin and Alimta status post 6 cycles. She has been observation for around 10 months but unfortunately the recent CT scan of the chest showed evidence for disease progression. I discussed the scan results with the patient and her husband. I The patient the option of palliative care and hospice referral versus consideration of second line treatment with immunotherapy with Nivolumab 3 MG/KG every 2 weeks. I discussed with the patient adverse effect of the immunotherapy including but not limited to immune mediated skin rash, diarrhea, pneumonitis, liver, renal, thyroid or other endocrine dysfunction. The patient is not interested on hospice at this point and would like to try the immunotherapy. She is expected to start the first cycle of this treatment next week. She will come back for follow-up visit in 3 weeks for reevaluation before starting cycle #2. The patient was advised to call immediately if she has any concerning symptoms in the interval.  All questions were answered. The patient knows to call the clinic with any problems, questions or concerns. We can certainly see the patient much sooner if necessary. The patient and her family agreed to the current  plan.  Disclaimer: This note was dictated with voice recognition software. Similar sounding words can inadvertently be transcribed and may not be corrected upon review.

## 2015-01-16 ENCOUNTER — Telehealth: Payer: Self-pay | Admitting: Nurse Practitioner

## 2015-01-16 NOTE — Telephone Encounter (Signed)
cld & spoke to pt & adv of time & date of appts-*pt understood

## 2015-01-21 ENCOUNTER — Other Ambulatory Visit: Payer: Self-pay | Admitting: Medical Oncology

## 2015-01-21 DIAGNOSIS — C3430 Malignant neoplasm of lower lobe, unspecified bronchus or lung: Secondary | ICD-10-CM

## 2015-01-22 ENCOUNTER — Ambulatory Visit (HOSPITAL_BASED_OUTPATIENT_CLINIC_OR_DEPARTMENT_OTHER): Payer: Medicare Other

## 2015-01-22 ENCOUNTER — Other Ambulatory Visit (HOSPITAL_BASED_OUTPATIENT_CLINIC_OR_DEPARTMENT_OTHER): Payer: Medicare Other

## 2015-01-22 VITALS — BP 128/63 | HR 92 | Temp 97.1°F | Resp 20

## 2015-01-22 DIAGNOSIS — C3431 Malignant neoplasm of lower lobe, right bronchus or lung: Secondary | ICD-10-CM

## 2015-01-22 DIAGNOSIS — Z5112 Encounter for antineoplastic immunotherapy: Secondary | ICD-10-CM

## 2015-01-22 DIAGNOSIS — C3430 Malignant neoplasm of lower lobe, unspecified bronchus or lung: Secondary | ICD-10-CM

## 2015-01-22 DIAGNOSIS — R530 Neoplastic (malignant) related fatigue: Secondary | ICD-10-CM

## 2015-01-22 DIAGNOSIS — Z79899 Other long term (current) drug therapy: Secondary | ICD-10-CM | POA: Diagnosis not present

## 2015-01-22 DIAGNOSIS — E039 Hypothyroidism, unspecified: Secondary | ICD-10-CM

## 2015-01-22 LAB — COMPREHENSIVE METABOLIC PANEL (CC13)
ALK PHOS: 56 U/L (ref 40–150)
ALT: <6 U/L (ref 0–55)
ANION GAP: 10 meq/L (ref 3–11)
AST: 9 U/L (ref 5–34)
Albumin: 3.7 g/dL (ref 3.5–5.0)
BUN: 10.7 mg/dL (ref 7.0–26.0)
CO2: 24 meq/L (ref 22–29)
Calcium: 9.5 mg/dL (ref 8.4–10.4)
Chloride: 104 mEq/L (ref 98–109)
Creatinine: 0.7 mg/dL (ref 0.6–1.1)
EGFR: 87 mL/min/{1.73_m2} — ABNORMAL LOW (ref >90)
GLUCOSE: 90 mg/dL (ref 70–140)
POTASSIUM: 3.7 meq/L (ref 3.5–5.1)
SODIUM: 139 meq/L (ref 136–145)
Total Bilirubin: 0.32 mg/dL (ref 0.20–1.20)
Total Protein: 6.9 g/dL (ref 6.4–8.3)

## 2015-01-22 LAB — CBC WITH DIFFERENTIAL/PLATELET
BASO%: 1 % (ref 0.0–2.0)
Basophils Absolute: 0 10*3/uL (ref 0.0–0.1)
EOS%: 4.4 % (ref 0.0–7.0)
Eosinophils Absolute: 0.2 10*3/uL (ref 0.0–0.5)
HEMATOCRIT: 36.1 % (ref 34.8–46.6)
HGB: 11.9 g/dL (ref 11.6–15.9)
LYMPH%: 38.6 % (ref 14.0–49.7)
MCH: 31.4 pg (ref 25.1–34.0)
MCHC: 33.1 g/dL (ref 31.5–36.0)
MCV: 94.9 fL (ref 79.5–101.0)
MONO#: 0.4 10*3/uL (ref 0.1–0.9)
MONO%: 9.4 % (ref 0.0–14.0)
NEUT%: 46.6 % (ref 38.4–76.8)
NEUTROS ABS: 2.1 10*3/uL (ref 1.5–6.5)
PLATELETS: 354 10*3/uL (ref 145–400)
RBC: 3.8 10*6/uL (ref 3.70–5.45)
RDW: 13.8 % (ref 11.2–14.5)
WBC: 4.6 10*3/uL (ref 3.9–10.3)
lymph#: 1.8 10*3/uL (ref 0.9–3.3)

## 2015-01-22 LAB — TSH CHCC: TSH: 3.344 m(IU)/L (ref 0.308–3.960)

## 2015-01-22 MED ORDER — HEPARIN SOD (PORK) LOCK FLUSH 100 UNIT/ML IV SOLN
500.0000 [IU] | Freq: Once | INTRAVENOUS | Status: AC | PRN
  Administered 2015-01-22: 500 [IU]
  Filled 2015-01-22: qty 5

## 2015-01-22 MED ORDER — SODIUM CHLORIDE 0.9 % IV SOLN
3.0000 mg/kg | Freq: Once | INTRAVENOUS | Status: AC
  Administered 2015-01-22: 180 mg via INTRAVENOUS
  Filled 2015-01-22: qty 18

## 2015-01-22 MED ORDER — SODIUM CHLORIDE 0.9 % IV SOLN
Freq: Once | INTRAVENOUS | Status: AC
  Administered 2015-01-22: 14:00:00 via INTRAVENOUS

## 2015-01-22 MED ORDER — SODIUM CHLORIDE 0.9 % IJ SOLN
10.0000 mL | INTRAMUSCULAR | Status: DC | PRN
Start: 2015-01-22 — End: 2015-01-22
  Administered 2015-01-22: 10 mL
  Filled 2015-01-22: qty 10

## 2015-01-22 NOTE — Patient Instructions (Signed)
[facility] Discharge Instructions for Patients Receiving Chemotherapy  Today you received the following chemotherapy agents: Nivolumab   To help prevent nausea and vomiting after your treatment, we encourage you to take your nausea medication as prescribed by your physician.   If you develop nausea and vomiting that is not controlled by your nausea medication, call the clinic.   BELOW ARE SYMPTOMS THAT SHOULD BE REPORTED IMMEDIATELY:  *FEVER GREATER THAN 100.5 F  *CHILLS WITH OR WITHOUT FEVER  NAUSEA AND VOMITING THAT IS NOT CONTROLLED WITH YOUR NAUSEA MEDICATION  *UNUSUAL SHORTNESS OF BREATH  *UNUSUAL BRUISING OR BLEEDING  TENDERNESS IN MOUTH AND THROAT WITH OR WITHOUT PRESENCE OF ULCERS  *URINARY PROBLEMS  *BOWEL PROBLEMS  UNUSUAL RASH Items with * indicate a potential emergency and should be followed up as soon as possible.  Feel free to call the clinic you have any questions or concerns. The clinic phone number is [phone].  Please show the [facility] at check-in to the Emergency Department and triage nurse.  Nivolumab injection What is this medicine? NIVOLUMAB (nye VOL ue mab) is used to treat certain types of melanoma and lung cancer. This medicine may be used for other purposes; ask your health care provider or pharmacist if you have questions. COMMON BRAND NAME(S): Opdivo What should I tell my health care provider before I take this medicine? They need to know if you have any of these conditions: -eye disease, vision problems -history of pancreatitis -immune system problems -inflammatory bowel disease -kidney disease -liver disease -lung disease -lupus -myasthenia gravis -multiple sclerosis -organ transplant -stomach or intestine problems -thyroid disease -tingling of the fingers or toes, or other nerve disorder -an unusual or allergic reaction to nivolumab, other medicines, foods, dyes, or preservatives -pregnant or trying to  get pregnant -breast-feeding How should I use this medicine? This medicine is for infusion into a vein. It is given by a health care professional in a hospital or clinic setting. A special MedGuide will be given to you before each treatment. Be sure to read this information carefully each time. Talk to your pediatrician regarding the use of this medicine in children. Special care may be needed. Overdosage: If you think you've taken too much of this medicine contact a poison control center or emergency room at once. Overdosage: If you think you have taken too much of this medicine contact a poison control center or emergency room at once. NOTE: This medicine is only for you. Do not share this medicine with others. What if I miss a dose? It is important not to miss your dose. Call your doctor or health care professional if you are unable to keep an appointment. What may interact with this medicine? Interactions have not been studied. This list may not describe all possible interactions. Give your health care provider a list of all the medicines, herbs, non-prescription drugs, or dietary supplements you use. Also tell them if you smoke, drink alcohol, or use illegal drugs. Some items may interact with your medicine. What should I watch for while using this medicine? Tell your doctor or healthcare professional if your symptoms do not start to get better or if they get worse. Your condition will be monitored carefully while you are receiving this medicine. You may need blood work done while you are taking this medicine. What side effects may I notice from receiving this medicine? Side effects that you should report to your doctor or health care professional as soon as possible: -  allergic reactions like skin rash, itching or hives, swelling of the face, lips, or tongue -black, tarry stools -bloody or watery diarrhea -changes in vision -chills -cough -depressed mood -eye pain -feeling  anxious -fever -general ill feeling or flu-like symptoms -hair loss -loss of appetite -low blood counts - this medicine may decrease the number of white blood cells, red blood cells and platelets. You may be at increased risk for infections and bleeding -pain, tingling, numbness in the hands or feet -redness, blistering, peeling or loosening of the skin, including inside the mouth -red pinpoint spots on skin -signs of decreased platelets or bleeding - bruising, pinpoint red spots on the skin, black, tarry stools, blood in the urine -signs of decreased red blood cells - unusually weak or tired, feeling faint or lightheaded, falls -signs of infection - fever or chills, cough, sore throat, pain or trouble passing urine -signs and symptoms of a dangerous change in heartbeat or heart rhythm like chest pain; dizziness; fast or irregular heartbeat; palpitations; feeling faint or lightheaded, falls; breathing problems -signs and symptoms of high blood sugar such as dizziness; dry mouth; dry skin; fruity breath; nausea; stomach pain; increased hunger or thirst; increased urination -signs and symptoms of kidney injury like trouble passing urine or change in the amount of urine -signs and symptoms of liver injury like dark yellow or brown urine; general ill feeling or flu-like symptoms; light-colored stools; loss of appetite; nausea; right upper belly pain; unusually weak or tired; yellowing of the eyes or skin -signs and symptoms of increased potassium like muscle weakness; chest pain; or fast, irregular heartbeat -signs and symptoms of low potassium like muscle cramps or muscle pain; chest pain; dizziness; feeling faint or lightheaded, falls; palpitations; breathing problems; or fast, irregular heartbeat -swelling of the ankles, feet, hands -weight gainSide effects that usually do not require medical attention (report to your doctor or health care professional if they continue or are  bothersome): -constipation -general ill feeling or flu-like symptoms -hair loss -loss of appetite -nausea, vomiting This list may not describe all possible side effects. Call your doctor for medical advice about side effects. You may report side effects to FDA at 1-800-FDA-1088. Where should I keep my medicine? This drug is given in a hospital or clinic and will not be stored at home. NOTE: This sheet is a summary. It may not cover all possible information. If you have questions about this medicine, talk to your doctor, pharmacist, or health care provider.  2015, Elsevier/Gold Standard. (2013-09-04 13:18:19)

## 2015-01-22 NOTE — Progress Notes (Signed)
Pt tolerated 1st nivolumab without difficulty

## 2015-01-23 ENCOUNTER — Telehealth: Payer: Self-pay

## 2015-01-23 NOTE — Telephone Encounter (Signed)
Sleeping off an on all day. Has been nauseated, has been using compazine, it is the same as prior to nivolumab. She has not felt like eating. She has had only 1 glass of water today. Encouraged her to drink up to 64 oz water per day. Told her to call tomorrow or Friday if she is unable to drink much water. We watch for dehydration. Has been hurting really bad all over. Her history if she sits up a lot all day she "pays for it the next day", she has history of RA. Encouraged her to call if any problems or she feels worse tomorrow. She denies constipation.

## 2015-01-23 NOTE — Telephone Encounter (Signed)
-----   Message from Adalberto Cole, RN sent at 01/22/2015  3:35 PM EDT ----- Regarding: Dr. Rubye Beach follow up 1st Nivolumab, tolerated well

## 2015-01-24 NOTE — Telephone Encounter (Signed)
F/u call back after nivolumab. Pt stated "i feel washed out . My husband said I look better today". She is eating and drinking and thinks she is better today, just tired.  I told her it may be r/t compazine but she said it never makes me feel like this.  i told her to call for worsening symptoms.

## 2015-02-05 ENCOUNTER — Other Ambulatory Visit (HOSPITAL_BASED_OUTPATIENT_CLINIC_OR_DEPARTMENT_OTHER): Payer: Medicare Other

## 2015-02-05 ENCOUNTER — Telehealth: Payer: Self-pay | Admitting: Internal Medicine

## 2015-02-05 ENCOUNTER — Ambulatory Visit (HOSPITAL_BASED_OUTPATIENT_CLINIC_OR_DEPARTMENT_OTHER): Payer: Medicare Other | Admitting: Nurse Practitioner

## 2015-02-05 ENCOUNTER — Ambulatory Visit (HOSPITAL_BASED_OUTPATIENT_CLINIC_OR_DEPARTMENT_OTHER): Payer: Medicare Other

## 2015-02-05 ENCOUNTER — Other Ambulatory Visit: Payer: Medicare Other

## 2015-02-05 VITALS — BP 94/56 | HR 92 | Temp 98.4°F | Resp 17 | Ht 63.0 in | Wt 128.1 lb

## 2015-02-05 DIAGNOSIS — C3430 Malignant neoplasm of lower lobe, unspecified bronchus or lung: Secondary | ICD-10-CM

## 2015-02-05 DIAGNOSIS — C3431 Malignant neoplasm of lower lobe, right bronchus or lung: Secondary | ICD-10-CM

## 2015-02-05 DIAGNOSIS — Z5112 Encounter for antineoplastic immunotherapy: Secondary | ICD-10-CM | POA: Diagnosis not present

## 2015-02-05 LAB — COMPREHENSIVE METABOLIC PANEL (CC13)
ALBUMIN: 3.5 g/dL (ref 3.5–5.0)
ALT: <6 U/L (ref 0–55)
AST: 10 U/L (ref 5–34)
Alkaline Phosphatase: 55 U/L (ref 40–150)
Anion Gap: 9 mEq/L (ref 3–11)
BUN: 9.1 mg/dL (ref 7.0–26.0)
CALCIUM: 9.5 mg/dL (ref 8.4–10.4)
CO2: 28 mEq/L (ref 22–29)
Chloride: 105 mEq/L (ref 98–109)
Creatinine: 0.7 mg/dL (ref 0.6–1.1)
EGFR: >90 mL/min/{1.73_m2} (ref >90)
Glucose: 84 mg/dl (ref 70–140)
POTASSIUM: 3.7 meq/L (ref 3.5–5.1)
Sodium: 142 mEq/L (ref 136–145)
Total Bilirubin: 0.23 mg/dL (ref 0.20–1.20)
Total Protein: 6.5 g/dL (ref 6.4–8.3)

## 2015-02-05 LAB — CBC WITH DIFFERENTIAL/PLATELET
BASO%: 1 % (ref 0.0–2.0)
Basophils Absolute: 0 10*3/uL (ref 0.0–0.1)
EOS ABS: 0.1 10*3/uL (ref 0.0–0.5)
EOS%: 2.5 % (ref 0.0–7.0)
HCT: 34.6 % — ABNORMAL LOW (ref 34.8–46.6)
HEMOGLOBIN: 11.3 g/dL — AB (ref 11.6–15.9)
LYMPH%: 30 % (ref 14.0–49.7)
MCH: 31.2 pg (ref 25.1–34.0)
MCHC: 32.7 g/dL (ref 31.5–36.0)
MCV: 95.4 fL (ref 79.5–101.0)
MONO#: 0.5 10*3/uL (ref 0.1–0.9)
MONO%: 11 % (ref 0.0–14.0)
NEUT#: 2.6 10*3/uL (ref 1.5–6.5)
NEUT%: 55.5 % (ref 38.4–76.8)
Platelets: 368 10*3/uL (ref 145–400)
RBC: 3.63 10*6/uL — AB (ref 3.70–5.45)
RDW: 13.5 % (ref 11.2–14.5)
WBC: 4.7 10*3/uL (ref 3.9–10.3)
lymph#: 1.4 10*3/uL (ref 0.9–3.3)

## 2015-02-05 MED ORDER — HEPARIN SOD (PORK) LOCK FLUSH 100 UNIT/ML IV SOLN
500.0000 [IU] | Freq: Once | INTRAVENOUS | Status: AC | PRN
  Administered 2015-02-05: 500 [IU]
  Filled 2015-02-05: qty 5

## 2015-02-05 MED ORDER — ONDANSETRON HCL 8 MG PO TABS: 8.0000 mg | ORAL_TABLET | Freq: Two times a day (BID) | ORAL | Status: DC | PRN

## 2015-02-05 MED ORDER — SODIUM CHLORIDE 0.9 % IV SOLN
3.0000 mg/kg | Freq: Once | INTRAVENOUS | Status: AC
  Administered 2015-02-05: 180 mg via INTRAVENOUS
  Filled 2015-02-05: qty 18

## 2015-02-05 MED ORDER — SODIUM CHLORIDE 0.9 % IV SOLN
Freq: Once | INTRAVENOUS | Status: AC
  Administered 2015-02-05: 13:00:00 via INTRAVENOUS

## 2015-02-05 MED ORDER — SODIUM CHLORIDE 0.9 % IJ SOLN
10.0000 mL | INTRAMUSCULAR | Status: DC | PRN
Start: 2015-02-05 — End: 2015-02-05
  Administered 2015-02-05: 10 mL
  Filled 2015-02-05: qty 10

## 2015-02-05 MED ORDER — SODIUM CHLORIDE 0.9 % IV SOLN
Freq: Once | INTRAVENOUS | Status: AC
  Administered 2015-02-05: 15:00:00 via INTRAVENOUS
  Filled 2015-02-05: qty 4

## 2015-02-05 NOTE — Patient Instructions (Signed)
[facility] Discharge Instructions for Patients Receiving Chemotherapy  Today you received the following chemotherapy agents Nivolumab.  To help prevent nausea and vomiting after your treatment, we encourage you to take your nausea medication as prescribed.   If you develop nausea and vomiting that is not controlled by your nausea medication, call the clinic.   BELOW ARE SYMPTOMS THAT SHOULD BE REPORTED IMMEDIATELY:  *FEVER GREATER THAN 100.5 F  *CHILLS WITH OR WITHOUT FEVER  NAUSEA AND VOMITING THAT IS NOT CONTROLLED WITH YOUR NAUSEA MEDICATION  *UNUSUAL SHORTNESS OF BREATH  *UNUSUAL BRUISING OR BLEEDING  TENDERNESS IN MOUTH AND THROAT WITH OR WITHOUT PRESENCE OF ULCERS  *URINARY PROBLEMS  *BOWEL PROBLEMS  UNUSUAL RASH Items with * indicate a potential emergency and should be followed up as soon as possible.  Feel free to call the clinic you have any questions or concerns. The clinic phone number is [phone].  Please show the CHEMO ALERT CARD at check-in to the Emergency Department and triage nurse.   

## 2015-02-05 NOTE — Telephone Encounter (Signed)
Gave and printed appt sched and avs fo rpt for Aug and Sept

## 2015-02-05 NOTE — Progress Notes (Signed)
  [facility] OFFICE PROGRESS NOTE   PRINCIPAL DIAGNOSIS: Metastatic non-small cell lung cancer initially diagnosed as Unresectable stage IIB (T3 N0 M0) non-small cell lung cancer, adenocarcinoma diagnosed in June 2011.   PRIOR THERAPY:  1. Status post concurrent chemoradiation with weekly carboplatin and paclitaxel. Last dose was given February 24, 2010. 2. Consolidation chemotherapy with carboplatin and Alimta, status post 3 cycles. Last dose was given June 09, 2010. 3. Curative radiotherapy to a right lower lobe nodule under the care of Dr. Pablo Ledger completed 04/20/2012. 4. Systemic chemotherapy with carboplatin for AUC of 5 and Alimta 500 mg/M2 every 3 weeks. First dose 01/24/2014. Status post 6 cycles with partial response.  CURRENT THERAPY: Immunotherapy with Nivolumab 3 MG/KG every 2 weeks. First cycle 01/22/2015   INTERVAL HISTORY:   Ms. Rouillard returns as scheduled. She completed cycle 1 nivolumab 01/22/2015. She has persistent nausea which predated nivolumab. She takes Compazine but notes it is minimally effective. She would like a different antiemetic. She denies diarrhea. No rash. She has stable dyspnea on exertion. She has a stable productive cough. No fever. Appetite varies. She is having difficulty sleeping.  Objective:  Vital signs in last 24 hours:  Blood pressure 94/56, pulse 92, temperature 98.4 F (36.9 C), temperature source Oral, resp. rate 17, height '5\' 3"'$  (1.6 m), weight 128 lb 1.6 oz (58.106 kg), SpO2 98 %.    HEENT: No thrush or ulcers. Lymphatics: No palpable cervical or supraclavicular lymph nodes. Resp: Distant breath sounds. Cardio: Regular rate and rhythm. GI: Abdomen soft. No hepatomegaly. Vascular: No leg edema. Calves soft and nontender. Skin: No rash.    Lab Results:  Lab Results  Component Value Date   WBC 4.7 02/05/2015   HGB 11.3* 02/05/2015   HCT 34.6* 02/05/2015   MCV 95.4 02/05/2015   PLT 368 02/05/2015   NEUTROABS  2.6 02/05/2015    Imaging:  No results found.  Medications: I have reviewed the patient's current medications.  Assessment/Plan: 1. Metastatic non-small cell lung cancer initially diagnosed as unresectable stage IIB non-small cell lung cancer status post concurrent chemoradiation followed by consolidation chemotherapy. Restaging CT evaluation July 2015 showed increased size of several right lung pulmonary metastases. She completed 6 cycles of carboplatin and Alimta 01/24/2014 through 05/16/2014. Restaging CT evaluation June 2016 showed significant enlargement of multiple right-sided pulmonary nodules consistent with disease progression. Treatment initiated with every 2 week nivolumab 01/22/2015.   Disposition: Ms. Bores appears stable. She has completed 1 cycle of nivolumab. Plan to proceed with cycle 2 today as scheduled.  The nausea predated the start of nivolumab. Compazine is not effective. We will send a prescription to her pharmacy for Zofran.  For sleep we recommended she continue Tylenol PM.  She will return for a follow-up visit and cycle 3 nivolumab in 2 weeks. She will contact the office in the interim with any problems.  Plan reviewed with Dr. Julien Nordmann.    Ned Card ANP/GNP-BC   02/05/2015  12:28 PM

## 2015-02-05 NOTE — Progress Notes (Signed)
At end of Nivolumab infusion, patient c/o nausea, small amount emesis. Order received for Zofran 8 mg Iv. Patient also instructed to pick up rx for Zofran at her pharmacy.

## 2015-02-19 ENCOUNTER — Encounter: Payer: Self-pay | Admitting: Physician Assistant

## 2015-02-19 ENCOUNTER — Telehealth: Payer: Self-pay | Admitting: Internal Medicine

## 2015-02-19 ENCOUNTER — Other Ambulatory Visit (HOSPITAL_BASED_OUTPATIENT_CLINIC_OR_DEPARTMENT_OTHER): Payer: Medicare Other

## 2015-02-19 ENCOUNTER — Ambulatory Visit: Payer: Medicare Other

## 2015-02-19 ENCOUNTER — Ambulatory Visit (HOSPITAL_BASED_OUTPATIENT_CLINIC_OR_DEPARTMENT_OTHER): Payer: Medicare Other | Admitting: Physician Assistant

## 2015-02-19 ENCOUNTER — Other Ambulatory Visit: Payer: Medicare Other

## 2015-02-19 ENCOUNTER — Other Ambulatory Visit: Payer: Self-pay | Admitting: Physician Assistant

## 2015-02-19 ENCOUNTER — Ambulatory Visit (HOSPITAL_BASED_OUTPATIENT_CLINIC_OR_DEPARTMENT_OTHER): Payer: Medicare Other

## 2015-02-19 VITALS — BP 115/65 | HR 111 | Temp 98.2°F | Resp 18 | Ht 63.0 in | Wt 125.3 lb

## 2015-02-19 VITALS — Resp 18

## 2015-02-19 DIAGNOSIS — C3431 Malignant neoplasm of lower lobe, right bronchus or lung: Secondary | ICD-10-CM | POA: Diagnosis not present

## 2015-02-19 DIAGNOSIS — C3430 Malignant neoplasm of lower lobe, unspecified bronchus or lung: Secondary | ICD-10-CM

## 2015-02-19 DIAGNOSIS — Z5112 Encounter for antineoplastic immunotherapy: Secondary | ICD-10-CM | POA: Diagnosis not present

## 2015-02-19 DIAGNOSIS — J449 Chronic obstructive pulmonary disease, unspecified: Secondary | ICD-10-CM

## 2015-02-19 DIAGNOSIS — R110 Nausea: Secondary | ICD-10-CM

## 2015-02-19 DIAGNOSIS — Z79899 Other long term (current) drug therapy: Secondary | ICD-10-CM

## 2015-02-19 DIAGNOSIS — R530 Neoplastic (malignant) related fatigue: Secondary | ICD-10-CM

## 2015-02-19 DIAGNOSIS — E039 Hypothyroidism, unspecified: Secondary | ICD-10-CM

## 2015-02-19 LAB — CBC WITH DIFFERENTIAL/PLATELET
BASO%: 1.3 % (ref 0.0–2.0)
Basophils Absolute: 0.1 10*3/uL (ref 0.0–0.1)
EOS%: 2 % (ref 0.0–7.0)
Eosinophils Absolute: 0.1 10*3/uL (ref 0.0–0.5)
HCT: 39.9 % (ref 34.8–46.6)
HEMOGLOBIN: 13.1 g/dL (ref 11.6–15.9)
LYMPH#: 1.4 10*3/uL (ref 0.9–3.3)
LYMPH%: 27.2 % (ref 14.0–49.7)
MCH: 31.3 pg (ref 25.1–34.0)
MCHC: 32.9 g/dL (ref 31.5–36.0)
MCV: 95.2 fL (ref 79.5–101.0)
MONO#: 0.4 10*3/uL (ref 0.1–0.9)
MONO%: 7.5 % (ref 0.0–14.0)
NEUT%: 62 % (ref 38.4–76.8)
NEUTROS ABS: 3.2 10*3/uL (ref 1.5–6.5)
Platelets: 413 10*3/uL — ABNORMAL HIGH (ref 145–400)
RBC: 4.19 10*6/uL (ref 3.70–5.45)
RDW: 14.7 % — AB (ref 11.2–14.5)
WBC: 5.2 10*3/uL (ref 3.9–10.3)

## 2015-02-19 LAB — COMPREHENSIVE METABOLIC PANEL (CC13)
ALBUMIN: 3.6 g/dL (ref 3.5–5.0)
ALK PHOS: 69 U/L (ref 40–150)
ALT: 6 U/L (ref 0–55)
ANION GAP: 11 meq/L (ref 3–11)
AST: 11 U/L (ref 5–34)
BILIRUBIN TOTAL: 0.33 mg/dL (ref 0.20–1.20)
BUN: 12 mg/dL (ref 7.0–26.0)
CHLORIDE: 107 meq/L (ref 98–109)
CO2: 24 mEq/L (ref 22–29)
CREATININE: 0.7 mg/dL (ref 0.6–1.1)
Calcium: 9.6 mg/dL (ref 8.4–10.4)
EGFR: 88 mL/min/{1.73_m2} — ABNORMAL LOW (ref >90)
Glucose: 96 mg/dl (ref 70–140)
Potassium: 4.1 mEq/L (ref 3.5–5.1)
Sodium: 141 mEq/L (ref 136–145)
TOTAL PROTEIN: 6.7 g/dL (ref 6.4–8.3)

## 2015-02-19 LAB — TSH CHCC: TSH: 2.037 m[IU]/L (ref 0.308–3.960)

## 2015-02-19 MED ORDER — SODIUM CHLORIDE 0.9 % IV SOLN
3.0000 mg/kg | Freq: Once | INTRAVENOUS | Status: AC
  Administered 2015-02-19: 180 mg via INTRAVENOUS
  Filled 2015-02-19: qty 18

## 2015-02-19 MED ORDER — SODIUM CHLORIDE 0.9 % IV SOLN
Freq: Once | INTRAVENOUS | Status: AC
  Administered 2015-02-19: 16:00:00 via INTRAVENOUS

## 2015-02-19 MED ORDER — SODIUM CHLORIDE 0.9 % IV SOLN
Freq: Once | INTRAVENOUS | Status: AC
  Administered 2015-02-19: 16:00:00 via INTRAVENOUS
  Filled 2015-02-19: qty 4

## 2015-02-19 MED ORDER — LORAZEPAM 2 MG/ML IJ SOLN
0.5000 mg | Freq: Once | INTRAMUSCULAR | Status: AC
Start: 2015-02-19 — End: 2015-02-19
  Administered 2015-02-19: 0.5 mg via INTRAVENOUS

## 2015-02-19 MED ORDER — ONDANSETRON HCL 8 MG PO TABS: 8.0000 mg | ORAL_TABLET | Freq: Three times a day (TID) | ORAL | Status: DC | PRN

## 2015-02-19 MED ORDER — HEPARIN SOD (PORK) LOCK FLUSH 100 UNIT/ML IV SOLN
500.0000 [IU] | Freq: Once | INTRAVENOUS | Status: AC | PRN
  Administered 2015-02-19: 500 [IU]
  Filled 2015-02-19: qty 5

## 2015-02-19 MED ORDER — ONDANSETRON HCL 8 MG PO TABS: 8.0000 mg | ORAL_TABLET | Freq: Two times a day (BID) | ORAL | Status: DC | PRN

## 2015-02-19 MED ORDER — SODIUM CHLORIDE 0.9 % IJ SOLN
10.0000 mL | INTRAMUSCULAR | Status: DC | PRN
  Administered 2015-02-19: 10 mL
  Filled 2015-02-19: qty 10

## 2015-02-19 MED ORDER — LORAZEPAM 2 MG/ML IJ SOLN
INTRAMUSCULAR | Status: AC
Start: 2015-02-19 — End: 2015-02-19
  Filled 2015-02-19: qty 1

## 2015-02-19 NOTE — Progress Notes (Signed)
Pt in for Nivolumab infusion. Nauseated with dry heaves on arrival to infusion room. Zofran administered as ordered. Pt reports leg and body aches 10/10 on pain scale. Awilda Metro, PA notified. Non pharmacological interventions only. Pt to continue home regimen for pain.  1650: Pt reports nausea improved. Resting with eyes closed. 1728: Pt began vomiting small amounts of clear liquid. Dr. Julien Nordmann notified. Order received for Ativan IV. Nivolumab infusion paused. Normal saline started, wide open. Ativan given IV push. 1745: Pt reports nausea has resolved.

## 2015-02-19 NOTE — Patient Instructions (Signed)
[facility] Discharge Instructions for Patients Receiving Chemotherapy  Today you received the following biotherapy agents: Nivolumab.  To help prevent nausea and vomiting after your treatment, we encourage you to take your nausea medication: Compazine. Take one every 6 hours as needed.   If you develop nausea and vomiting that is not controlled by your nausea medication, call the clinic.   BELOW ARE SYMPTOMS THAT SHOULD BE REPORTED IMMEDIATELY:  *FEVER GREATER THAN 100.5 F  *CHILLS WITH OR WITHOUT FEVER  NAUSEA AND VOMITING THAT IS NOT CONTROLLED WITH YOUR NAUSEA MEDICATION  *UNUSUAL SHORTNESS OF BREATH  *UNUSUAL BRUISING OR BLEEDING  TENDERNESS IN MOUTH AND THROAT WITH OR WITHOUT PRESENCE OF ULCERS  *URINARY PROBLEMS  *BOWEL PROBLEMS  UNUSUAL RASH Items with * indicate a potential emergency and should be followed up as soon as possible.  Feel free to call the clinic should you have any questions or concerns. The clinic phone number is [phone].  Please show the [facility] at check-in to the Emergency Department and triage nurse.

## 2015-02-19 NOTE — Progress Notes (Addendum)
[place] [phone]   [phone]  OFFICE PROGRESS NOTE  Shirline Frees, MD [place] [address]  PRINCIPAL DIAGNOSIS: Metastatic non-small cell lung cancer initially diagnosed as Unresectable stage IIB (T3 N0 M0) non-small cell lung cancer, adenocarcinoma diagnosed in June 2011.   PRIOR THERAPY:  1. Status post concurrent chemoradiation with weekly carboplatin and paclitaxel. Last dose was given February 24, 2010. 2. Consolidation chemotherapy with carboplatin and Alimta, status post 3 cycles. Last dose was given June 09, 2010. 3. Curative radiotherapy to a right lower lobe nodule under the care of Dr. Pablo Ledger completed 04/20/2012. 4. Systemic chemotherapy with carboplatin for AUC of 5 and Alimta 500 mg/M2 every 3 weeks. First dose 01/24/2014. Status post 6 cycles with partial response.  CURRENT THERAPY: Immunotherapy with Nivolumab 3 MG/KG every 2 weeks. First cycle expected on 01/22/19 Thus far Maria Lucero is tolerating 16. Status post 2 cycles  INTERVAL HISTORY: Maria Lucero 64 y.o. female returns to the clinic today for followup visit accompanied her husband. Maria Lucero completed 6 cycles of systemic chemotherapy with carboplatin and Alimta with partial response. Maria Lucero is currently being treated with immunotherapy with Nivolumab.Thus far Maria Lucero is tolerating this treatment relatively well. Maria Lucero denied any diarrhea, skin rashes or changes in her baseline breathing. Maria Lucero does report history of migraine headaches as well as reporting some leg pain. Due to leg pain Maria Lucero has increased her pain pills from 1 tablet every 6 hours to 2 tablets every 6 hours.  Maria Lucero denied having any significant chest pain, cough or hemoptysis. Maria Lucero has no fever or chills, no nausea or vomiting.   MEDICAL HISTORY: Past Medical History  Diagnosis Date  . Migraine headache   . Anxiety   . Urticaria   . Low back pain syndrome   . Hyperlipidemia   .  Hypercholesterolemia   . Depression   . Tobacco abuse   . Myalgia and myositis, unspecified   . Bipolar 1 disorder   . Allergy   . Asthma     per pt stated  . Blood transfusion     several x  . Cataract     recent surgery b/l  . Arthritis   . History of radiation therapy 04/11/12; 04/13/12; 04/15/12; 04/18/12; & 04/20/12    Rll lung 60Gy/5/fx  . GERD (gastroesophageal reflux disease)   . COPD (chronic obstructive pulmonary disease)     home o2 at 2l/min  . Shortness of breath     home o2 at 2l/min  . Complication of anesthesia     low oxygen  . PONV (postoperative nausea and vomiting)   . Fracture of humerus, proximal, left, closed 06/16/2013  . lung ca dx'd 11/2009    chemo comp 05/2010,home o2 at 2l/min  . Lung cancer     invasive left upper lobe adenocarcinoma    ALLERGIES:  is allergic to methocarbamol; morphine sulfate er beads; aspirin; avinza; clarithromycin; codeine; lyrica; oxycontin; phenergan; and levaquin.  MEDICATIONS:  Current Outpatient Prescriptions  Medication Sig Dispense Refill  . albuterol (PROVENTIL) (2.5 MG/3ML) 0.083% nebulizer solution Take 2.5 mg by nebulization every 6 (six) hours as needed for wheezing.    . clonazePAM (KLONOPIN) 1 MG tablet Take 1 mg by mouth at bedtime.     . clorazepate (TRANXENE) 3.75 MG tablet Take 3.75 mg by mouth 3 (three) times daily as needed for anxiety.    . CRESTOR 5 MG tablet Take 5 mg by  mouth daily.    Marland Kitchen dexamethasone (DECADRON) 4 MG tablet 4 Milligram by mouth twice a day the day before, day of and day after the chemotherapy every three-week. 40 tablet 1  . divalproex (DEPAKOTE) 250 MG DR tablet Take 750 mg by mouth at bedtime.    . folic acid (FOLVITE) 1 MG tablet Take 1 tablet (1 mg total) by mouth daily. 30 tablet 4  . hydroxychloroquine (PLAQUENIL) 200 MG tablet Take 200 mg by mouth 2 (two) times daily.     Marland Kitchen levothyroxine (SYNTHROID, LEVOTHROID) 25 MCG tablet Take 12.5 mcg by mouth daily before breakfast.    .  ondansetron (ZOFRAN) 8 MG tablet Take 1 tablet (8 mg total) by mouth every 8 (eight) hours as needed for nausea or vomiting. 30 tablet 1  . oxyCODONE-acetaminophen (PERCOCET) 10-325 MG per tablet Take 1-2 tablets by mouth every 6 (six) hours as needed for pain.    Marland Kitchen prochlorperazine (COMPAZINE) 10 MG tablet Take 1 tablet (10 mg total) by mouth every 6 (six) hours as needed for nausea or vomiting. 30 tablet 0  . Sennosides-Docusate Sodium (STOOL SOFTENER & LAXATIVE PO) Take 1 tablet by mouth daily.    Marland Kitchen triamcinolone cream (KENALOG) 0.1 % Apply 1 application topically 2 (two) times daily as needed (for itching).     . [DISCONTINUED] lisinopril-hydrochlorothiazide (PRINZIDE,ZESTORETIC) 10-12.5 MG per tablet Take 1 tablet by mouth.      Current Facility-Administered Medications  Medication Dose Route Frequency Provider Last Rate Last Dose  . ondansetron (ZOFRAN) 8 mg in sodium chloride 0.9 % 50 mL IVPB   Intravenous Once Carlton Adam, PA-C        SURGICAL HISTORY:  Past Surgical History  Procedure Laterality Date  . Back surgery      x 4 L3-S1 1996,2000, 2002  . Vesicovaginal fistula closure w/ tah    . Appendectomy    . Bronchoscopy  2011  . Lung biopsy  2011    needle  . Tonsillectomy    . Abdominal hysterectomy      with b/l oophoorectomy age 59  . Lumbar fusion    . Orif humerus fracture Left 06/16/2013    Procedure: OPEN REDUCTION INTERNAL FIXATION (ORIF) LEFT SHOULDER;  Surgeon: Johnny Bridge, MD;  Location: [facility];  Service: Orthopedics;  Laterality: Left;    REVIEW OF SYSTEMS:  Constitutional: positive for anorexia and fatigue Eyes: negative Ears, nose, mouth, throat, and face: negative Respiratory: positive for cough and dyspnea on exertion Cardiovascular: negative Gastrointestinal: negative Genitourinary:negative Integument/breast: negative Hematologic/lymphatic: negative Musculoskeletal:negative Neurological: negative Behavioral/Psych: negative Endocrine:  negative Allergic/Immunologic: negative   PHYSICAL EXAMINATION: General appearance: alert, cooperative, fatigued and no distress Head: Normocephalic, without obvious abnormality, atraumatic Neck: no adenopathy Lymph nodes: Cervical, supraclavicular, and axillary nodes normal. Resp: clear to auscultation bilaterally Back: symmetric, no curvature. ROM normal. No CVA tenderness. Cardio: regular rate and rhythm, S1, S2 normal, no murmur, click, rub or gallop GI: soft, non-tender; bowel sounds normal; no masses,  no organomegaly Extremities: extremities normal, atraumatic, no cyanosis or edema Neurologic: Alert and oriented X 3, normal strength and tone. Normal symmetric reflexes. Normal coordination and gait  ECOG PERFORMANCE STATUS: 2 - Symptomatic, <50% confined to bed  Blood pressure 115/65, pulse 111, temperature 98.2 F (36.8 C), temperature source Oral, resp. rate 18, height '5\' 3"'$  (1.6 m), weight 125 lb 4.8 oz (56.836 kg), SpO2 99 %.  LABORATORY DATA: Lab Results  Component Value Date   WBC 5.2 02/19/2015  HGB 13.1 02/19/2015   HCT 39.9 02/19/2015   MCV 95.2 02/19/2015   PLT 413* 02/19/2015      Chemistry      Component Value Date/Time   NA 141 02/19/2015 1451   NA 145 06/28/2013 0443   NA 145 08/24/2011 1430   K 4.1 02/19/2015 1451   K 3.8 06/28/2013 0443   K 4.3 08/24/2011 1430   CL 101 06/28/2013 0443   CL 101 06/20/2012 1119   CL 95* 08/24/2011 1430   CO2 24 02/19/2015 1451   CO2 38* 06/28/2013 0443   CO2 31 08/24/2011 1430   BUN 12.0 02/19/2015 1451   BUN 18 06/28/2013 0443   BUN 15 08/24/2011 1430   CREATININE 0.7 02/19/2015 1451   CREATININE 0.59 06/28/2013 0443   CREATININE 0.8 08/24/2011 1430      Component Value Date/Time   CALCIUM 9.6 02/19/2015 1451   CALCIUM 7.8* 06/28/2013 0443   CALCIUM 8.6 08/24/2011 1430   ALKPHOS 69 02/19/2015 1451   ALKPHOS 71 08/24/2011 1430   ALKPHOS 98 06/28/2010 0600   AST 11 02/19/2015 1451   AST 15 08/24/2011 1430    AST 14 06/28/2010 0600   ALT 6 02/19/2015 1451   ALT 7* 08/24/2011 1430   ALT 10 06/28/2010 0600   BILITOT 0.33 02/19/2015 1451   BILITOT 0.50 08/24/2011 1430   BILITOT 0.4 06/28/2010 0600       RADIOGRAPHIC STUDIES: Ct Chest W Contrast  12/11/2014   CLINICAL DATA:  Lung cancer diagnosed in 2011 with metastasis to both lungs in 2015. Radiation therapy complete in 2013. chemotherapy in progress. Chest pain. Cough. Shortness of breath. COPD. Right lower lobe primary. Restaging.  EXAM: CT CHEST WITH CONTRAST  TECHNIQUE: Multidetector CT imaging of the chest was performed during intravenous contrast administration.  CONTRAST:  48m OMNIPAQUE IOHEXOL 300 MG/ML  SOLN  COMPARISON:  09/07/2014  FINDINGS: Mediastinum/Nodes: Similar appearance of small right jugular nodes, not pathologic by size criteria. Right Port-A-Cath which terminates at the high right atrium. Normal heart size with trace pericardial fluid, likely physiologic. No central pulmonary embolism, on this non-dedicated study. No mediastinal or hilar adenopathy.  Lungs/Pleura: Left-sided pleural thickening is similar. Advanced centrilobular emphysema.  Right upper lobe pleural-based nodule is either new or more well-defined today. 9 x 5 mm on image 25.  Nodules along the right minor fissure are slightly enlarged. Example 6 mm nodule laterally today at the site of 1-2 mm nodules on the prior.  Superior segment right lower lobe spiculated nodule measures 1.2 x 1.7 cm on image 28 versus 0.9 x 1.3 cm on the prior.  Increased irregular right lower lobe opacity, measuring 2.7 x 4.0 cm on image 35 today versus 2.1 x 2.3 cm on the prior.  Right lower lobe 9 mm nodule is increased from 4 mm on the prior. Image 40 today.  Similar appearance of left apical and upper lung consolidation and bronchiectasis, likely radiation induced. No well-defined residual or recurrent mass in this area.  Upper abdomen: Beam hardening artifact degradation secondary to spine  fixation. Grossly normal imaged portions of the liver, spleen, stomach, pancreas, adrenal glands, kidneys.  Musculoskeletal: Plate and screw fixation of the proximal left humerus. Left thoracotomy versus remote posttraumatic changes about upper left ribs. Lower thoracic spine fixation. At least T10 through L2. Similar appearance of the T10 right-sided screw extending into the intervertebral disc. Mild accentuation of expected thoracic kyphosis with presumed degenerative sclerosis about the T8-9 level.  IMPRESSION: 1.  Significant enlargement of multiple right-sided pulmonary nodules, consistent with disease progression. 2. Similar appearance of presumed radiation change in the left apex and upper lung. 3. No thoracic adenopathy.   Electronically Signed   By: Abigail Miyamoto M.D.   On: 12/11/2014 16:07   ASSESSMENT AND PLAN: This is a very pleasant 64 years old white female with metastatic non-small cell lung cancer initially diagnosed as unresectable stage IIB non-small cell lung cancer status post concurrent chemoradiation followed by consolidation chemotherapy. The patient had evidence for disease progression and Maria Lucero was started on systemic chemotherapy with carboplatin and Alimta status post 6 cycles. Maria Lucero had been observation for around 10 months but unfortunately the recent CT scan of the chest showed evidence for disease progression. Maria Lucero is currently receiving second line treatment with immunotherapy with Nivolumab 3 MG/KG every 2 weeks. Status post 2 cycles. The patient was discussed with and also seen by Dr. Julien Nordmann. Maria Lucero will follow up in 2 weeks prior to cycle #3. The patient was advised to call immediately if Maria Lucero has any concerning symptoms in the interval.  All questions were answered. The patient knows to call the clinic with any problems, questions or concerns. We can certainly see the patient much sooner if necessary. The patient and her family agreed to the current plan.  Carlton Adam,  PA-C 02/19/2015   ADDENDUM: Hematology/Oncology Attending: I had a face to face encounter with the patient. I recommended her care plan. This is a very pleasant 64 years old white female with metastatic non-small cell lung cancer is currently undergoing treatment with immunotherapy with Nivolumab status post 2 cycles. Maria Lucero is tolerating her treatment well but Maria Lucero continues to have the baseline shortness of breath secondary to severe COPD. The patient denied having any significant nausea or vomiting, no diarrhea or skin rash. I recommended for her to proceed with cycle #3 today as scheduled. The patient would come back for follow-up visit in 2 weeks for reevaluation before starting cycle #4. Maria Lucero was advised to call immediately if Maria Lucero has any concerning symptoms in the interval.  Disclaimer: This note was dictated with voice recognition software. Similar sounding words can inadvertently be transcribed and may not be corrected upon review. Eilleen Kempf., MD 02/26/2015

## 2015-02-19 NOTE — Telephone Encounter (Signed)
Called pharmacy in reference to Prescription e scribed by Burnetta Sabin for zofran 8 mg . Relayed message from [place] to cancel that order. Spoke with Sharyn Lull at pharmacy stated she hadn't received it yet but will discard it when it comes in

## 2015-02-19 NOTE — Telephone Encounter (Signed)
Gave adn printed appt sched and avs for pt for Sept °

## 2015-02-22 ENCOUNTER — Other Ambulatory Visit: Payer: Self-pay | Admitting: Physician Assistant

## 2015-02-23 IMAGING — CR DG SHOULDER 2+V*L*
2 series · 2 of 2 positions shown · non-contrast
Comparison: None.

CLINICAL DATA: Fall. Left shoulder pain. Obvious fracture
deformity.

EXAM:
LEFT SHOULDER - 2+ VIEW

[x shoulder ap left]
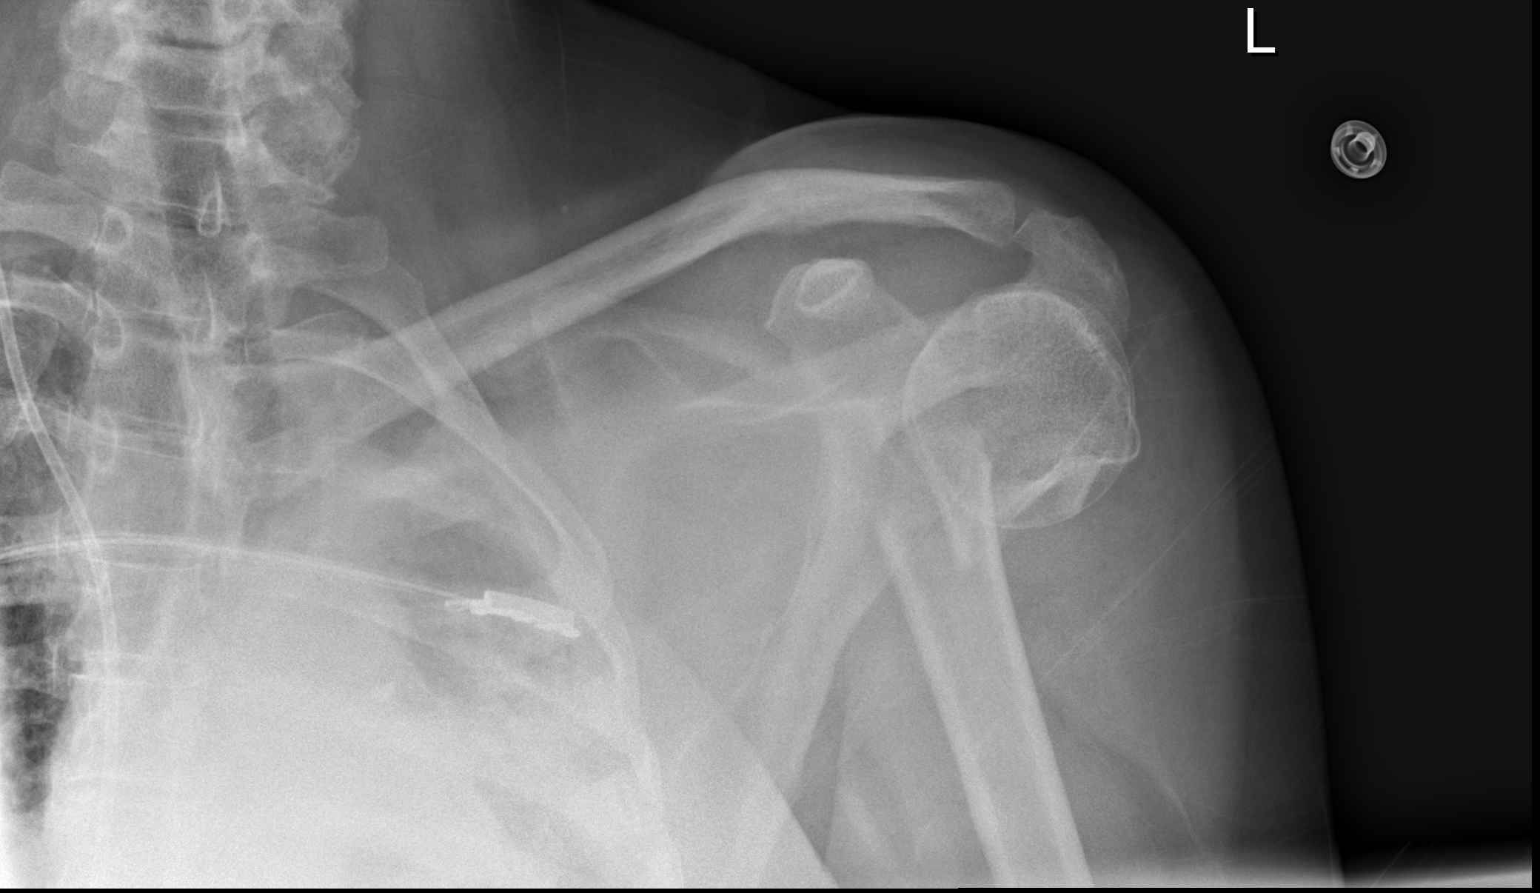

[x scapula y-view left]
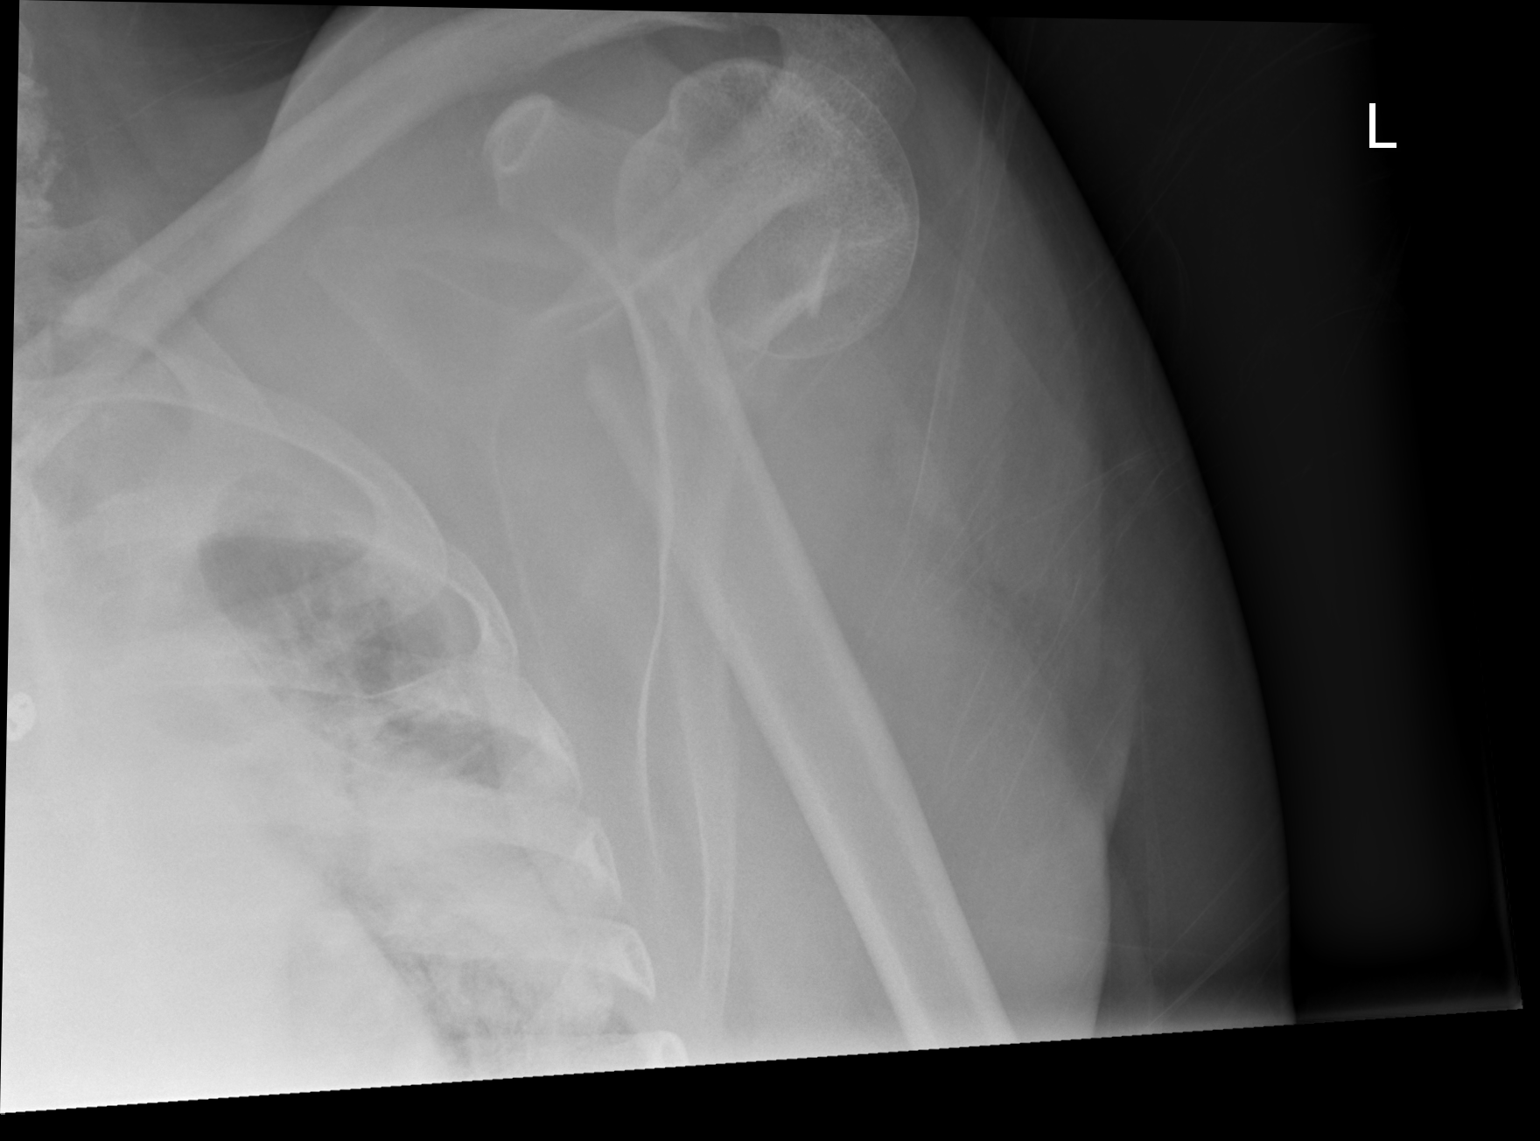

[2 of 2 positions shown; findings below may reference images not displayed]

FINDINGS: Left humeral neck fracture is seen, with medial displacement of the
humeral shaft by greater than 1 shaft's width. No evidence of
dislocation of humeral head.
IMPRESSION: Left femoral neck fracture, with significant medial displacement of
humeral shaft.

## 2015-02-25 NOTE — Patient Instructions (Signed)
Follow up in 2 weeks prior to your next scheduled cycle of immunotherapy 

## 2015-03-03 IMAGING — CR DG CHEST 1V PORT
1 series · 1 of 1 positions shown · non-contrast
Comparison: 06/15/2013.

CLINICAL DATA: Short of breath.

EXAM:
PORTABLE CHEST - 1 VIEW

[AP]
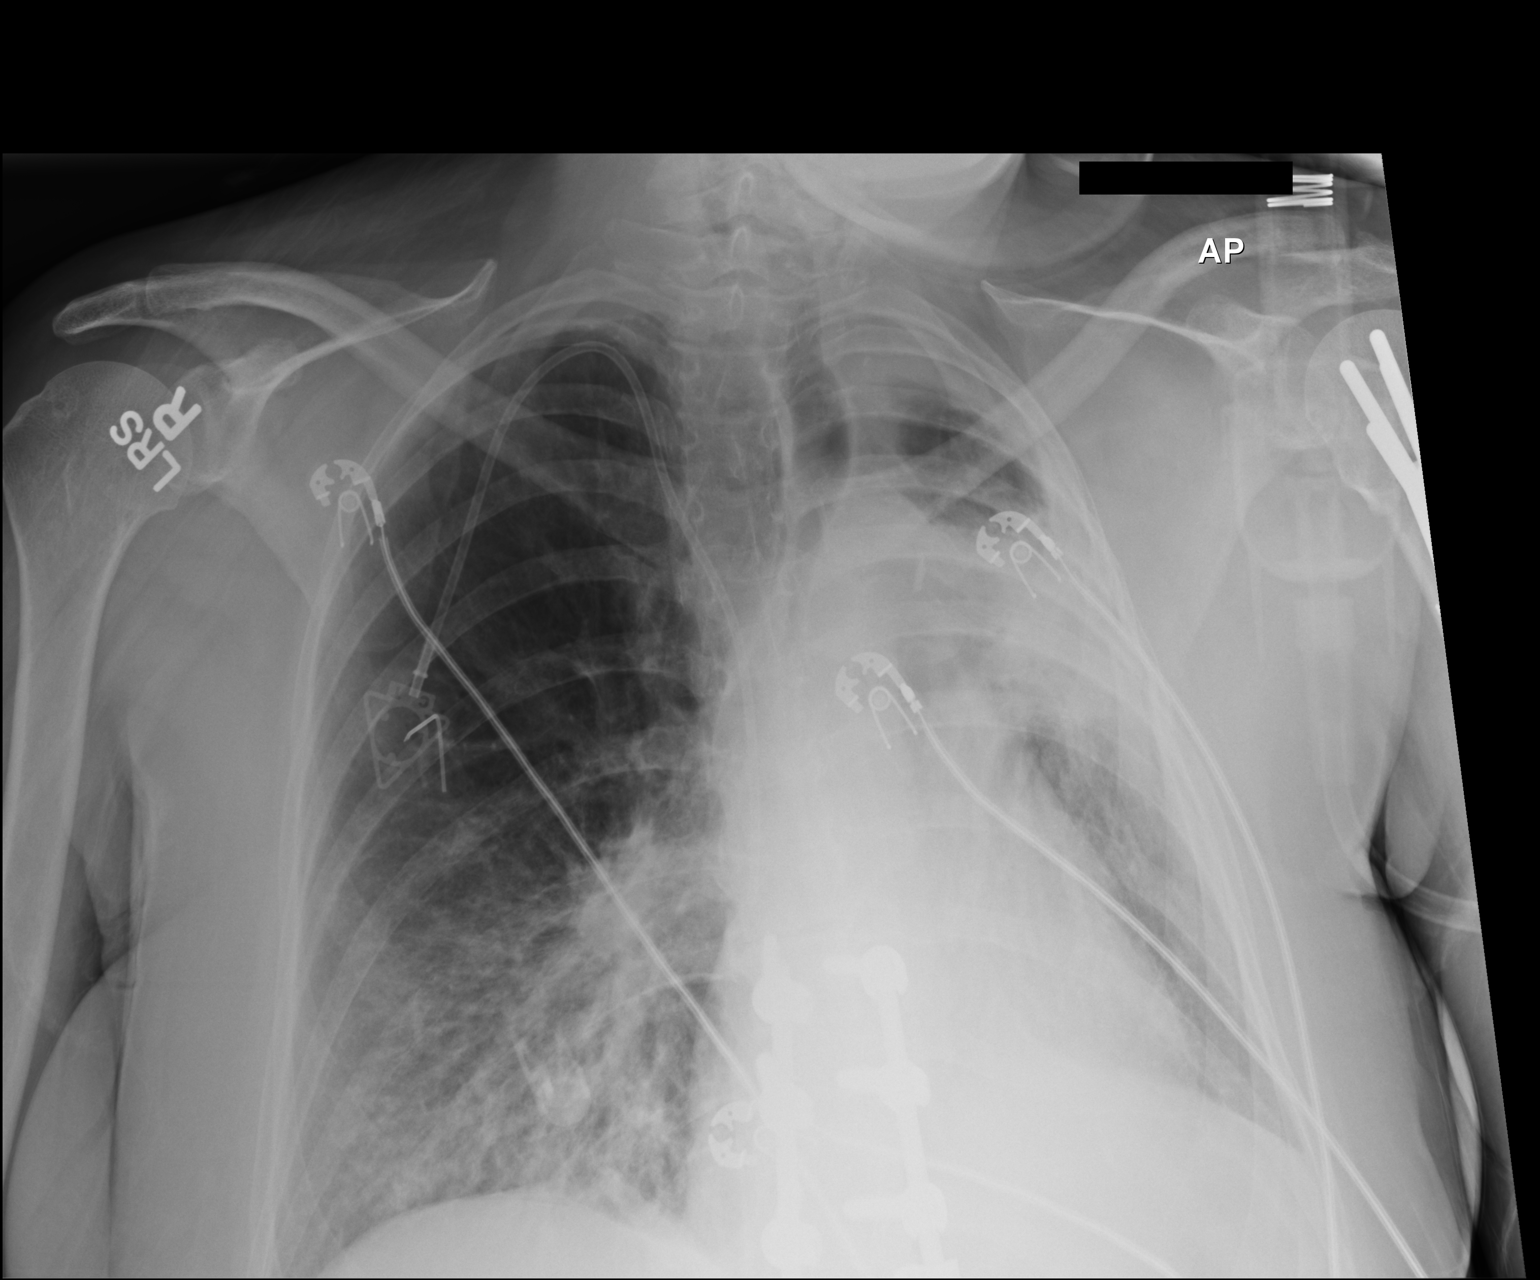

[1 of 1 positions shown; findings below may reference images not displayed]

FINDINGS: Chronic changes of the left hemithorax are present. There is new
airspace disease at the right lung base which may represent
aspiration, asymmetric pulmonary edema or pneumonia.
Cardiopericardial silhouette appears similar. Power port from right
IJ approach again noted, unchanged.
IMPRESSION: New right basilar airspace disease favored to represent aspiration
or pneumonia. Asymmetric/atypical pulmonary edema is in the
differential considerations.

## 2015-03-04 IMAGING — CT CT ANGIO CHEST
2 of 9 series · 18 of 46 positions shown · IV contrast (APPLIED)
Comparison: CT of the chest performed 12/26/2012, and chest
radiograph performed 06/19/2013

CLINICAL DATA: Hypoxemia; hypotensive.

EXAM:
CT ANGIOGRAPHY CHEST WITH CONTRAST
TECHNIQUE: Multidetector CT imaging of the chest was performed using the
standard protocol during bolus administration of intravenous
contrast. Multiplanar CT image reconstructions including MIPs were
obtained to evaluate the vascular anatomy.
CONTRAST:  100mL OMNIPAQUE IOHEXOL 350 MG/ML SOLN

[Series 5: thins · axial · 0.61mm/px · z∈[+911,+1171]mm · 15 of 294 slices shown]
[im 17/294  lung]
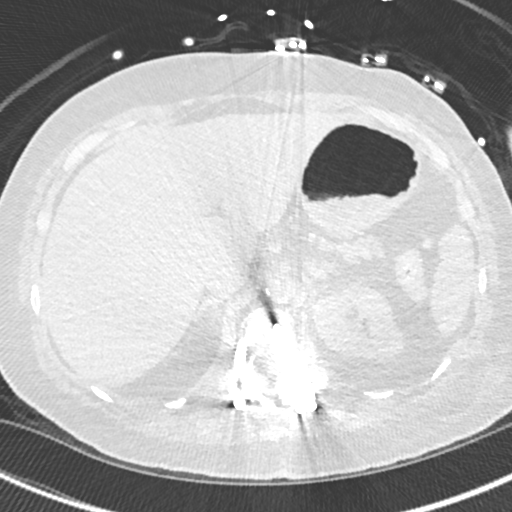
[im 33/294  soft-tissue]
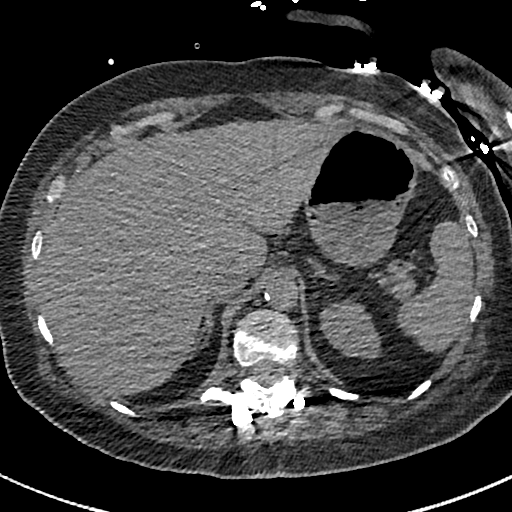
[im 49/294  lung]
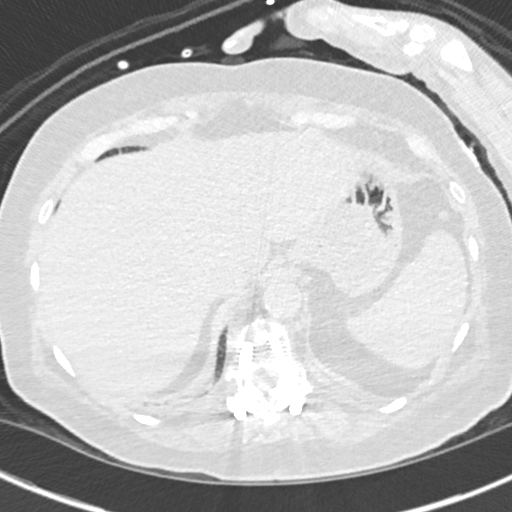
[im 66/294  soft-tissue]
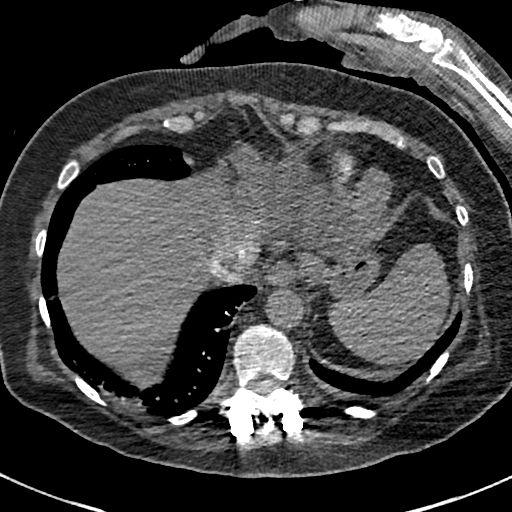
[im 98/294  lung]
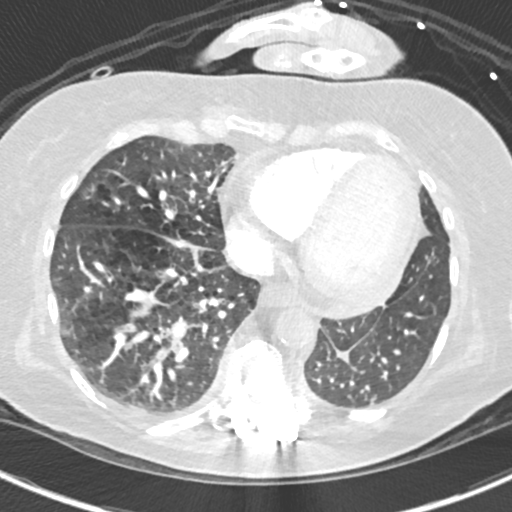
[im 114/294  soft-tissue]
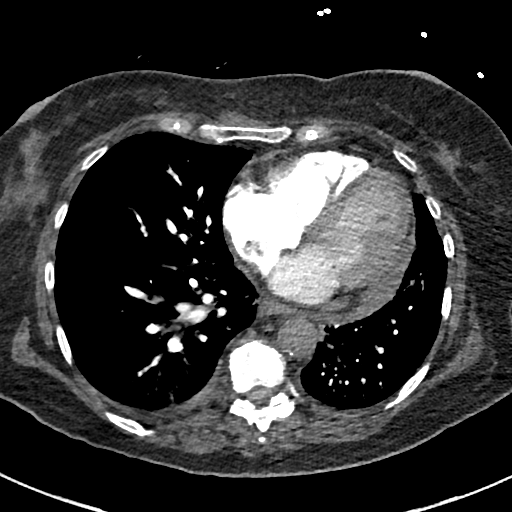
[im 131/294  lung]
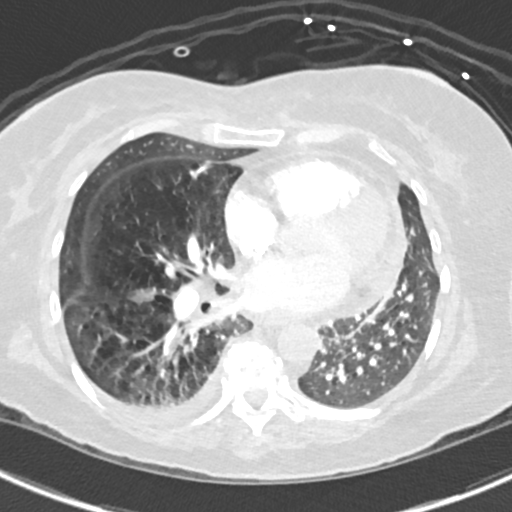
[im 147/294  soft-tissue]
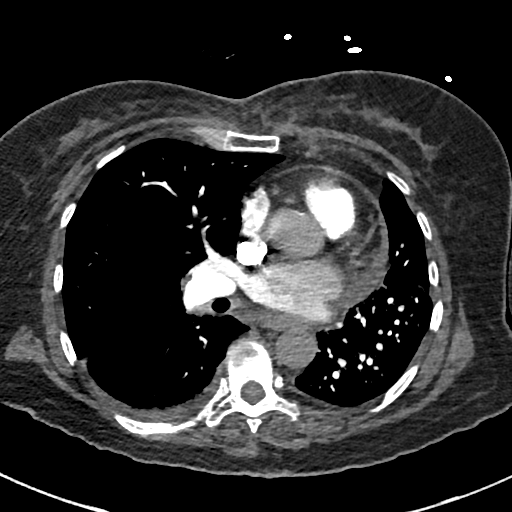
[im 163/294  lung]
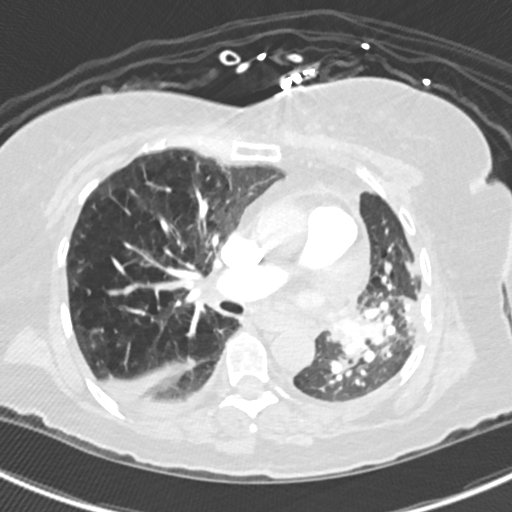
[im 180/294  soft-tissue]
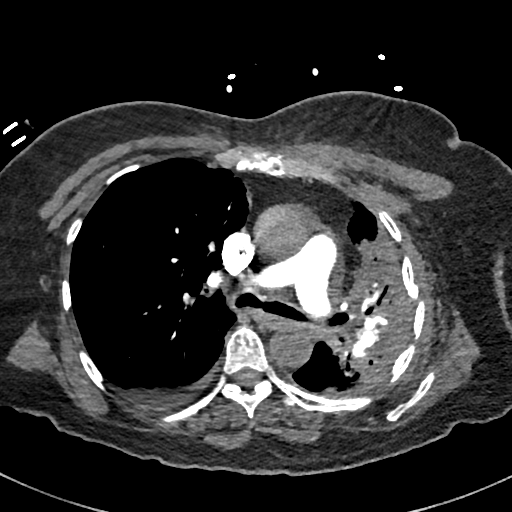
[im 196/294  lung]
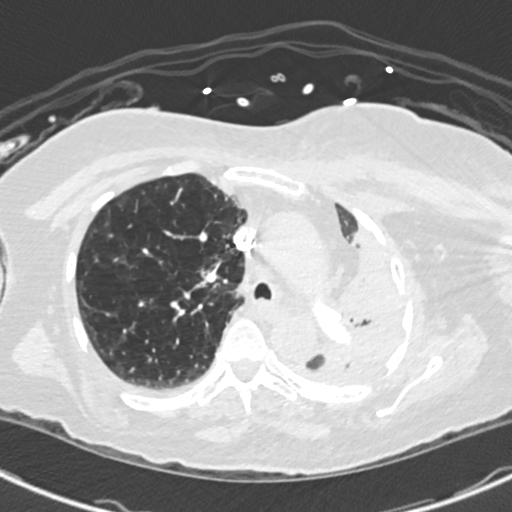
[im 228/294  soft-tissue]
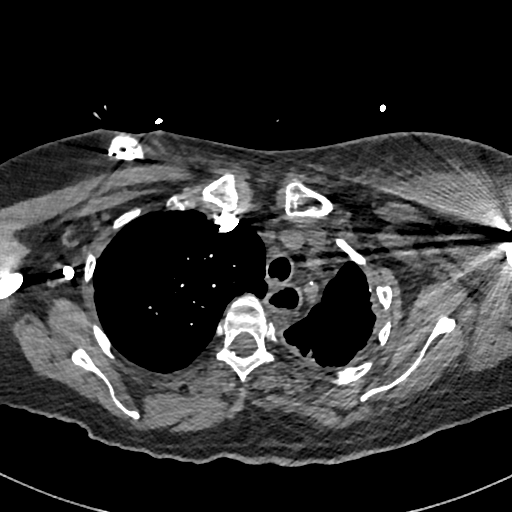
[im 245/294  lung]
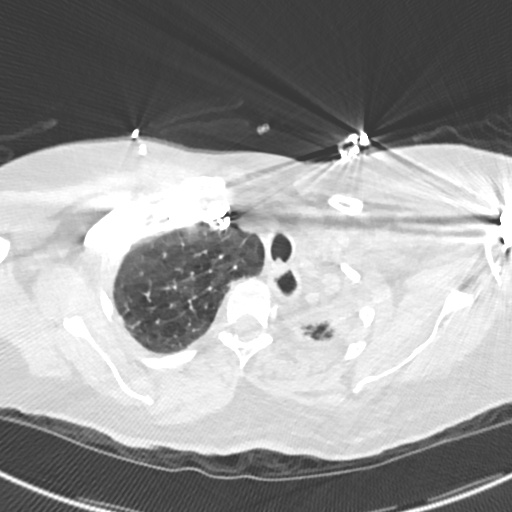
[im 261/294  soft-tissue]
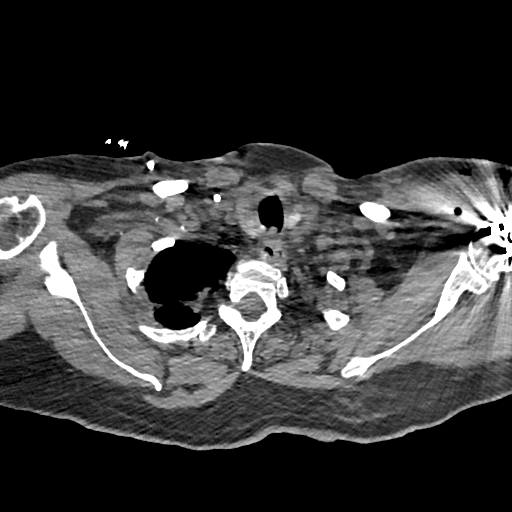
[im 277/294  lung]
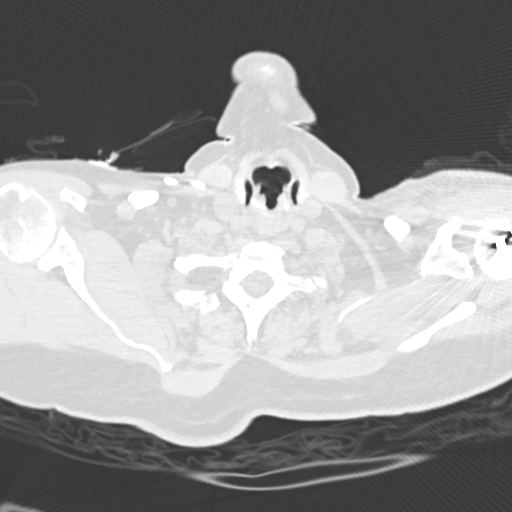

[Series 7: coronal mpr · coronal · 0.59mm/px · 3 of 151 slices shown]
[im 38/151  soft-tissue]
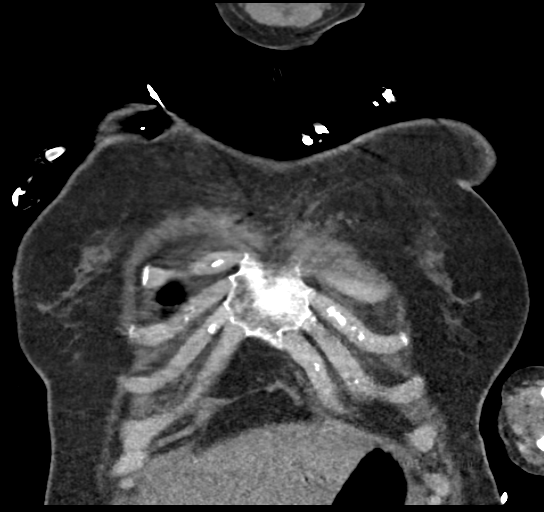
[im 76/151  soft-tissue]
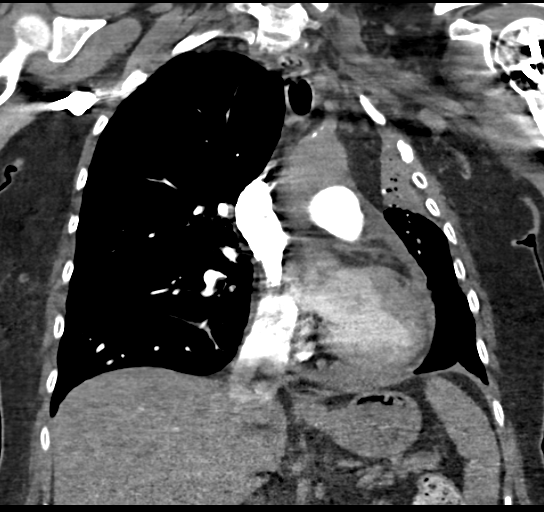
[im 113/151  soft-tissue]
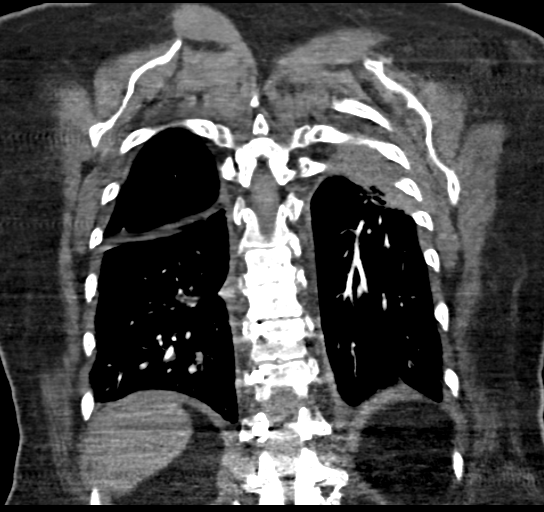

[18 of 46 positions shown; findings below may reference images not displayed]

FINDINGS: There is no evidence of pulmonary embolus. Evaluation for pulmonary
embolus is suboptimal in areas of airspace opacification.

A prominent spiculated hazy opacity at the right lower lobe is
slightly less well defined than on the prior study, with somewhat
increased architectural distortion. As before, this raises concern
for bronchogenic carcinoma. There is now a trace right pleural
effusion. The previously noted nodule along the right major fissure
now measures 0.8 cm, increased in size from the prior study, and a
new 0.9 cm nodule is seen more inferiorly along the right major
fissure. Dense consolidation and volume loss at the left upper lobe
is grossly unchanged in appearance. Underlying emphysematous change
is again noted bilaterally. There is no evidence of pneumothorax.

A small pericardial effusion is noted, similar in appearance to the
prior study. The mediastinum is otherwise unremarkable in
appearance. No mediastinal lymphadenopathy is seen. The great
vessels are grossly unremarkable in appearance. No axillary
lymphadenopathy is seen. The thyroid gland is unremarkable in
appearance.

The visualized portions of the liver and spleen are unremarkable.

No acute osseous abnormalities are seen. Postoperative change is
noted along the proximal left humerus. A right-sided chest port is
noted.

Review of the MIP images confirms the above findings.
IMPRESSION: 1. No evidence for pulmonary embolus.
2. Prominent spiculated hazy opacity at the right lower lung lobe is
slightly less well defined, with somewhat increased architectural
distortion. As before, this is suspicious for bronchogenic
carcinoma. There is now a trace right pleural effusion. Nodules
along the right major fissure have also increased in size, or are
new from the prior study, raising concern for satellite nodules.
3. Stable chronic dense consolidation involving most of the left
upper lung lobe.
4. Bilateral emphysematous change again seen.
5. Stable small pericardial effusion noted.

## 2015-03-05 ENCOUNTER — Ambulatory Visit: Payer: Medicare Other | Admitting: Physician Assistant

## 2015-03-05 ENCOUNTER — Ambulatory Visit: Payer: Medicare Other

## 2015-03-05 ENCOUNTER — Other Ambulatory Visit: Payer: Medicare Other

## 2015-03-05 ENCOUNTER — Telehealth: Payer: Self-pay | Admitting: Internal Medicine

## 2015-03-05 NOTE — Telephone Encounter (Signed)
pt called to cx appt did not want to r/s...advised nurse and chemo

## 2015-03-18 ENCOUNTER — Ambulatory Visit: Payer: Medicare Other | Admitting: Internal Medicine

## 2015-03-19 ENCOUNTER — Telehealth: Payer: Self-pay | Admitting: Internal Medicine

## 2015-03-19 ENCOUNTER — Other Ambulatory Visit: Payer: Medicare Other

## 2015-03-19 ENCOUNTER — Ambulatory Visit (HOSPITAL_BASED_OUTPATIENT_CLINIC_OR_DEPARTMENT_OTHER): Payer: Medicare Other | Admitting: Nurse Practitioner

## 2015-03-19 ENCOUNTER — Other Ambulatory Visit (HOSPITAL_BASED_OUTPATIENT_CLINIC_OR_DEPARTMENT_OTHER): Payer: Medicare Other

## 2015-03-19 ENCOUNTER — Ambulatory Visit: Payer: Medicare Other

## 2015-03-19 ENCOUNTER — Encounter: Payer: Medicare Other | Admitting: Nutrition

## 2015-03-19 VITALS — BP 123/53 | HR 107 | Temp 98.4°F | Resp 18 | Ht 63.0 in | Wt 125.0 lb

## 2015-03-19 DIAGNOSIS — C3431 Malignant neoplasm of lower lobe, right bronchus or lung: Secondary | ICD-10-CM

## 2015-03-19 DIAGNOSIS — C3430 Malignant neoplasm of lower lobe, unspecified bronchus or lung: Secondary | ICD-10-CM

## 2015-03-19 DIAGNOSIS — R530 Neoplastic (malignant) related fatigue: Secondary | ICD-10-CM

## 2015-03-19 DIAGNOSIS — Z79899 Other long term (current) drug therapy: Secondary | ICD-10-CM

## 2015-03-19 DIAGNOSIS — E039 Hypothyroidism, unspecified: Secondary | ICD-10-CM

## 2015-03-19 LAB — CBC WITH DIFFERENTIAL/PLATELET
BASO%: 1.3 % (ref 0.0–2.0)
Basophils Absolute: 0.1 10*3/uL (ref 0.0–0.1)
EOS ABS: 0.1 10*3/uL (ref 0.0–0.5)
EOS%: 1.6 % (ref 0.0–7.0)
HEMATOCRIT: 36.4 % (ref 34.8–46.6)
HEMOGLOBIN: 11.9 g/dL (ref 11.6–15.9)
LYMPH#: 1.1 10*3/uL (ref 0.9–3.3)
LYMPH%: 16.5 % (ref 14.0–49.7)
MCH: 31.3 pg (ref 25.1–34.0)
MCHC: 32.5 g/dL (ref 31.5–36.0)
MCV: 96.2 fL (ref 79.5–101.0)
MONO#: 0.7 10*3/uL (ref 0.1–0.9)
MONO%: 10.2 % (ref 0.0–14.0)
NEUT%: 70.4 % (ref 38.4–76.8)
NEUTROS ABS: 4.6 10*3/uL (ref 1.5–6.5)
Platelets: 385 10*3/uL (ref 145–400)
RBC: 3.79 10*6/uL (ref 3.70–5.45)
RDW: 16.1 % — AB (ref 11.2–14.5)
WBC: 6.6 10*3/uL (ref 3.9–10.3)

## 2015-03-19 LAB — COMPREHENSIVE METABOLIC PANEL (CC13)
ALBUMIN: 2.9 g/dL — AB (ref 3.5–5.0)
ALK PHOS: 59 U/L (ref 40–150)
ALT: <9 U/L (ref 0–55)
AST: 8 U/L (ref 5–34)
Anion Gap: 9 mEq/L (ref 3–11)
BUN: 9.6 mg/dL (ref 7.0–26.0)
CO2: 28 mEq/L (ref 22–29)
Calcium: 9.1 mg/dL (ref 8.4–10.4)
Chloride: 105 mEq/L (ref 98–109)
Creatinine: 0.6 mg/dL (ref 0.6–1.1)
GLUCOSE: 87 mg/dL (ref 70–140)
Potassium: 4 mEq/L (ref 3.5–5.1)
SODIUM: 141 meq/L (ref 136–145)
TOTAL PROTEIN: 5.9 g/dL — AB (ref 6.4–8.3)

## 2015-03-19 MED ORDER — PROCHLORPERAZINE MALEATE 10 MG PO TABS: 10.0000 mg | ORAL_TABLET | Freq: Four times a day (QID) | ORAL | Status: DC | PRN

## 2015-03-19 NOTE — Progress Notes (Signed)
  [facility] OFFICE PROGRESS NOTE   PRINCIPAL DIAGNOSIS: Metastatic non-small cell lung cancer initially diagnosed as Unresectable stage IIB (T3 N0 M0) non-small cell lung cancer, adenocarcinoma diagnosed in June 2011.   PRIOR THERAPY:  1. Status post concurrent chemoradiation with weekly carboplatin and paclitaxel. Last dose was given February 24, 2010. 2. Consolidation chemotherapy with carboplatin and Alimta, status post 3 cycles. Last dose was given June 09, 2010. 3. Curative radiotherapy to a right lower lobe nodule under the care of Dr. Pablo Ledger completed 04/20/2012. 4. Systemic chemotherapy with carboplatin for AUC of 5 and Alimta 500 mg/M2 every 3 weeks. First dose 01/24/2014. Status post 6 cycles with partial response.  CURRENT THERAPY: Immunotherapy with Nivolumab 3 MG/KG every 2 weeks. First cycle expected on 01/22/2015. Status post 3 cycles.   INTERVAL HISTORY:   Ms. Ruotolo returns as scheduled. She completed cycle 3 nivolumab 02/19/2015. She continues to have intermittent nausea/vomiting. This has been ongoing over the past year. She has stable chronic pain. Appetite is poor. She had several loose stools for one day following the most recent treatment. She has recently noted a rash on her neck. She has stable dyspnea. When she is up and moving around she utilizes supplement oxygen at 2 L/m. At rest she does not require supplemental oxygen.  Objective:  Vital signs in last 24 hours:  Blood pressure 123/53, pulse 107, temperature 98.4 F (36.9 C), temperature source Oral, resp. rate 18, height '5\' 3"'$  (1.6 m), weight 125 lb (56.7 kg), SpO2 100 %.    HEENT: No thrush or ulcers. Resp: Lungs clear bilaterally. Cardio: Regular rate and rhythm. GI: Abdomen is soft. Diffuse mild tenderness. No hepatomegaly. Vascular: Trace lower leg edema bilaterally. Neuro: Alert and oriented.  Skin: Faint erythematous rash at the right posterior neck beginning at the hairline.  The rash does not appear vesicular.   Lab Results:  Lab Results  Component Value Date   WBC 5.2 02/19/2015   HGB 13.1 02/19/2015   HCT 39.9 02/19/2015   MCV 95.2 02/19/2015   PLT 413* 02/19/2015   NEUTROABS 3.2 02/19/2015    Imaging:  No results found.  Medications: I have reviewed the patient's current medications.  Assessment/Plan: 1. Metastatic non-small cell lung cancer initially diagnosed as unresectable stage IIB non-small cell lung cancer status post concurrent chemoradiation followed by consolidation chemotherapy. Restaging CT evaluation July 2015 showed increased size of several right lung pulmonary metastases. She completed 6 cycles of carboplatin and Alimta 01/24/2014 through 05/16/2014. Restaging CT evaluation June 2016 showed significant enlargement of multiple right-sided pulmonary nodules consistent with disease progression. Treatment initiated with every 2 week nivolumab 01/22/2015.   Disposition: Ms. Suppa has completed 3 cycles of nivolumab. She declines to proceed with cycle 4 today. She is undecided as to if she wants to continue treatment. She is most comfortable with proceeding with a restaging chest CT next week and returning for a follow-up visit with Dr. Julien Nordmann in 2 weeks to discuss the results.  The plan is for a chest CT for restaging purposes on 03/29/2015 and returning for a follow-up visit on 04/02/2015. She will contact the office in the interim with any problems.    Ned Card ANP/GNP-BC   03/19/2015  3:12 PM

## 2015-03-19 NOTE — Telephone Encounter (Signed)
Gave and printed papt sched and avs for pt for OCT

## 2015-03-20 LAB — TSH CHCC: TSH: 0.266 m(IU)/L — ABNORMAL LOW (ref 0.308–3.960)

## 2015-03-29 ENCOUNTER — Ambulatory Visit (HOSPITAL_COMMUNITY)
Admission: RE | Admit: 2015-03-29 | Discharge: 2015-03-29 | Disposition: A | Discharge status: Home / Self Care | Payer: Medicare Other | Source: Ambulatory Visit | Attending: Nurse Practitioner | Admitting: Nurse Practitioner

## 2015-03-29 DIAGNOSIS — J432 Centrilobular emphysema: Secondary | ICD-10-CM | POA: Insufficient documentation

## 2015-03-29 DIAGNOSIS — Z923 Personal history of irradiation: Secondary | ICD-10-CM | POA: Diagnosis not present

## 2015-03-29 DIAGNOSIS — C3430 Malignant neoplasm of lower lobe, unspecified bronchus or lung: Secondary | ICD-10-CM | POA: Diagnosis not present

## 2015-03-29 DIAGNOSIS — R079 Chest pain, unspecified: Secondary | ICD-10-CM | POA: Diagnosis not present

## 2015-03-29 DIAGNOSIS — Z9221 Personal history of antineoplastic chemotherapy: Secondary | ICD-10-CM | POA: Diagnosis not present

## 2015-03-29 DIAGNOSIS — R918 Other nonspecific abnormal finding of lung field: Secondary | ICD-10-CM | POA: Diagnosis not present

## 2015-03-29 MED ORDER — IOHEXOL 300 MG/ML  SOLN
75.0000 mL | Freq: Once | INTRAMUSCULAR | Status: AC | PRN
  Administered 2015-03-29: 75 mL via INTRAVENOUS

## 2015-04-02 ENCOUNTER — Encounter: Payer: Self-pay | Admitting: Internal Medicine

## 2015-04-02 ENCOUNTER — Ambulatory Visit: Payer: Medicare Other

## 2015-04-02 ENCOUNTER — Other Ambulatory Visit (HOSPITAL_BASED_OUTPATIENT_CLINIC_OR_DEPARTMENT_OTHER): Payer: Medicare Other

## 2015-04-02 ENCOUNTER — Other Ambulatory Visit: Payer: Self-pay | Admitting: Medical Oncology

## 2015-04-02 ENCOUNTER — Ambulatory Visit (HOSPITAL_BASED_OUTPATIENT_CLINIC_OR_DEPARTMENT_OTHER): Payer: Medicare Other | Admitting: Internal Medicine

## 2015-04-02 VITALS — BP 127/66 | HR 103 | Temp 98.3°F | Resp 18 | Ht 63.0 in | Wt 122.0 lb

## 2015-04-02 DIAGNOSIS — C3430 Malignant neoplasm of lower lobe, unspecified bronchus or lung: Secondary | ICD-10-CM

## 2015-04-02 DIAGNOSIS — R530 Neoplastic (malignant) related fatigue: Secondary | ICD-10-CM

## 2015-04-02 DIAGNOSIS — C3431 Malignant neoplasm of lower lobe, right bronchus or lung: Secondary | ICD-10-CM

## 2015-04-02 DIAGNOSIS — Z66 Do not resuscitate: Secondary | ICD-10-CM

## 2015-04-02 DIAGNOSIS — F319 Bipolar disorder, unspecified: Secondary | ICD-10-CM

## 2015-04-02 DIAGNOSIS — J449 Chronic obstructive pulmonary disease, unspecified: Secondary | ICD-10-CM

## 2015-04-02 DIAGNOSIS — E039 Hypothyroidism, unspecified: Secondary | ICD-10-CM

## 2015-04-02 LAB — COMPREHENSIVE METABOLIC PANEL (CC13)
ALK PHOS: 64 U/L (ref 40–150)
ALT: <9 U/L (ref 0–55)
ANION GAP: 7 meq/L (ref 3–11)
AST: 11 U/L (ref 5–34)
Albumin: 2.8 g/dL — ABNORMAL LOW (ref 3.5–5.0)
BUN: 8.2 mg/dL (ref 7.0–26.0)
CALCIUM: 9.1 mg/dL (ref 8.4–10.4)
CHLORIDE: 106 meq/L (ref 98–109)
CO2: 26 mEq/L (ref 22–29)
CREATININE: 0.6 mg/dL (ref 0.6–1.1)
EGFR: >90 mL/min/{1.73_m2} (ref >90)
Glucose: 84 mg/dl (ref 70–140)
POTASSIUM: 4 meq/L (ref 3.5–5.1)
Sodium: 139 mEq/L (ref 136–145)
Total Bilirubin: <0.3 mg/dL (ref 0.20–1.20)
Total Protein: 6 g/dL — ABNORMAL LOW (ref 6.4–8.3)

## 2015-04-02 LAB — CBC WITH DIFFERENTIAL/PLATELET
BASO%: 0.7 % (ref 0.0–2.0)
BASOS ABS: 0 10*3/uL (ref 0.0–0.1)
EOS ABS: 0.1 10*3/uL (ref 0.0–0.5)
EOS%: 2 % (ref 0.0–7.0)
HEMATOCRIT: 35.6 % (ref 34.8–46.6)
HGB: 11.8 g/dL (ref 11.6–15.9)
LYMPH#: 1 10*3/uL (ref 0.9–3.3)
LYMPH%: 16.7 % (ref 14.0–49.7)
MCH: 31.6 pg (ref 25.1–34.0)
MCHC: 33.1 g/dL (ref 31.5–36.0)
MCV: 95.4 fL (ref 79.5–101.0)
MONO#: 0.5 10*3/uL (ref 0.1–0.9)
MONO%: 8.1 % (ref 0.0–14.0)
NEUT#: 4.4 10*3/uL (ref 1.5–6.5)
NEUT%: 72.5 % (ref 38.4–76.8)
PLATELETS: 406 10*3/uL — AB (ref 145–400)
RBC: 3.73 10*6/uL (ref 3.70–5.45)
RDW: 16.5 % — ABNORMAL HIGH (ref 11.2–14.5)
WBC: 6.1 10*3/uL (ref 3.9–10.3)

## 2015-04-02 LAB — TSH CHCC: TSH: 1.098 m[IU]/L (ref 0.308–3.960)

## 2015-04-02 MED ORDER — PROMETHAZINE HCL 25 MG PO TABS
25.0000 mg | ORAL_TABLET | Freq: Three times a day (TID) | ORAL | Status: DC | PRN
Start: 2015-04-02 — End: 2015-04-18

## 2015-04-02 NOTE — Progress Notes (Signed)
[place] [phone]   [phone]  OFFICE PROGRESS NOTE  Shirline Frees, MD [place] [id number]  PRINCIPAL DIAGNOSIS: Metastatic non-small cell lung cancer initially diagnosed as Unresectable stage IIB (T3 N0 M0) non-small cell lung cancer, adenocarcinoma diagnosed in June 2011.   PRIOR THERAPY:  1. Status post concurrent chemoradiation with weekly carboplatin and paclitaxel. Last dose was given February 24, 2010. 2. Consolidation chemotherapy with carboplatin and Alimta, status post 3 cycles. Last dose was given June 09, 2010. 3. Curative radiotherapy to a right lower lobe nodule under the care of Dr. Pablo Ledger completed 04/20/2012. 4. Systemic chemotherapy with carboplatin for AUC of 5 and Alimta 500 mg/M2 every 3 weeks. First dose 01/24/2014. Status post 6 cycles with partial response. 5. Immunotherapy with Nivolumab 3 MG/KG every 2 weeks. First cycle expected on 01/22/2015. Status post 3 cycles. Discontinued today secondary to disease progression.   CURRENT THERAPY: Palliative care.  INTERVAL HISTORY: Maria Lucero 64 y.o. female returns to the clinic today for followup visit accompanied her husband and daughter. She has been on treatment with immunotherapy with Nivolumab for 3 cycles. The patient has rough time with her treatment and she does not feel that she can come regularly for her appointment. She has been complaining of increasing fatigue and weakness as well as few episodes of nausea and vomiting. She is using her pain medication more frequently. She is not interested in continuing treatment at this point. She has shortness of breath at baseline and increased with exertion and she is currently on home oxygen. She denied having any significant chest pain, cough or hemoptysis. She has no fever or chills, no nausea or vomiting. She had repeat CT scan of the chest performed recently and she is here for  evaluation and discussion of her scan results.  MEDICAL HISTORY: Past Medical History  Diagnosis Date  . Migraine headache   . Anxiety   . Urticaria   . Low back pain syndrome   . Hyperlipidemia   . Hypercholesterolemia   . Depression   . Tobacco abuse   . Myalgia and myositis, unspecified   . Bipolar 1 disorder ([place])   . Allergy   . Asthma     per pt stated  . Blood transfusion     several x  . Cataract     recent surgery b/l  . Arthritis   . History of radiation therapy 04/11/12; 04/13/12; 04/15/12; 04/18/12; & 04/20/12    Rll lung 60Gy/5/fx  . GERD (gastroesophageal reflux disease)   . COPD (chronic obstructive pulmonary disease) (HCC)     home o2 at 2l/min  . Shortness of breath     home o2 at 2l/min  . Complication of anesthesia     low oxygen  . PONV (postoperative nausea and vomiting)   . Fracture of humerus, proximal, left, closed 06/16/2013  . lung ca dx'd 11/2009    chemo comp 05/2010,home o2 at 2l/min  . Lung cancer ([place])     invasive left upper lobe adenocarcinoma    ALLERGIES:  is allergic to methocarbamol; morphine sulfate er beads; aspirin; avinza; clarithromycin; codeine; lyrica; oxycontin; phenergan; and levaquin.  MEDICATIONS:  Current Outpatient Prescriptions  Medication Sig Dispense Refill  . albuterol (PROVENTIL) (2.5 MG/3ML) 0.083% nebulizer solution Take 2.5 mg by nebulization every 6 (six) hours as needed for wheezing.    . clonazePAM (KLONOPIN) 1 MG tablet Take 1 mg by mouth  at bedtime.     . clorazepate (TRANXENE) 3.75 MG tablet Take 3.75 mg by mouth 3 (three) times daily as needed for anxiety.    . CRESTOR 5 MG tablet Take 5 mg by mouth daily.    . divalproex (DEPAKOTE) 250 MG DR tablet Take 750 mg by mouth at bedtime.    . folic acid (FOLVITE) 1 MG tablet Take 1 tablet (1 mg total) by mouth daily. 30 tablet 4  . hydroxychloroquine (PLAQUENIL) 200 MG tablet Take 200 mg by mouth 2 (two) times daily.     Marland Kitchen levothyroxine (SYNTHROID,  LEVOTHROID) 25 MCG tablet Take 12.5 mcg by mouth daily before breakfast.    . ondansetron (ZOFRAN) 8 MG tablet Take 1 tablet (8 mg total) by mouth every 8 (eight) hours as needed for nausea or vomiting. 30 tablet 1  . oxyCODONE-acetaminophen (PERCOCET) 10-325 MG per tablet Take 1-2 tablets by mouth every 6 (six) hours as needed for pain.    Marland Kitchen prochlorperazine (COMPAZINE) 10 MG tablet Take 1 tablet (10 mg total) by mouth every 6 (six) hours as needed for nausea or vomiting. 30 tablet 0  . Sennosides-Docusate Sodium (STOOL SOFTENER & LAXATIVE PO) Take 1 tablet by mouth daily.    Marland Kitchen triamcinolone cream (KENALOG) 0.1 % Apply 1 application topically 2 (two) times daily as needed (for itching).     . [DISCONTINUED] lisinopril-hydrochlorothiazide (PRINZIDE,ZESTORETIC) 10-12.5 MG per tablet Take 1 tablet by mouth.      No current facility-administered medications for this visit.    SURGICAL HISTORY:  Past Surgical History  Procedure Laterality Date  . Back surgery      x 4 L3-S1 1996,2000, 2002  . Vesicovaginal fistula closure w/ tah    . Appendectomy    . Bronchoscopy  2011  . Lung biopsy  2011    needle  . Tonsillectomy    . Abdominal hysterectomy      with b/l oophoorectomy age 3  . Lumbar fusion    . Orif humerus fracture Left 06/16/2013    Procedure: OPEN REDUCTION INTERNAL FIXATION (ORIF) LEFT SHOULDER;  Surgeon: Johnny Bridge, MD;  Location: [facility];  Service: Orthopedics;  Laterality: Left;    REVIEW OF SYSTEMS:  Constitutional: positive for anorexia and fatigue Eyes: negative Ears, nose, mouth, throat, and face: negative Respiratory: positive for cough and dyspnea on exertion Cardiovascular: negative Gastrointestinal: positive for nausea and vomiting Genitourinary:negative Integument/breast: negative Hematologic/lymphatic: negative Musculoskeletal:negative Neurological: negative Behavioral/Psych: negative Endocrine: negative Allergic/Immunologic: negative   PHYSICAL  EXAMINATION: General appearance: alert, cooperative, fatigued and no distress Head: Normocephalic, without obvious abnormality, atraumatic Neck: no adenopathy Lymph nodes: Cervical, supraclavicular, and axillary nodes normal. Resp: clear to auscultation bilaterally Back: symmetric, no curvature. ROM normal. No CVA tenderness. Cardio: regular rate and rhythm, S1, S2 normal, no murmur, click, rub or gallop GI: soft, non-tender; bowel sounds normal; no masses,  no organomegaly Extremities: extremities normal, atraumatic, no cyanosis or edema Neurologic: Alert and oriented X 3, normal strength and tone. Normal symmetric reflexes. Normal coordination and gait  ECOG PERFORMANCE STATUS: 2 - Symptomatic, <50% confined to bed  Blood pressure 127/66, pulse 103, temperature 98.3 F (36.8 C), temperature source Oral, resp. rate 18, height '5\' 3"'$  (1.6 m), weight 122 lb (55.339 kg), SpO2 96 %.  LABORATORY DATA: Lab Results  Component Value Date   WBC 6.1 04/02/2015   HGB 11.8 04/02/2015   HCT 35.6 04/02/2015   MCV 95.4 04/02/2015   PLT 406* 04/02/2015  Chemistry      Component Value Date/Time   NA 139 04/02/2015 1123   NA 145 06/28/2013 0443   NA 145 08/24/2011 1430   K 4.0 04/02/2015 1123   K 3.8 06/28/2013 0443   K 4.3 08/24/2011 1430   CL 101 06/28/2013 0443   CL 101 06/20/2012 1119   CL 95* 08/24/2011 1430   CO2 26 04/02/2015 1123   CO2 38* 06/28/2013 0443   CO2 31 08/24/2011 1430   BUN 8.2 04/02/2015 1123   BUN 18 06/28/2013 0443   BUN 15 08/24/2011 1430   CREATININE 0.6 04/02/2015 1123   CREATININE 0.59 06/28/2013 0443   CREATININE 0.8 08/24/2011 1430      Component Value Date/Time   CALCIUM 9.1 04/02/2015 1123   CALCIUM 7.8* 06/28/2013 0443   CALCIUM 8.6 08/24/2011 1430   ALKPHOS 64 04/02/2015 1123   ALKPHOS 71 08/24/2011 1430   ALKPHOS 98 06/28/2010 0600   AST 11 04/02/2015 1123   AST 15 08/24/2011 1430   AST 14 06/28/2010 0600   ALT <9 04/02/2015 1123   ALT 7*  08/24/2011 1430   ALT 10 06/28/2010 0600   BILITOT <0.30 04/02/2015 1123   BILITOT 0.50 08/24/2011 1430   BILITOT 0.4 06/28/2010 0600       RADIOGRAPHIC STUDIES: Ct Chest W Contrast  03/29/2015   CLINICAL DATA:  64 year old female with history of lung cancer with metastatic disease to both lungs. Intermittent chest pain. Radiation therapy completed in 2013. Chemotherapy completed in 2016.  EXAM: CT CHEST WITH CONTRAST  TECHNIQUE: Multidetector CT imaging of the chest was performed during intravenous contrast administration.  CONTRAST:  74m OMNIPAQUE IOHEXOL 300 MG/ML  SOLN  COMPARISON:  Chest CT 12/11/2014.  FINDINGS: Mediastinum/Lymph Nodes: Heart size is normal. Small amount of pericardial fluid and/or thickening, similar to the prior study, and unlikely to be of any hemodynamic significance at this time. No pathologically enlarged mediastinal or hilar lymph nodes. Esophagus is unremarkable in appearance. No axillary lymphadenopathy.  Lungs/Pleura: Chronic changes of postradiation mass-like fibrosis are again noted in the upper left hemithorax, involving portions of the left upper lobe as well as the superior segment of the left lower lobe. Numerous right-sided pulmonary nodules and masses generally appear increased in size compared to the prior examination, including the largest mass in the right lower lobe posteriorly which currently measures 4.8 x 2.8 cm (previously 4.0 x 2.7 cm on 12/11/2014). Multiple other smaller pulmonary nodules also appear significantly increased in size as well. No definite new nodules are noted. Diffuse bronchial wall thickening with severe centrilobular emphysema. No acute consolidative airspace disease. Chronic pleural thickening in the upper left hemithorax is unchanged. No pleural effusions.  Upper Abdomen: Unremarkable.  Musculoskeletal/Soft Tissues: Posterior rod and pedicle screw fixation device in the lower thoracic spine incompletely visualized. Status post ORIF in  the left proximal humerus. 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 There are no aggressive appearing lytic or blastic lesions noted in the visualized portions of the skeleton.  IMPRESSION: 1. Today's study demonstrates progression of disease with interval enlargement of numerous right-sided pulmonary nodules and masses, as discussed above. 2. Stable postradiation changes of mass-like radiation fibrosis in the upper left hemithorax, similar to prior studies. 3. Additional incidental findings, as above, similar to prior examination.   Electronically Signed   By: DMauri BrooklynD.  On: 03/29/2015 13:50   ASSESSMENT AND PLAN: This is a very pleasant 65 years old white female with metastatic non-small cell lung cancer initially diagnosed as unresectable stage IIB non-small cell lung cancer status post concurrent chemoradiation followed by consolidation chemotherapy. The patient had evidence for disease progression and she was started on systemic chemotherapy with carboplatin and Alimta status post 6 cycles. She has been observation for around 10 months but unfortunately the recent CT scan of the chest showed evidence for disease progression. The patient was started on treatment with immunotherapy with Nivolumab status post 3 cycles. Unfortunately she has rough time coming to the clinic for her treatment but also the recent CT scan of the chest showed significant disease progression. I had a lengthy discussion with the patient and her family about her current disease status and treatment options. Unfortunately the patient has severe COPD and her condition is declining. Advancing she would be a good candidate for any further systemic treatment at this point. I recommended for the patient palliative care and hospice referral but she does not feel  ready for hospice at this point. She would like to continue on observation and pain management for now. She will call when she is ready for the hospice referral. I also discussed with the patient and her family her CODE STATUS and the patient agreed to no CODE BLUE. I will see the patient on as-needed basis at this point. The patient was advised to call immediately if she has any concerning symptoms in the interval. All questions were answered. The patient knows to call the clinic with any problems, questions or concerns. We can certainly see the patient much sooner if necessary. The patient and her family agreed to the current plan.  Disclaimer: This note was dictated with voice recognition software. Similar sounding words can inadvertently be transcribed and may not be corrected upon review.

## 2015-04-18 ENCOUNTER — Other Ambulatory Visit: Payer: Self-pay | Admitting: Internal Medicine

## 2015-05-09 ENCOUNTER — Other Ambulatory Visit: Payer: Self-pay | Admitting: *Deleted

## 2015-05-09 DIAGNOSIS — C3430 Malignant neoplasm of lower lobe, unspecified bronchus or lung: Secondary | ICD-10-CM

## 2015-05-09 MED ORDER — PROCHLORPERAZINE MALEATE 10 MG PO TABS: 10.0000 mg | ORAL_TABLET | Freq: Four times a day (QID) | ORAL | Status: AC | PRN

## 2015-05-10 ENCOUNTER — Ambulatory Visit (HOSPITAL_BASED_OUTPATIENT_CLINIC_OR_DEPARTMENT_OTHER): Payer: Medicare Other | Admitting: Nurse Practitioner

## 2015-05-10 ENCOUNTER — Ambulatory Visit (HOSPITAL_BASED_OUTPATIENT_CLINIC_OR_DEPARTMENT_OTHER): Payer: Medicare Other

## 2015-05-10 ENCOUNTER — Other Ambulatory Visit: Payer: Self-pay

## 2015-05-10 ENCOUNTER — Telehealth: Payer: Self-pay

## 2015-05-10 ENCOUNTER — Telehealth: Payer: Self-pay | Admitting: *Deleted

## 2015-05-10 VITALS — BP 94/48 | Temp 98.4°F | Resp 22

## 2015-05-10 DIAGNOSIS — E8809 Other disorders of plasma-protein metabolism, not elsewhere classified: Secondary | ICD-10-CM | POA: Diagnosis not present

## 2015-05-10 DIAGNOSIS — C3430 Malignant neoplasm of lower lobe, unspecified bronchus or lung: Secondary | ICD-10-CM

## 2015-05-10 DIAGNOSIS — R110 Nausea: Secondary | ICD-10-CM

## 2015-05-10 DIAGNOSIS — E46 Unspecified protein-calorie malnutrition: Secondary | ICD-10-CM

## 2015-05-10 DIAGNOSIS — C3431 Malignant neoplasm of lower lobe, right bronchus or lung: Secondary | ICD-10-CM | POA: Diagnosis not present

## 2015-05-10 DIAGNOSIS — E039 Hypothyroidism, unspecified: Secondary | ICD-10-CM | POA: Diagnosis not present

## 2015-05-10 DIAGNOSIS — R530 Neoplastic (malignant) related fatigue: Secondary | ICD-10-CM

## 2015-05-10 LAB — CBC WITH DIFFERENTIAL/PLATELET
BASO%: 0.5 % (ref 0.0–2.0)
Basophils Absolute: 0.1 10*3/uL (ref 0.0–0.1)
EOS ABS: 0.1 10*3/uL (ref 0.0–0.5)
EOS%: 0.6 % (ref 0.0–7.0)
HCT: 35 % (ref 34.8–46.6)
HEMOGLOBIN: 11.3 g/dL — AB (ref 11.6–15.9)
LYMPH%: 5.8 % — AB (ref 14.0–49.7)
MCH: 31.1 pg (ref 25.1–34.0)
MCHC: 32.4 g/dL (ref 31.5–36.0)
MCV: 96.1 fL (ref 79.5–101.0)
MONO#: 0.7 10*3/uL (ref 0.1–0.9)
MONO%: 6.5 % (ref 0.0–14.0)
NEUT%: 86.6 % — ABNORMAL HIGH (ref 38.4–76.8)
NEUTROS ABS: 9.7 10*3/uL — AB (ref 1.5–6.5)
PLATELETS: 392 10*3/uL (ref 145–400)
RBC: 3.64 10*6/uL — AB (ref 3.70–5.45)
RDW: 15.7 % — AB (ref 11.2–14.5)
WBC: 11.2 10*3/uL — AB (ref 3.9–10.3)
lymph#: 0.6 10*3/uL — ABNORMAL LOW (ref 0.9–3.3)

## 2015-05-10 LAB — COMPREHENSIVE METABOLIC PANEL (CC13)
ALBUMIN: 2.9 g/dL — AB (ref 3.5–5.0)
ALK PHOS: 67 U/L (ref 40–150)
AST: 8 U/L (ref 5–34)
Anion Gap: 13 mEq/L — ABNORMAL HIGH (ref 3–11)
BILIRUBIN TOTAL: 0.35 mg/dL (ref 0.20–1.20)
BUN: 7.8 mg/dL (ref 7.0–26.0)
CO2: 24 meq/L (ref 22–29)
CREATININE: 0.6 mg/dL (ref 0.6–1.1)
Calcium: 9.4 mg/dL (ref 8.4–10.4)
Chloride: 105 mEq/L (ref 98–109)
EGFR: >90 mL/min/{1.73_m2} (ref >90)
GLUCOSE: 84 mg/dL (ref 70–140)
Potassium: 3.5 mEq/L (ref 3.5–5.1)
SODIUM: 141 meq/L (ref 136–145)
TOTAL PROTEIN: 6.2 g/dL — AB (ref 6.4–8.3)

## 2015-05-10 LAB — TSH CHCC: TSH: 0.33 m[IU]/L (ref 0.308–3.960)

## 2015-05-10 MED ORDER — PROMETHAZINE HCL 25 MG PO TABS: ORAL_TABLET | ORAL | Status: DC

## 2015-05-10 MED ORDER — OXYCODONE-ACETAMINOPHEN 5-325 MG PO TABS
ORAL_TABLET | ORAL | Status: AC
  Filled 2015-05-10: qty 2

## 2015-05-10 MED ORDER — OXYCODONE-ACETAMINOPHEN 5-325 MG PO TABS
2.0000 | ORAL_TABLET | Freq: Once | ORAL | Status: AC
  Administered 2015-05-10: 2 via ORAL

## 2015-05-10 MED ORDER — SODIUM CHLORIDE 0.9 % IV SOLN
Freq: Once | INTRAVENOUS | Status: AC
  Administered 2015-05-10: 15:00:00 via INTRAVENOUS
  Filled 2015-05-10: qty 4

## 2015-05-10 MED ORDER — ONDANSETRON HCL 8 MG PO TABS: 8.0000 mg | ORAL_TABLET | Freq: Three times a day (TID) | ORAL | Status: AC | PRN

## 2015-05-10 MED ORDER — SODIUM CHLORIDE 0.9 % IV SOLN
Freq: Once | INTRAVENOUS | Status: AC
  Administered 2015-05-10: 15:00:00 via INTRAVENOUS

## 2015-05-10 NOTE — Telephone Encounter (Signed)
Pt husband called requesting a hospice referral and help with managing nausea.  Husband reports constant nausea and 8-9 time vomiting /day.  Armanda Magic, RN to make referral to hospice.  Lynnda Shields to see pt in infusion. Lab orders entered and IVF/anti-emetics ordered signed and held.  Pt notified of appt time.

## 2015-05-10 NOTE — Telephone Encounter (Signed)
Call from Triage Rn pt and family requesting hospice. Referral entered.  Pt c/o of nausea/vomit, appt will be made for pt to be seen at [facility] and possible fluids.

## 2015-05-10 NOTE — Addendum Note (Signed)
Addended by: Lucile Crater on: 05/10/2015 04:39 PM   Modules accepted: Orders

## 2015-05-13 ENCOUNTER — Encounter: Payer: Self-pay | Admitting: Nurse Practitioner

## 2015-05-13 ENCOUNTER — Other Ambulatory Visit: Payer: Self-pay | Admitting: Medical Oncology

## 2015-05-13 DIAGNOSIS — E46 Unspecified protein-calorie malnutrition: Secondary | ICD-10-CM | POA: Insufficient documentation

## 2015-05-13 NOTE — Progress Notes (Signed)
SYMPTOM MANAGEMENT CLINIC   HPI: Maria Lucero 64 y.o. female diagnosed with lung cancer.  Patient is status post chemotherapy in the past; and 3 cycles of Nivolumab immunotherapy.   Patient is status post chemotherapy; and received 3 cycles total of Nivolumab immunotherapy prior to restaging scan which showed continued progression of her disease.  Patient called the [facility] earlier this morning complaining of chronic nausea/vomiting and subsequent dehydration.  She also has made the decision for hospice care today as well.  Blood counts obtained today revealed a PVC 11.2, ANC 9.7, hemoglobin 11.3, platelet count 392.  Dr. Worthy Flank nurse will make the hospice referral today for the patient.  There is no further follow-up scheduled here at the [facility] for the time being.  HPI  ROS  Past Medical History  Diagnosis Date  . Migraine headache   . Anxiety   . Urticaria   . Low back pain syndrome   . Hyperlipidemia   . Hypercholesterolemia   . Depression   . Tobacco abuse   . Myalgia and myositis, unspecified   . Bipolar 1 disorder ([place])   . Allergy   . Asthma     per pt stated  . Blood transfusion     several x  . Cataract     recent surgery b/l  . Arthritis   . History of radiation therapy 04/11/12; 04/13/12; 04/15/12; 04/18/12; & 04/20/12    Rll lung 60Gy/5/fx  . GERD (gastroesophageal reflux disease)   . COPD (chronic obstructive pulmonary disease) (HCC)     home o2 at 2l/min  . Shortness of breath     home o2 at 2l/min  . Complication of anesthesia     low oxygen  . PONV (postoperative nausea and vomiting)   . Fracture of humerus, proximal, left, closed 06/16/2013  . lung ca dx'd 11/2009    chemo comp 05/2010,home o2 at 2l/min  . Lung cancer ([place])     invasive left upper lobe adenocarcinoma    Past Surgical History  Procedure Laterality Date  . Back surgery      x 4 L3-S1 1996,2000, 2002  . Vesicovaginal fistula closure w/ tah    .  Appendectomy    . Bronchoscopy  2011  . Lung biopsy  2011    needle  . Tonsillectomy    . Abdominal hysterectomy      with b/l oophoorectomy age 37  . Lumbar fusion    . Orif humerus fracture Left 06/16/2013    Procedure: OPEN REDUCTION INTERNAL FIXATION (ORIF) LEFT SHOULDER;  Surgeon: Johnny Bridge, MD;  Location: [facility];  Service: Orthopedics;  Laterality: Left;    has HYPERCHOLESTEROLEMIA; HYPERLIPIDEMIA; DEPRESSION, MAJOR, RECURRENT; ANXIETY; MIGRAINE HEADACHE; COPD with exacerbation ([place]); URTICARIA; LOW BACK PAIN SYNDROME; UNSPECIFIED MYALGIA AND MYOSITIS; HEMOPTYSIS UNSPECIFIED; MASS, LUNG; Lung cancer, lower lobe (HCC); INSOMNIA; Fracture of humerus, proximal, left, closed; Fracture, humerus, proximal; Acute exacerbation of chronic obstructive pulmonary disease (COPD) ([place]); History of shoulder surgery; Chronic pain; Chronic nausea; Acute respiratory failure with hypercapnia ([place]); RLL pneumonia; HTN (hypertension); Fatigue; Dyspnea; Nausea without vomiting; Hypothyroid; DNR no code (do not resuscitate); and Hypoalbuminemia due to protein-calorie malnutrition ([place]) on her problem list.    is allergic to methocarbamol; morphine sulfate er beads; aspirin; avinza; clarithromycin; codeine; lyrica; oxycontin; phenergan; and levaquin.    Medication List       This list is accurate as of: 05/10/15 11:59 PM.  Always use your most recent med list.  albuterol (2.5 MG/3ML) 0.083% nebulizer solution  Commonly known as:  PROVENTIL  Take 2.5 mg by nebulization every 6 (six) hours as needed for wheezing.     clonazePAM 1 MG tablet  Commonly known as:  KLONOPIN  Take 1 mg by mouth at bedtime.     clorazepate 3.75 MG tablet  Commonly known as:  TRANXENE  Take 3.75 mg by mouth 3 (three) times daily as needed for anxiety.     CRESTOR 5 MG tablet  Generic drug:  rosuvastatin  Take 5 mg by mouth daily.     divalproex 250 MG DR tablet  Commonly known as:  DEPAKOTE  Take  750 mg by mouth at bedtime.     folic acid 1 MG tablet  Commonly known as:  FOLVITE  Take 1 tablet (1 mg total) by mouth daily.     hydroxychloroquine 200 MG tablet  Commonly known as:  PLAQUENIL  Take 200 mg by mouth 2 (two) times daily.     levothyroxine 25 MCG tablet  Commonly known as:  SYNTHROID, LEVOTHROID  Take 12.5 mcg by mouth daily before breakfast.     ondansetron 8 MG tablet  Commonly known as:  ZOFRAN  Take 1 tablet (8 mg total) by mouth every 8 (eight) hours as needed for nausea or vomiting.     oxyCODONE-acetaminophen 10-325 MG tablet  Commonly known as:  PERCOCET  Take 1-2 tablets by mouth every 6 (six) hours as needed for pain.     prochlorperazine 10 MG tablet  Commonly known as:  COMPAZINE  Take 1 tablet (10 mg total) by mouth every 6 (six) hours as needed for nausea or vomiting.     promethazine 25 MG tablet  Commonly known as:  PHENERGAN  TAKE ONE TABLET BY MOUTH EVERY 8 HOURS AS NEEDED FOR NAUSEA AND VOMITING     STOOL SOFTENER & LAXATIVE PO  Take 1 tablet by mouth daily.     triamcinolone cream 0.1 %  Commonly known as:  KENALOG  Apply 1 application topically 2 (two) times daily as needed (for itching).         PHYSICAL EXAMINATION  Oncology Vitals 05/10/2015 04/02/2015  Height - 160 cm  Weight - 55.339 kg  Weight (lbs) - 122 lbs  BMI (kg/m2) - 21.61 kg/m2  Temp 98.4 98.3  Pulse - 103  Resp 22 18  Resp (Historical as of 01/28/12) - -  SpO2 - 96  BSA (m2) - 1.57 m2   BP Readings from Last 2 Encounters:  05/10/15 94/48  04/02/15 127/66    Physical Exam  Constitutional: She is oriented to person, place, and time. She appears malnourished and dehydrated. She appears unhealthy. She appears cachectic.  HENT:  Head: Normocephalic and atraumatic.  Mouth/Throat: Oropharynx is clear and moist.  Eyes: Conjunctivae and EOM are normal. Pupils are equal, round, and reactive to light. Right eye exhibits no discharge. Left eye exhibits no discharge.  No scleral icterus.  Neck: Normal range of motion. Neck supple. No JVD present. No tracheal deviation present. No thyromegaly present.  Cardiovascular: Normal rate, regular rhythm, normal heart sounds and intact distal pulses.   Pulmonary/Chest: Effort normal and breath sounds normal. No respiratory distress. She has no wheezes. She has no rales. She exhibits no tenderness.  Abdominal: Soft. Bowel sounds are normal. She exhibits no distension and no mass. There is no tenderness. There is no rebound and no guarding.  Musculoskeletal: Normal range of motion. She exhibits no edema or  tenderness.  Lymphadenopathy:    She has no cervical adenopathy.  Neurological: She is alert and oriented to person, place, and time.  Skin: Skin is warm and dry. No rash noted. No erythema. There is pallor.  Psychiatric: Affect normal.  Nursing note and vitals reviewed.   LABORATORY DATA:. Appointment on 05/10/2015  Component Date Value Ref Range Status  . TSH 05/10/2015 0.330  0.308 - 3.960 m(IU)/L Final  . WBC 05/10/2015 11.2* 3.9 - 10.3 10e3/uL Final  . NEUT# 05/10/2015 9.7* 1.5 - 6.5 10e3/uL Final  . HGB 05/10/2015 11.3* 11.6 - 15.9 g/dL Final  . HCT 05/10/2015 35.0  34.8 - 46.6 % Final  . Platelets 05/10/2015 392  145 - 400 10e3/uL Final  . MCV 05/10/2015 96.1  79.5 - 101.0 fL Final  . MCH 05/10/2015 31.1  25.1 - 34.0 pg Final  . MCHC 05/10/2015 32.4  31.5 - 36.0 g/dL Final  . RBC 05/10/2015 3.64* 3.70 - 5.45 10e6/uL Final  . RDW 05/10/2015 15.7* 11.2 - 14.5 % Final  . lymph# 05/10/2015 0.6* 0.9 - 3.3 10e3/uL Final  . MONO# 05/10/2015 0.7  0.1 - 0.9 10e3/uL Final  . Eosinophils Absolute 05/10/2015 0.1  0.0 - 0.5 10e3/uL Final  . Basophils Absolute 05/10/2015 0.1  0.0 - 0.1 10e3/uL Final  . NEUT% 05/10/2015 86.6* 38.4 - 76.8 % Final  . LYMPH% 05/10/2015 5.8* 14.0 - 49.7 % Final  . MONO% 05/10/2015 6.5  0.0 - 14.0 % Final  . EOS% 05/10/2015 0.6  0.0 - 7.0 % Final  . BASO% 05/10/2015 0.5  0.0 - 2.0 %  Final  . Sodium 05/10/2015 141  136 - 145 mEq/L Final  . Potassium 05/10/2015 3.5  3.5 - 5.1 mEq/L Final  . Chloride 05/10/2015 105  98 - 109 mEq/L Final  . CO2 05/10/2015 24  22 - 29 mEq/L Final  . Glucose 05/10/2015 84  70 - 140 mg/dl Final   Glucose reference range is for nonfasting patients. Fasting glucose reference range is 70- 100.  Marland Kitchen BUN 05/10/2015 7.8  7.0 - 26.0 mg/dL Final  . Creatinine 05/10/2015 0.6  0.6 - 1.1 mg/dL Final  . Total Bilirubin 05/10/2015 0.35  0.20 - 1.20 mg/dL Final  . Alkaline Phosphatase 05/10/2015 67  40 - 150 U/L Final  . AST 05/10/2015 8  5 - 34 U/L Final  . ALT 05/10/2015 <9  0 - 55 U/L Final  . Total Protein 05/10/2015 6.2* 6.4 - 8.3 g/dL Final  . Albumin 05/10/2015 2.9* 3.5 - 5.0 g/dL Final  . Calcium 05/10/2015 9.4  8.4 - 10.4 mg/dL Final  . Anion Gap 05/10/2015 13* 3 - 11 mEq/L Final  . EGFR 05/10/2015 >90  >90 ml/min/1.73 m2 Final   eGFR is calculated using the CKD-EPI Creatinine Equation (2009)     RADIOGRAPHIC STUDIES: No results found.  ASSESSMENT/PLAN:    Lung cancer, lower lobe Patient is status post chemotherapy; and received 3 cycles total of Nivolumab immunotherapy prior to restaging scan which showed continued progression of her disease.  Patient called the [facility] earlier this morning complaining of chronic nausea/vomiting and subsequent dehydration.  She also has made the decision for hospice care today as well.  Blood counts obtained today revealed a PVC 11.2, ANC 9.7, hemoglobin 11.3, platelet count 392.  Dr. Worthy Flank nurse will make the hospice referral today for the patient.  There is no further follow-up scheduled here at the [facility] for the time being.  Nausea without vomiting  Patient continues to complain of chronic nausea and vomiting.  She feels dehydrated today.  Patient received IV fluid rehydration while at the cancer Center today.  Also, patient has made the decision to become a hospice patient today.    Hypoalbuminemia due to protein-calorie malnutrition (HCC) Albumen is down to 2.9 today.  Patient was encouraged to push protein in her diet is much as possible.  Patient stated understanding of all instructions; and was in agreement with this plan of care. The patient knows to call the clinic with any problems, questions or concerns.   Review/collaboration with Dr. Julien Nordmann regarding all aspects of patient's visit today.   Total time spent with patient was 25 minutes;  with greater than 75 percent of that time spent in face to face counseling regarding patient's symptoms,  and coordination of care and follow up.  Disclaimer:This dictation was prepared with Dragon/digital dictation along with [employer]. Any transcriptional errors that result from this process are unintentional.  Drue Second, NP 05/13/2015

## 2015-05-13 NOTE — Assessment & Plan Note (Signed)
Albumen is down to 2.9 today.  Patient was encouraged to push protein in her diet is much as possible.

## 2015-05-13 NOTE — Assessment & Plan Note (Signed)
Patient is status post chemotherapy; and received 3 cycles total of Nivolumab immunotherapy prior to restaging scan which showed continued progression of her disease.  Patient called the [facility] earlier this morning complaining of chronic nausea/vomiting and subsequent dehydration.  She also has made the decision for hospice care today as well.  Blood counts obtained today revealed a PVC 11.2, ANC 9.7, hemoglobin 11.3, platelet count 392.  Dr. Worthy Flank nurse will make the hospice referral today for the patient.  There is no further follow-up scheduled here at the [facility] for the time being.

## 2015-05-13 NOTE — Assessment & Plan Note (Signed)
Patient continues to complain of chronic nausea and vomiting.  She feels dehydrated today.  Patient received IV fluid rehydration while at the cancer Center today.  Also, patient has made the decision to become a hospice patient today.

## 2015-06-11 ENCOUNTER — Other Ambulatory Visit: Payer: Self-pay | Admitting: Internal Medicine

## 2015-06-12 ENCOUNTER — Encounter: Payer: Self-pay | Admitting: Internal Medicine

## 2015-06-12 NOTE — Progress Notes (Signed)
Faxed ondansetron pa form to [facility]

## 2015-06-25 ENCOUNTER — Encounter: Payer: Self-pay | Admitting: Internal Medicine

## 2015-06-25 NOTE — Progress Notes (Signed)
[employer] denied ondansetron prior auth due to patient being enrolled in Hospice, medicare part A should cover the medication.

## 2015-06-26 ENCOUNTER — Other Ambulatory Visit: Payer: Self-pay | Admitting: Nurse Practitioner

## 2015-10-10 ENCOUNTER — Other Ambulatory Visit: Payer: Self-pay | Admitting: Internal Medicine

## 2015-11-28 DEATH — deceased
